# Patient Record
Sex: Female | Born: 1935 | Race: White | Hispanic: No | Marital: Married | Smoking: Never smoker
Health system: Southern US, Community
[De-identification: ages and names within clinical notes are randomized; demographics above are authoritative.]

## PROBLEM LIST (undated history)

## (undated) DIAGNOSIS — G039 Meningitis, unspecified: Secondary | ICD-10-CM

## (undated) DIAGNOSIS — I10 Essential (primary) hypertension: Secondary | ICD-10-CM

## (undated) DIAGNOSIS — R413 Other amnesia: Secondary | ICD-10-CM

## (undated) DIAGNOSIS — R001 Bradycardia, unspecified: Secondary | ICD-10-CM

## (undated) DIAGNOSIS — I493 Ventricular premature depolarization: Secondary | ICD-10-CM

## (undated) DIAGNOSIS — K219 Gastro-esophageal reflux disease without esophagitis: Secondary | ICD-10-CM

## (undated) DIAGNOSIS — E539 Vitamin B deficiency, unspecified: Secondary | ICD-10-CM

## (undated) DIAGNOSIS — D509 Iron deficiency anemia, unspecified: Secondary | ICD-10-CM

## (undated) DIAGNOSIS — R7303 Prediabetes: Secondary | ICD-10-CM

## (undated) DIAGNOSIS — E876 Hypokalemia: Secondary | ICD-10-CM

## (undated) DIAGNOSIS — Z789 Other specified health status: Secondary | ICD-10-CM

## (undated) DIAGNOSIS — M1990 Unspecified osteoarthritis, unspecified site: Secondary | ICD-10-CM

## (undated) HISTORY — DX: Gastro-esophageal reflux disease without esophagitis: K21.9

## (undated) HISTORY — PX: CHOLECYSTECTOMY: SHX55

## (undated) HISTORY — DX: Iron deficiency anemia, unspecified: D50.9

## (undated) HISTORY — DX: Ventricular premature depolarization: I49.3

## (undated) HISTORY — PX: ABDOMINAL HYSTERECTOMY: SHX81

## (undated) HISTORY — DX: Essential (primary) hypertension: I10

## (undated) HISTORY — DX: Prediabetes: R73.03

## (undated) HISTORY — DX: Hypokalemia: E87.6

## (undated) HISTORY — DX: Vitamin B deficiency, unspecified: E53.9

## (undated) HISTORY — PX: JOINT REPLACEMENT: SHX530

## (undated) HISTORY — PX: NEPHROSTOMY: SHX1014

## (undated) HISTORY — PX: TONSILLECTOMY: SUR1361

## (undated) HISTORY — DX: Unspecified osteoarthritis, unspecified site: M19.90

## (undated) HISTORY — PX: BREAST SURGERY: SHX581

## (undated) HISTORY — PX: TOTAL ABDOMINAL HYSTERECTOMY W/ BILATERAL SALPINGOOPHORECTOMY: SHX83

## (undated) HISTORY — DX: Bradycardia, unspecified: R00.1

---

## 1999-03-18 ENCOUNTER — Emergency Department (HOSPITAL_COMMUNITY): Admission: EM | Admit: 1999-03-18 | Discharge: 1999-03-18 | Payer: Self-pay | Admitting: Emergency Medicine

## 1999-03-18 ENCOUNTER — Encounter: Payer: Self-pay | Admitting: Emergency Medicine

## 2002-09-07 ENCOUNTER — Encounter: Payer: Self-pay | Admitting: Orthopaedic Surgery

## 2002-09-10 ENCOUNTER — Inpatient Hospital Stay (HOSPITAL_COMMUNITY): Admission: RE | Admit: 2002-09-10 | Discharge: 2002-09-14 | Payer: Self-pay | Admitting: Orthopaedic Surgery

## 2009-01-13 ENCOUNTER — Encounter (HOSPITAL_COMMUNITY): Admission: RE | Admit: 2009-01-13 | Discharge: 2009-04-11 | Payer: Self-pay | Admitting: Rheumatology

## 2009-04-12 ENCOUNTER — Encounter (HOSPITAL_COMMUNITY): Admission: RE | Admit: 2009-04-12 | Discharge: 2009-07-11 | Payer: Self-pay | Admitting: Rheumatology

## 2009-07-19 ENCOUNTER — Encounter (HOSPITAL_COMMUNITY): Admission: RE | Admit: 2009-07-19 | Discharge: 2009-10-05 | Payer: Self-pay | Admitting: Rheumatology

## 2009-10-03 ENCOUNTER — Encounter (HOSPITAL_COMMUNITY): Admission: RE | Admit: 2009-10-03 | Discharge: 2009-10-04 | Payer: Self-pay | Admitting: Rheumatology

## 2009-11-19 ENCOUNTER — Emergency Department (HOSPITAL_COMMUNITY): Admission: EM | Admit: 2009-11-19 | Discharge: 2009-11-19 | Payer: Self-pay | Admitting: Emergency Medicine

## 2009-11-22 ENCOUNTER — Emergency Department (HOSPITAL_BASED_OUTPATIENT_CLINIC_OR_DEPARTMENT_OTHER): Admission: EM | Admit: 2009-11-22 | Discharge: 2009-11-22 | Payer: Self-pay | Admitting: Emergency Medicine

## 2009-11-22 ENCOUNTER — Ambulatory Visit: Payer: Self-pay | Admitting: Diagnostic Radiology

## 2009-11-22 ENCOUNTER — Inpatient Hospital Stay (HOSPITAL_COMMUNITY): Admission: AD | Admit: 2009-11-22 | Discharge: 2009-11-24 | Payer: Self-pay

## 2009-11-28 ENCOUNTER — Inpatient Hospital Stay (HOSPITAL_COMMUNITY): Admission: EM | Admit: 2009-11-28 | Discharge: 2009-12-13 | Payer: Self-pay | Admitting: Emergency Medicine

## 2009-12-01 ENCOUNTER — Ambulatory Visit: Payer: Self-pay | Admitting: Vascular Surgery

## 2009-12-01 ENCOUNTER — Ambulatory Visit: Payer: Self-pay | Admitting: Infectious Disease

## 2009-12-01 ENCOUNTER — Encounter (INDEPENDENT_AMBULATORY_CARE_PROVIDER_SITE_OTHER): Payer: Self-pay | Admitting: Internal Medicine

## 2009-12-02 ENCOUNTER — Ambulatory Visit: Payer: Self-pay | Admitting: Physical Medicine & Rehabilitation

## 2009-12-02 ENCOUNTER — Encounter: Payer: Self-pay | Admitting: Infectious Disease

## 2009-12-04 ENCOUNTER — Encounter (INDEPENDENT_AMBULATORY_CARE_PROVIDER_SITE_OTHER): Payer: Self-pay | Admitting: Internal Medicine

## 2009-12-07 ENCOUNTER — Encounter: Payer: Self-pay | Admitting: Infectious Disease

## 2009-12-13 ENCOUNTER — Encounter: Payer: Self-pay | Admitting: Infectious Disease

## 2009-12-13 ENCOUNTER — Ambulatory Visit: Payer: Self-pay | Admitting: Physical Medicine & Rehabilitation

## 2009-12-13 ENCOUNTER — Inpatient Hospital Stay (HOSPITAL_COMMUNITY)
Admission: RE | Admit: 2009-12-13 | Discharge: 2009-12-30 | Payer: Self-pay | Admitting: Physical Medicine & Rehabilitation

## 2009-12-15 ENCOUNTER — Ambulatory Visit (HOSPITAL_COMMUNITY): Admission: RE | Admit: 2009-12-15 | Discharge: 2009-12-15 | Payer: Self-pay | Admitting: Infectious Disease

## 2009-12-24 ENCOUNTER — Encounter: Payer: Self-pay | Admitting: Physical Medicine & Rehabilitation

## 2009-12-26 ENCOUNTER — Ambulatory Visit: Payer: Self-pay | Admitting: Psychology

## 2009-12-30 ENCOUNTER — Ambulatory Visit: Payer: Self-pay | Admitting: Infectious Disease

## 2010-01-02 ENCOUNTER — Telehealth: Payer: Self-pay | Admitting: Infectious Disease

## 2010-01-02 DIAGNOSIS — G0490 Encephalitis and encephalomyelitis, unspecified: Secondary | ICD-10-CM | POA: Insufficient documentation

## 2010-01-02 DIAGNOSIS — G8190 Hemiplegia, unspecified affecting unspecified side: Secondary | ICD-10-CM | POA: Insufficient documentation

## 2010-01-02 DIAGNOSIS — G0491 Myelitis, unspecified: Secondary | ICD-10-CM

## 2010-01-02 DIAGNOSIS — G009 Bacterial meningitis, unspecified: Secondary | ICD-10-CM | POA: Insufficient documentation

## 2010-01-03 ENCOUNTER — Telehealth: Payer: Self-pay | Admitting: Infectious Disease

## 2010-01-04 ENCOUNTER — Inpatient Hospital Stay (HOSPITAL_COMMUNITY): Admission: AD | Admit: 2010-01-04 | Discharge: 2010-01-18 | Payer: Self-pay | Admitting: Internal Medicine

## 2010-01-04 ENCOUNTER — Ambulatory Visit: Payer: Self-pay | Admitting: Internal Medicine

## 2010-01-07 ENCOUNTER — Ambulatory Visit: Payer: Self-pay | Admitting: Infectious Diseases

## 2010-01-18 ENCOUNTER — Encounter: Payer: Self-pay | Admitting: Infectious Disease

## 2010-01-25 ENCOUNTER — Encounter (INDEPENDENT_AMBULATORY_CARE_PROVIDER_SITE_OTHER): Payer: Self-pay | Admitting: *Deleted

## 2010-01-25 DIAGNOSIS — K219 Gastro-esophageal reflux disease without esophagitis: Secondary | ICD-10-CM

## 2010-01-25 DIAGNOSIS — M1990 Unspecified osteoarthritis, unspecified site: Secondary | ICD-10-CM | POA: Insufficient documentation

## 2010-01-25 DIAGNOSIS — F329 Major depressive disorder, single episode, unspecified: Secondary | ICD-10-CM

## 2010-01-26 ENCOUNTER — Ambulatory Visit: Payer: Self-pay | Admitting: Infectious Disease

## 2010-01-26 DIAGNOSIS — F0150 Vascular dementia without behavioral disturbance: Secondary | ICD-10-CM

## 2010-03-20 ENCOUNTER — Ambulatory Visit: Payer: Self-pay | Admitting: Infectious Disease

## 2010-03-20 DIAGNOSIS — F068 Other specified mental disorders due to known physiological condition: Secondary | ICD-10-CM | POA: Insufficient documentation

## 2010-03-20 DIAGNOSIS — R609 Edema, unspecified: Secondary | ICD-10-CM | POA: Insufficient documentation

## 2010-05-30 ENCOUNTER — Encounter: Admission: RE | Admit: 2010-05-30 | Discharge: 2010-05-30 | Payer: Self-pay | Admitting: Internal Medicine

## 2010-06-12 ENCOUNTER — Encounter: Admission: RE | Admit: 2010-06-12 | Discharge: 2010-06-12 | Payer: Self-pay | Admitting: Internal Medicine

## 2010-06-12 ENCOUNTER — Ambulatory Visit: Payer: Self-pay | Admitting: Internal Medicine

## 2010-06-13 ENCOUNTER — Encounter: Admission: RE | Admit: 2010-06-13 | Discharge: 2010-06-13 | Payer: Self-pay | Admitting: Internal Medicine

## 2010-06-19 ENCOUNTER — Encounter: Admission: RE | Admit: 2010-06-19 | Discharge: 2010-06-19 | Payer: Self-pay | Admitting: Internal Medicine

## 2010-06-26 ENCOUNTER — Ambulatory Visit: Payer: Self-pay | Admitting: Internal Medicine

## 2010-07-03 ENCOUNTER — Encounter: Admission: RE | Admit: 2010-07-03 | Discharge: 2010-07-03 | Payer: Self-pay | Admitting: Internal Medicine

## 2010-08-08 ENCOUNTER — Ambulatory Visit: Payer: Self-pay | Admitting: Internal Medicine

## 2010-10-09 ENCOUNTER — Ambulatory Visit: Payer: Self-pay | Admitting: Internal Medicine

## 2010-12-17 ENCOUNTER — Encounter: Payer: Self-pay | Admitting: Infectious Disease

## 2010-12-26 NOTE — Letter (Signed)
Summary: DO Not Resuscitate Order  DO Not Resuscitate Order   Imported By: Florinda Marker 01/31/2010 14:53:52  _____________________________________________________________________  External Attachment:    Type:   Image     Comment:   External Document

## 2010-12-26 NOTE — Progress Notes (Signed)
Summary: Care Plan Oversight  Phone Note Outgoing Call   Call placed by: Acey Lav MD,  January 02, 2010 8:25 AM Details for Reason: Care Plan Oversight Summary of Call: [phone] (30 or more mins)  I have supervised home care and/or infusion therapy for this pt, including providing orders for care, review of labs and/or home health care plans, communicating with the home health care professionals and/or patient/caregivers to integrate current information into the medical treatment plan and/or adjust the medical therapy. This supervision has been provided for during the calendar month. Dates for this oversight _2/4/11 thru_ 01/29/10.   Initial call taken by: Acey Lav MD,  January 02, 2010 8:27 AM  New Problems: HEMIPARESIS, LEFT (ICD-342.90) ENCEPHALITIS (ICD-323.9) MENINGITIS DUE TO UNSPECIFIED BACTERIUM (ICD-320.9)   New Problems: HEMIPARESIS, LEFT (ICD-342.90) ENCEPHALITIS (ICD-323.9) MENINGITIS DUE TO UNSPECIFIED BACTERIUM (ICD-320.9)

## 2010-12-26 NOTE — Miscellaneous (Signed)
Summary: Appointment Canceled  Appointment status changed to canceled by [employer] on 02/23/2010 1:34 PM.  Cancellation Comments --------------------- 1 MONTH RECHECK/CH  Appointment Information ----------------------- Appt Type:  ID OFFICE VISIT      Date:  Thursday, March 02, 2010      Time:  10:30 AM for 15 min   Urgency:  Routine   Made By:  Pearson Grippe  To Visit:  [id number]    Reason:  1 MONTH RECHECK/CH  Appt Comments ------------- -- 02/23/10 13:34: ([facility]) CANCELED -- 1 MONTH RECHECK/CH -- 02/16/10 15:59: ([facility]) BOOKED -- Routine ID OFFICE VISIT at 03/02/2010 10:30 AM for 15 min 1 MONTH RECHECK/CH

## 2010-12-26 NOTE — Letter (Signed)
Summary: SPECIMEN INFORMATION/ [facility]  SPECIMEN INFORMATION/ [facility]   Imported By: Margie Billet 02/10/2010 15:14:50  _____________________________________________________________________  External Attachment:    Type:   Image     Comment:   External Document

## 2010-12-26 NOTE — Assessment & Plan Note (Signed)
Summary: 1 MONTH RECHECK/CH   Visit Type:  Follow-up  CC:  1 month f/u.  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 She was dc to home but then readmitted on Dec 28thwith with confusion and given decadron, vancomcyin and rocephin. Neurology were consulted and felt that she was not in need of therapy for bacterial meningitis given her CSF profile and she had antibiotics stopped. In ther interim she had improved and was dc to home. She was then readmitted on the 3rd of January with florrid encephalopathy and given acyclovir for possible HSV and low dose 1g rocephin to cover an enterobacter from her urine. She again improved and I was asked to see her on ID consults. I took her off of her rocephin and within the next 3 days she abruptly deteriorated becoming hemiplegic on the left side. I performed repeat LP which again showed a lymphocytic pleocytosis. I send CSF for culture, AFB, fungal, repeat HSV, VZV (were also tested on the 25th fluid), crypto ag, cocci ag, abs, histo ag, abs, blasto abs, TB PCR, enterovirus PCR, all of which were ultimately negative. I started her back on high dose rocephin 2g q12, vancomycin, ampicillin (for listeria) but she was still not improving at 24 hour mark and we had feedign tube placed and began her on 4 drugs for TB, decadron and high dose fluconazole. Within the next 24 hrs she regained strength in her flaccid left arm and began to improve dramatically and ultimately was dc to rehab floor whre she completed  planned 2 wks of vancomycin, and rocephin and planned 3 wks of ampicillin, along with continue rx for TB and possible fungal meningitis. In interim serial MRIs continued to show worsening leptomeningeal enhancement and I was discouraged and dissuaded from narrowing her  empiric therapy and instead prolonged her antibacterial therapy changint to merpenem and vancomycin with continued rx for tb, decadronawhile reinstution of her fluconazole. I consulted with Dr. Anne Hahn from Neurology and Dr. Jule Ser from Neurosurgeyr and we contemplated brain biopsy but ultimatedly decided against this. I readmitted the pt to the B service and followed her closely also with Dr. Sampson Goon once agin with Neurology and Neurosurgery. Her MRIs continued to worsen but she did not worsen Neurologically. Ultimately she was taken off of antibacterial and then anti TB therapy (with fluconazole). She continued to have short term memory problems and did suffer from sever depression. We again contemplated brain biopsy but given that she had not deteriorated Neurologically and when family wished not to proceed missed  we decided instead to observe her with tapering of her steroids--which she is completely off of now.. I saw her in March and am now seeing her in followup in April. Since I last saw her her strength hasa continued to improve and she is able to stand with assitance of railing and walk with walker. She continues to have troubles with short term memory but her mood has improved dramatically. She is being managed for LE edema with lasix. She has noticed a faint pink rash on her arms that is not pruritic in nature. She denies nausea, abdominal pain, vomiting, light headedness dizziness. She is at  [employer]   Current Allergies (reviewed today): ! PCN Past History:  Past Medical History: Meningoencephalitis of unknown  cause Depression Hypothyroidism Lower extremity edema  Past Surgical History: Reviewed history from 01/26/2010 and no changes required. none  Family History: Reviewed history from 01/26/2010 and no changes required. noncontributory  Social History: Reviewed history from 01/26/2010 and no changes required. lives in [place]. Married. Husband and daugher  are both very attentive and involved. Pt was prevously highly functioning and cared for her grandchildren actively. She is nonsmoker,nondrinker  Review of Systems       The patient complains of suspicious skin lesions.  The patient denies anorexia, fever, weight loss, weight gain, vision loss, decreased hearing, hoarseness, chest pain, syncope, dyspnea on exertion, peripheral edema, prolonged cough, headaches, hemoptysis, abdominal pain, melena, hematochezia, severe indigestion/heartburn, hematuria, incontinence, genital sores, muscle weakness, transient blindness, difficulty walking, depression, unusual weight change, abnormal bleeding, and enlarged lymph nodes.    Vital Signs:  Patient profile:   77 year old female Temp:     97.4 degrees F oral Pulse rate:   76 / minute BP sitting:   124 / 75  (right arm)  Vitals Entered By: Starleen Arms CMA (March 20, 2010 10:34 AM) CC: 1 month f/u Is Patient Diabetic? No Pain Assessment Patient in pain? no      Nutritional Status Detail nl  Does patient need assistance? Functional Status Cook/clean, Shopping, Social activities Ambulation Impaired:Risk for fall, Wheelchair   Physical Exam  General:  alert, well-nourished, and well-hydrated.   Head:  normocephalic, atraumatic, no abnormalities observed, and no abnormalities palpated.   Eyes:  vision grossly intact, pupils equal, pupils round, and pupils reactive to light.   Ears:  no external deformities and ear piercing(s) noted.   Nose:  no external deformity, and no external erythema.   Mouth:  pharynx pink and moist, no erythema, and no exudates.   Neck:  supple and full ROM.   Lungs:  normal respiratory effort, no intercostal retractions, no accessory muscle use, and normal breath sounds.   Heart:  normal rate, regular rhythm, no murmur, and no gallop.   Abdomen:  soft, non-tender, normal bowel sounds, no distention, and no masses.   Msk:  normal ROM and no joint deformities.     Extremities:  legs with tet hose Neurologic:  CN grossly intact as is cerebellar fxn. her motor strength continues to improve and is 5/5 bilateraly in upper extremities though weaker on the left, similarly with simply flexion, extension about knee. Gait not tested. MMSE not performed today. She scored 19/30 at last check Skin:  she has faint pink macules on her arms Psych:  oriented to person, place, in good spirits today   Impression & Recommendations:  Problem # 1:  MENINGITIS DUE TO UNSPECIFIED BACTERIUM (ICD-320.9)  Unclear what the cause of this was, but my suspicion is for a noninfectious autoimmune process perhaps in rxn to antigen from viral infection. Hopefully this is a monophoasic event, but she is still taking time to recover and may have permanent deficits. She needs fu with Neurology/ She received the most aggressive antibacterial coverage possible, along with antifungal and ant TB drugs and steroids.  Orders: Est. Patient Level IV (16109)  Problem # 2:  ENCEPHALITIS (ICD-323.9)  see above discussion  Orders: Est. Patient Level IV (60454)  Problem # 3:  DEMENTIA (ICD-294.8)  due to her CNS inflammation in December, continues to reside and rehabilitate in SNF  Orders: Est. Patient Level IV (09811)  Problem # 4:  DEPRESSION (ICD-311) much improved Her updated medication list for this problem includes:  Trazodone Hcl 50 Mg Tabs (Trazodone hcl) .Marland Kitchen... Take one-half to one tablet by mouth nightly as needed for insomnia  per hospital discharge info    Venlafaxine Hcl 37.5 Mg Tabs (Venlafaxine hcl) .Marland Kitchen... Take 1 capsule by mouth at bedtime  per hospital discharge info  Problem # 5:  HEMIPARESIS, LEFT (ICD-342.90)  due to numbers ! and 2, much improved but still needing walker  Orders: Est. Patient Level IV (16109)  Problem # 6:  EDEMA (ICD-782.3)  on lasix Her updated medication list for this problem includes:    Furosemide 40 Mg Tabs (Furosemide) .Marland Kitchen... 1 once  daily  Orders: Est. Patient Level IV (60454)  Medications Added to Medication List This Visit: 1)  Furosemide 40 Mg Tabs (Furosemide) .Marland Kitchen.. 1 once daily 2)  Prilosec 20 Mg Cpdr (Omeprazole) .Marland Kitchen.. 1 once daily 3)  Potassium Chloride 20 Meq Pack (Potassium chloride) .Marland Kitchen.. 1 tablet two times a day  Patient Instructions: 1)  rtc to see Dr. Daiva Eves in August

## 2010-12-26 NOTE — Progress Notes (Signed)
Summary: phone note-TY  Phone Note Call from Patient   Caller: Spouse Call For: Dr.  Summary of Call: Patient's husband is very concerned about the amount of abx and other meds that she has to take, and he is unable to get her to take some of the medicines. Mr. Soltero wants Dr.Van Dam to call him if possible. He has an appt on this Friday 2/11, but he wants to express his concerns before the appt.  Initial call taken by: Starleen Arms CMA,  January 03, 2010 4:20 PM  Follow-up for Phone Call        He paged me right now and we talked extensively. Soundsl like th ept may be dehydrated and the husband is overwhelmed with giving antibiotics 5 times a day by IV along with 4 tb drugs, decadron, fluconazole and her other meds. We will admit her to the teaching service, hydrate her check her labs, continue her antibiotics. I will let Dr. Sampson Goon know about her as well. We will also plan on placing pt in SNF until we can finish her IV antiobitoics. My plan was to NOT stop them until we had an MRI that showed improvment and my plan was to gt that MRI  4 wks from last one unless [facility] felt need for repeat imaging sooner.  Follow-up by: Acey Lav MD,  January 04, 2010 10:17 AM

## 2010-12-26 NOTE — Assessment & Plan Note (Signed)
Summary: hsfu need chart/kam meningoencephalitis   CC:  HFU   .  History of Present Illness: 77 year old lady with comlex story. 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 She was dc to home but then readmitted on Dec 28thwith with confusion and given decadron, vancomcyin and rocephin. Neurology were consulted and felt that she was not in need of therapy for bacterial meningitis given her CSF profile and she had antibiotics stopped. In ther interim she had improved and was dc to home. She was then readmitted on the 3rd of January with florrid encephalopathy and given acyclovir for possible HSV and low dose 1g rocephin to cover an enterobacter from her urine. She again improved and I was asked to see her on ID consults. I took her off of her rocephin and within the next 3 days she abruptly deteriorated becoming hemiplegic on the left side. I performed repeat LP which again showed a lymphocytic pleocytosis. I send CSF for culture, AFB, fungal, repeat HSV, VZV (were also tested on the 25th fluid), crypto ag, cocci ag, abs, histo ag, abs, blasto abs, TB PCR, enterovirus PCR, all of which were ultimately negative. I started her back on high dose rocephin 2g q12, vancomycin, ampicillin (for listeria) but she was still not improving at 24 hour mark and we had feedign tube placed and began her on 4 drugs for TB, decadron and high dose fluconazole. Within the next 24 hrs she regained strength in her flaccid left arm and began to improve dramatically and ultimately was dc to rehab floor whre she completed  planned 2 wks of vancomycin, and rocephin and planned 3 wks of ampicillin, along with continue rx for TB and possible fungal meningitis. In interim serial MRIs continued to show worsening leptomeningeal enhancement and I was discouraged and dissuaded from  narrowing her empiric therapy and instead prolonged her antibacterial therapy changint to merpenem and vancomycin with continued rx for tb, decadron and for awhile reinstution of her fluconazole. I consulted with Dr. Anne Hahn from Neurology and Dr. Jule Ser from Neurosurgeyr and we contemplated brain biopsy but ultimatedly decided against this. I readmitted the pt to the B service and followed her closely also with Dr. Sampson Goon once agin with Neurology and Neurosurgery. Her MRIs continued to worsen but she did not worsen Neurologically. Ultimately she was taken off of antibacterial and then anti TB therapy (with fluconazole). She continued to have short term memory problems and did suffer from sever depression. We again contemplated brain biopsy but given that she had not deteriorated Neurologically and when family wished not to proceed missed  we decided instead to observe her with tapering of her steroids. She was dc to SNF and returns for followup today. She is residing in the [facility] fax [phone]  Preventive Screening-Counseling & Management  Alcohol-Tobacco     Alcohol drinks/day: 0     Smoking Status: never  Caffeine-Diet-Exercise     Caffeine use/day: 2 sodas daily    Current Allergies (reviewed today): ! PCN Past History:  Past Medical History: Meningoencephalitis of unknown cause Depression Hypothyroidism  Past Surgical History: none  Family History: noncontributory  Social History: lives in [place]. Married. Husband and daugher are both very attentive and involved. Pt was prevously highly functioning and cared for her grandchildren actively. She is nonsmoker,nondrinker  Review of Systems  The patient complains of muscle weakness, difficulty walking, and depression.  The patient denies anorexia, fever, weight loss, weight gain, vision loss, decreased hearing, hoarseness, chest pain, syncope, dyspnea on exertion, peripheral edema, prolonged cough, headaches, hemoptysis,  abdominal pain, melena, hematochezia, severe indigestion/heartburn, hematuria, incontinence, genital sores, suspicious skin lesions, transient blindness, unusual weight change, abnormal bleeding, and enlarged lymph nodes.    Vital Signs:  Patient profile:   77 year old female Height:      56 inches Weight:      161 pounds BMI:     36.23 BSA:     1.62 Temp:     97.5 degrees F oral Pulse rate:   61 / minute BP sitting:   147 / 84  (right arm)  Vitals Entered By: Tomasita Morrow RN (January 26, 2010 2:12 PM) CC: HFU    Is Patient Diabetic? No Pain Assessment Patient in pain? no      Nutritional Status BMI of 25 - 29 = overweight Nutritional Status Detail normal  Have you ever been in a relationship where you felt threatened, hurt or afraid?No   Does patient need assistance? Functional Status Self care Ambulation Normal   Physical Exam  General:  alert, well-nourished, and well-hydrated.   Head:  normocephalic, atraumatic, no abnormalities observed, and no abnormalities palpated.   Eyes:  vision grossly intact, pupils equal, pupils round, and pupils reactive to light.   Ears:  no external deformities and ear piercing(s) noted.   Nose:  no external deformity, nose piercing noted, and no external erythema.   Mouth:  pharynx pink and moist, no erythema, and no exudates.   Neck:  supple and full ROM.   Lungs:  normal respiratory effort, no intercostal retractions, no accessory muscle use, and normal breath sounds.   Heart:  normal rate, regular rhythm, no murmur, and no gallop.   Abdomen:  soft, non-tender, normal bowel sounds, no distention, and no masses.   Msk:  normal ROM and no joint deformities.   Extremities:  trace left pedal edema and trace right pedal edema.   Neurologic:  CN grossly intact as is cerebellar fxn. her motor strength is reduced in the LUE, LLE 4/5 but much stronger than when I last examined her. ON MMSE  19/30  she knew state but not city, building, floor, or  room, she knew year and month but missed date, missed 3 objects on recall, could not spell world backwardss   Impression & Recommendations:  Problem # 1:  MENINGITIS DUE TO UNSPECIFIED BACTERIUM (ICD-320.9) It has never been clear what the cause of this was. My best guess is that it was largely an auto-immune reaction possibly triggered by a viral infection. Her initial cultures were sterile. 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 HEr HSV, VZV pcrs were negative too. Her RPR and HIV were also of note negative. Her fungal and AFB cultures TB PCR and fungal serologies were all negative. Her CSF prfile DID improve with steroids and antiboitcs with CSF WBC normalizing as did her protein level. Her CSF cytology was negative for malignancy. We will observe her with steroid taper keeping in mind chance for recurrence of ssx off of steroids. If this occurs will pursue brain biopsy. she needs fu appt with [facility] Her updated medication list for this problem includes:    Fluconazole 100 Mg Tabs (Fluconazole) .Marland Kitchen... Take 1 tablet by mouth once a day for 13  days  per hospital discharge info  Orders: Neurology Referral (Neuro) Est. Patient Level IV (40981)  Problem # 2:  ENCEPHALITIS (ICD-323.9) see above, again cause never ascertained, plan as above for #1 Orders: Neurology Referral (Neuro) Est. Patient Level IV (19147)  Problem # 3:  VASCULAR DEMENTIA (ICD-290.40)  Likely multifactorial but with the multiple infarcts that occurred with her meningoencephalitis this is also likely largely a result of her recent meningoencephalitis. Her main issues remain short term memory and depression. Her hemiplegia is improving. She is on antidepressant and in PT.   Orders: Est. Patient Level IV (82956)  Problem # 4:  HEMIPARESIS, LEFT (ICD-342.90) markedly improved but still with residual hemiparesis Orders: Neurology  Referral (Neuro) Est. Patient Level IV (21308)  Problem # 5:  DEPRESSION (ICD-311)  a predominant component of her current problems and complicated by her awarness of her cognitive deficits. Her updated medication list for this problem includes:    Trazodone Hcl 50 Mg Tabs (Trazodone hcl) .Marland Kitchen... Take one-half to one tablet by mouth nightly as needed for insomnia  per hospital discharge info    Venlafaxine Hcl 37.5 Mg Tabs (Venlafaxine hcl) .Marland Kitchen... Take 1 capsule by mouth at bedtime  per hospital discharge info  Orders: Est. Patient Level IV (65784)  Patient Instructions: 1)  rtc to see Dr. Daiva Eves  in early April

## 2010-12-26 NOTE — Miscellaneous (Signed)
Summary: Problems, Medications and Allergies  Clinical Lists Changes  Problems: Added new problem of DEPRESSION (ICD-311) Added new problem of GERD (ICD-530.81) Added new problem of DEGENERATIVE JOINT DISEASE (ICD-715.90) Medications: Added new medication of DEXAMETHASONE 2 MG TABS (DEXAMETHASONE) Take 2 tablets by mouth two times a day  per Hospital Discharge info.  Then, Take 1 tablet by mouth once a day from 02/02/2010 to 02/08/2010  per Hospital Discharge info. Added new medication of BENADRYL MAXIMUM STRENGTH 2 % CREA (DIPHENHYDRAMINE HCL) Apply to affected area 2 to 4 times a day as needed for rash and alternated with the application of Nystatin cream per Hospital Discharge info Added new medication of FLUCONAZOLE 100 MG TABS (FLUCONAZOLE) Take 1 tablet by mouth once a day for 13 days  per Hospital Discharge info Added new medication of NOVOLIN R 100 UNIT/ML SOLN (INSULIN REGULAR HUMAN) Sliding scale insulin per protocol  per Hospital Discharge info Added new medication of LIDODERM 5 % PTCH (LIDOCAINE) Apply one patch topically every 24 hours as needed  per Hospital Discharge info Added new medication of CARAFATE 1 GM TABS (SUCRALFATE) Take 1 tablet by mouth two times a day before meals.  Stop medication after completion of steriod taper  per Hospital Discharge info Added new medication of TRAZODONE HCL 50 MG TABS (TRAZODONE HCL) Take one-half to one tablet by mouth nightly as needed for insomnia  per Hospital Discharge info Added new medication of VENLAFAXINE HCL 37.5 MG TABS (VENLAFAXINE HCL) Take 1 capsule by mouth at bedtime  per Hospital Discharge info Added new medication of APAP 325 MG TABS (ACETAMINOPHEN) Take 1-2 tablets by mouth every 4 hours pain and fever per Hospital Discharge info Added new medication of MAALOX REGULAR STRENGTH 225-200-25 MG/5ML SUSP (ALUM & MAG HYDROXIDE-SIMETH) Drink 30 mL by mouth every 4 hours as needed Added new medication of NEXIUM 40 MG CPDR (ESOMEPRAZOLE  MAGNESIUM) Take 1 tablet by mouth once a day  per Hospital Discharge info Added new medication of CETIRIZINE HCL 10 MG TABS (CETIRIZINE HCL) Take 1 tablet by mouth once a day  per Hospital Discharge info Allergies: Added new allergy or adverse reaction of PCN Observations: Added new observation of NKA: F (01/25/2010 16:40)

## 2011-01-15 IMAGING — RF IR DG VERTEBROPLASTY FL
14 series · 14 of 14 positions shown · non-contrast
Comparison: none

CLINICAL DATA: Osteoporotic compression fracture at L4.
Persistent pain limiting the patients ability to perform activities
of daily living as detailed in the evaluation notes.

[Series 1: (hospital) · 1 of 1 slices shown (1 of 5)]
[im 1/1]
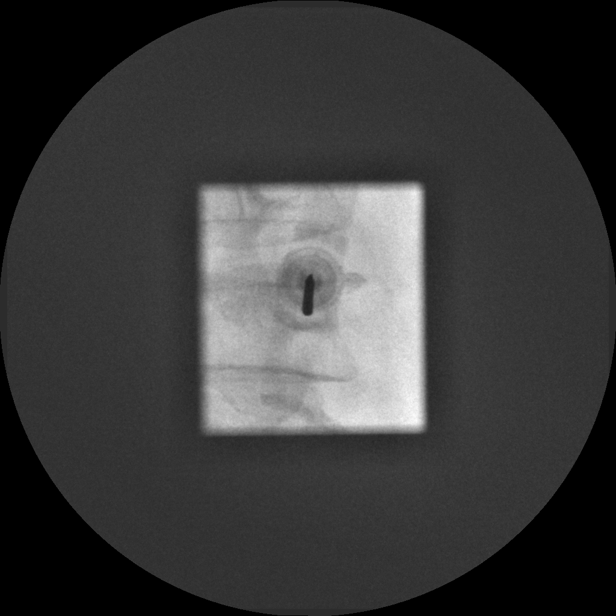

[Series 2: (hospital) · 1 of 1 slices shown (2 of 5)]
[im 1/1]
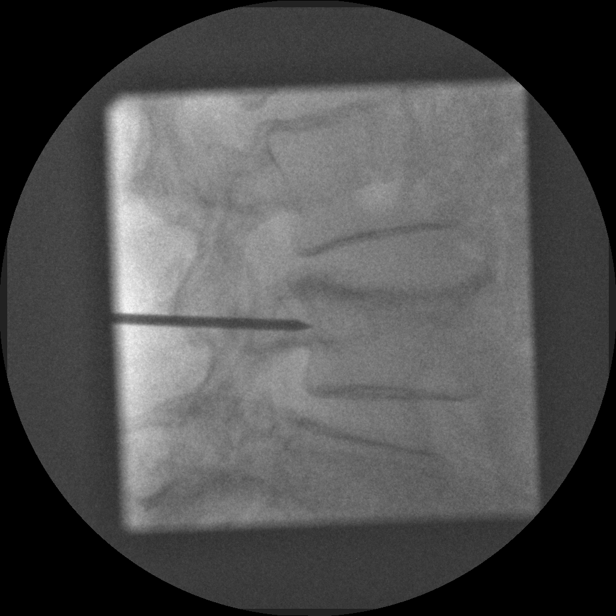

[Series 3: (hospital) · 1 of 1 slices shown (3 of 5)]
[im 1/1]
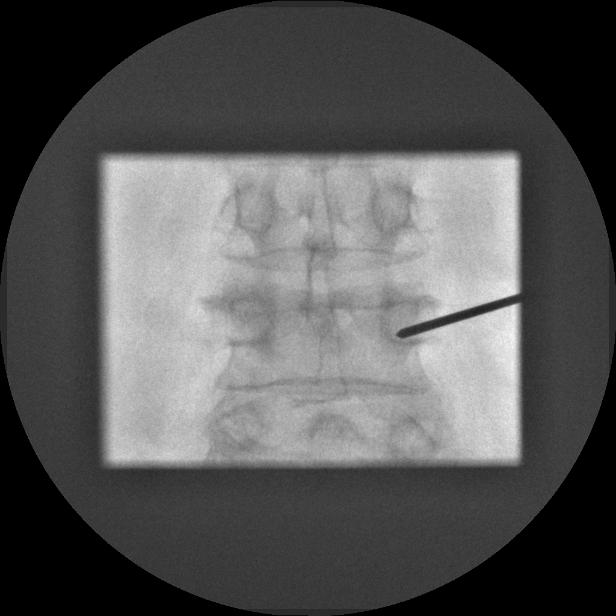

[Series 4: (hospital) · 1 of 1 slices shown (4 of 5)]
[im 1/1]
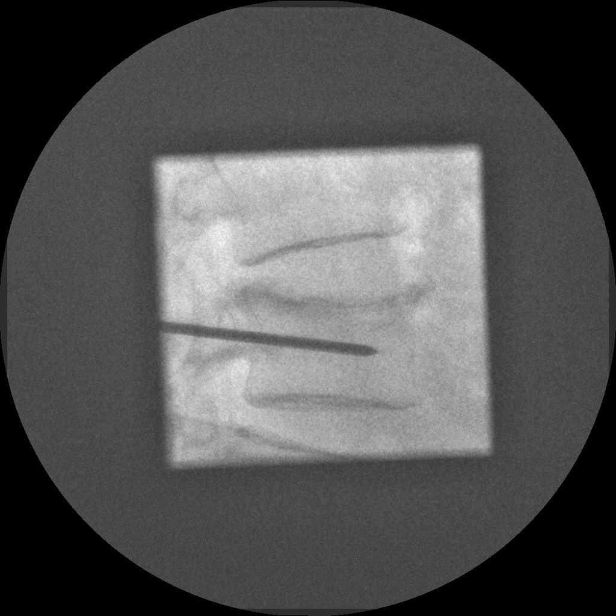

[Series 5: (hospital) · 1 of 1 slices shown (5 of 5)]
[im 1/1]
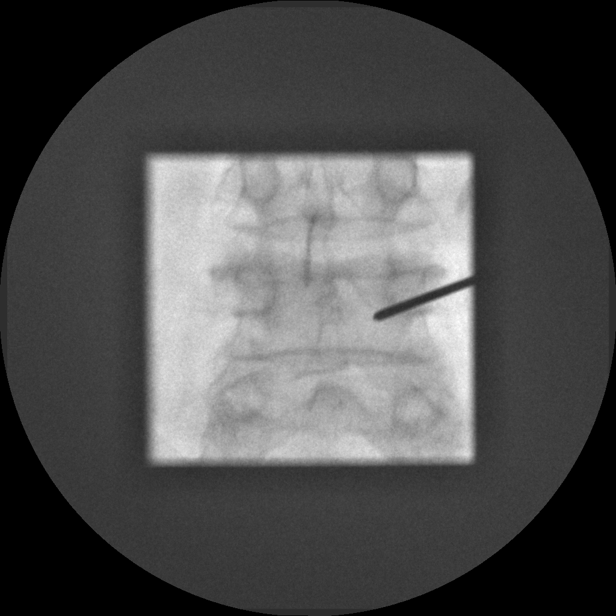

[Series 6: vertebro  plasty · 1 of 1 slices shown (1 of 9)]
[im 1/1]
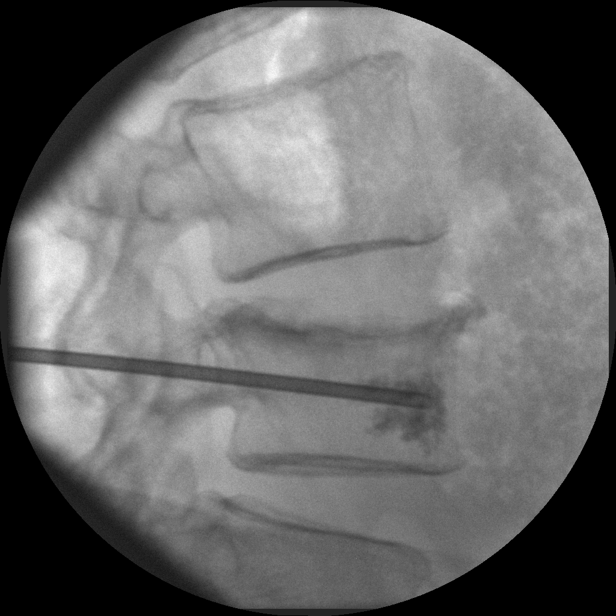

[Series 7: vertebro  plasty · 1 of 1 slices shown (2 of 9)]
[im 1/1]
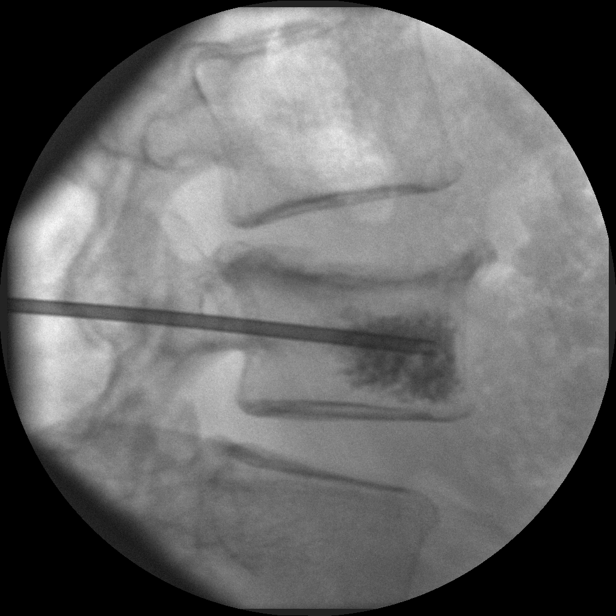

[Series 8: vertebro  plasty · 1 of 1 slices shown (3 of 9)]
[im 1/1]
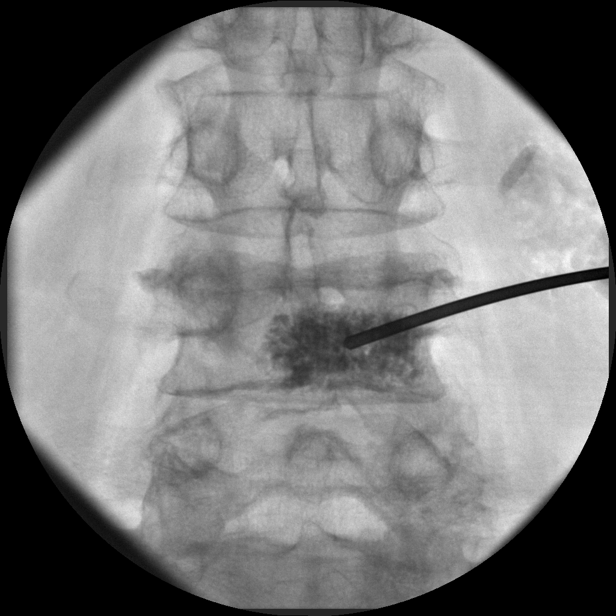

[Series 9: vertebro  plasty · 1 of 1 slices shown (4 of 9)]
[im 1/1]
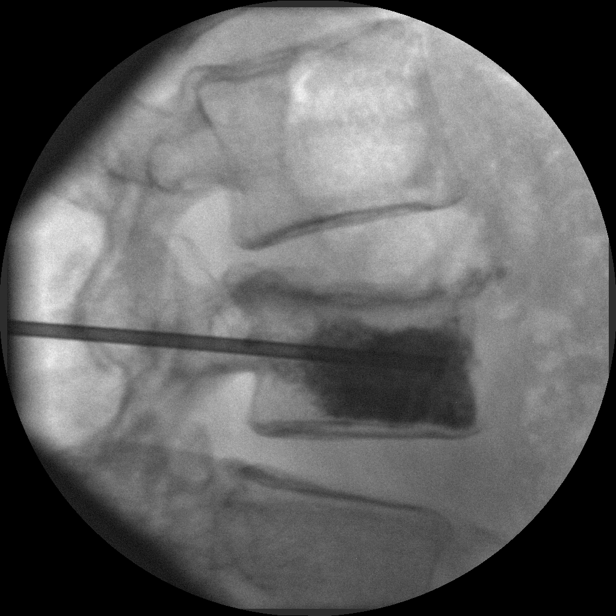

[Series 10: vertebro  plasty · 1 of 1 slices shown (5 of 9)]
[im 1/1]
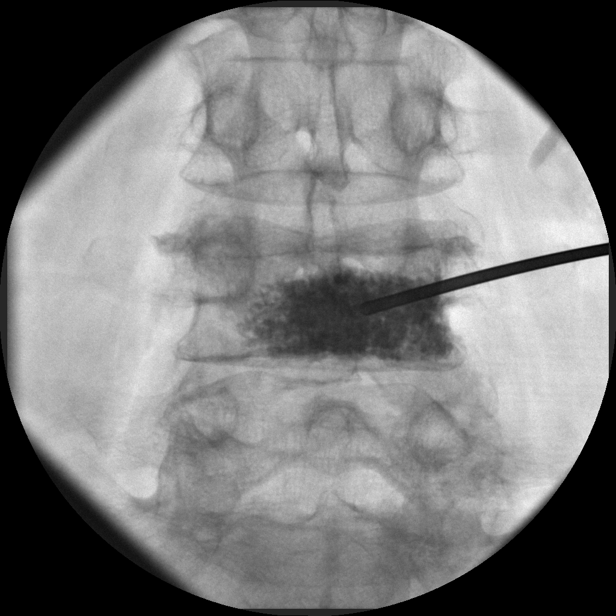

[Series 11: vertebro  plasty · 1 of 1 slices shown (6 of 9)]
[im 1/1]
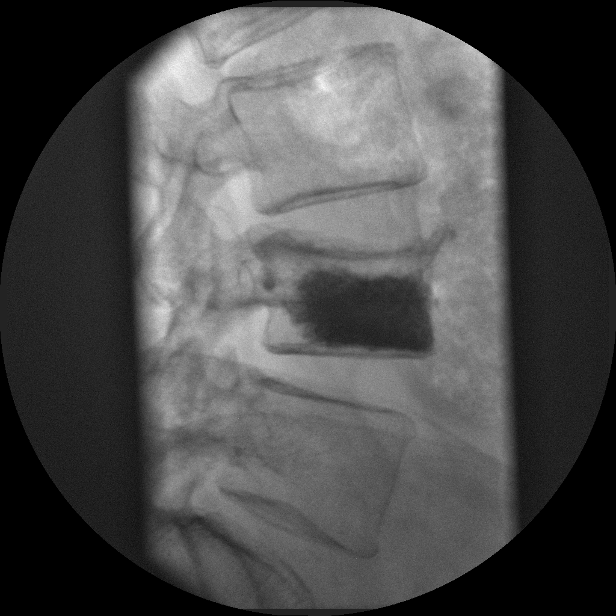

[Series 12: vertebro  plasty · 1 of 1 slices shown (7 of 9)]
[im 1/1]
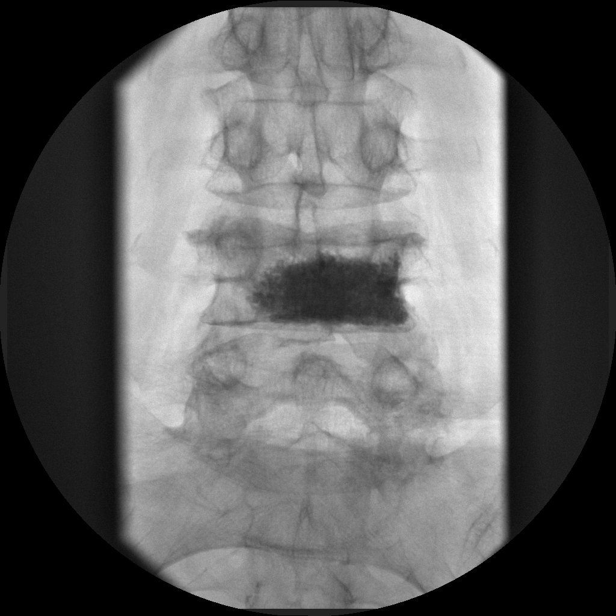

[Series 13: vertebro  plasty · 1 of 1 slices shown (8 of 9)]
[im 1/1]
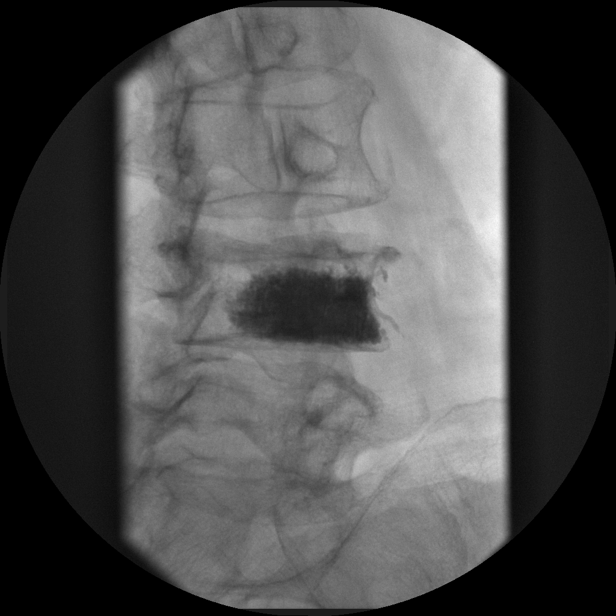

[Series 14: vertebro  plasty · 1 of 1 slices shown (9 of 9)]
[im 1/1]
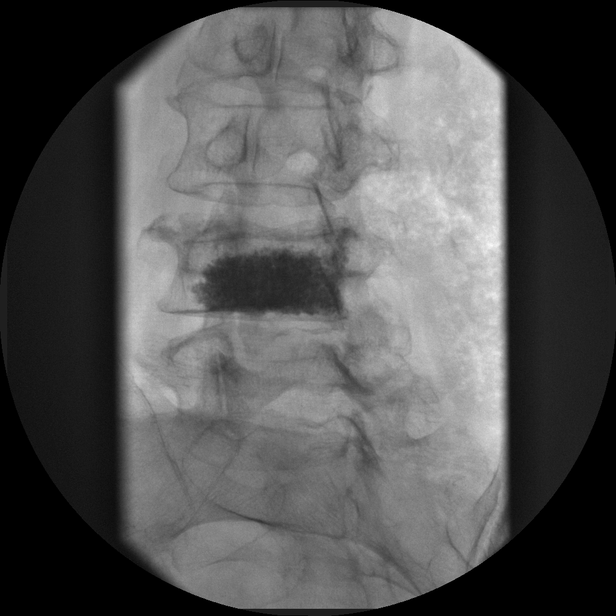

[14 of 14 positions shown; findings below may reference images not displayed]

L4 VERTEBROPLASTY:
Operator:  Dr. Vann
Medications utilized:  Versed 2.0 mg IV, Fentanyl 125 mcg IVToradol
30 mg gram IV.  Vancomycin and 1 gram IV was given prior to the
procedure for antibiotic prophylaxis.
Following a full explanation of the procedure along with the
potentially associated complications, an informed witnessed consent
was obtained.
The patient was positioned prone on the fluoroscopic table.   The
skin was prepped and draped in the usual sterile fashion.  The L4
vertebral body was identified and the right superficial soft
tissues were anesthetized with 1% lidocaine to the level of the
pedicle.  A small skin incision was made.    A 13 gauge bone needle
was then advanced through the pedicle into the anterior one-third
of the vertebral body.
At this time, methylmethacrylate mixture was reconstituted.  Using
intermittent fluoroscopy, the methylmethacrylate mixture was then
injected into the L4 vertebral body.
An excellent trabecular pattern was achieved extending from the
superior endplate compression fracture to the inferior endplate.
The methylmethacrylate crossed midline on the AP view.  There is
minimal extrusion into the right paravertebral vein.  No
significant anterior or posterior extrusion is present.  There is
no disc contamination.
The needle was then retrieved and removed.  Hemostasis was achieved
at the skin entry site.
There were no acute complications.  Patient tolerated the procedure
well.
IMPRESSION: Technically successful unipedicular L4 vertebroplasty.

## 2011-02-11 LAB — BASIC METABOLIC PANEL
BUN: 14 mg/dL (ref 6–23)
BUN: 15 mg/dL (ref 6–23)
BUN: 31 mg/dL — ABNORMAL HIGH (ref 6–23)
BUN: 7 mg/dL (ref 6–23)
BUN: 9 mg/dL (ref 6–23)
BUN: 17 mg/dL (ref 6–23)
BUN: 22 mg/dL (ref 6–23)
BUN: 23 mg/dL (ref 6–23)
BUN: 25 mg/dL — ABNORMAL HIGH (ref 6–23)
BUN: 27 mg/dL — ABNORMAL HIGH (ref 6–23)
CO2: 24 mEq/L (ref 19–32)
CO2: 24 mEq/L (ref 19–32)
CO2: 25 mEq/L (ref 19–32)
CO2: 27 mEq/L (ref 19–32)
CO2: 24 mEq/L (ref 19–32)
CO2: 25 mEq/L (ref 19–32)
CO2: 25 mEq/L (ref 19–32)
Calcium: 7.9 mg/dL — ABNORMAL LOW (ref 8.4–10.5)
Calcium: 8.3 mg/dL — ABNORMAL LOW (ref 8.4–10.5)
Chloride: 100 mEq/L (ref 96–112)
Chloride: 101 mEq/L (ref 96–112)
Chloride: 104 mEq/L (ref 96–112)
Chloride: 106 mEq/L (ref 96–112)
Chloride: 106 mEq/L (ref 96–112)
Chloride: 107 mEq/L (ref 96–112)
Chloride: 100 mEq/L (ref 96–112)
Chloride: 103 mEq/L (ref 96–112)
Chloride: 105 mEq/L (ref 96–112)
Chloride: 106 mEq/L (ref 96–112)
Chloride: 107 mEq/L (ref 96–112)
Creatinine, Ser: 0.81 mg/dL (ref 0.4–1.2)
Creatinine, Ser: 0.85 mg/dL (ref 0.4–1.2)
Creatinine, Ser: 0.85 mg/dL (ref 0.4–1.2)
Creatinine, Ser: 0.67 mg/dL (ref 0.4–1.2)
Creatinine, Ser: 0.7 mg/dL (ref 0.4–1.2)
Creatinine, Ser: 0.7 mg/dL (ref 0.4–1.2)
Creatinine, Ser: 0.76 mg/dL (ref 0.4–1.2)
Creatinine, Ser: 0.77 mg/dL (ref 0.4–1.2)
GFR calc Af Amer: >60 mL/min (ref >60)
GFR calc Af Amer: >60 mL/min (ref >60)
GFR calc Af Amer: >60 mL/min (ref >60)
GFR calc Af Amer: >60 mL/min (ref >60)
GFR calc non Af Amer: 58 mL/min — ABNORMAL LOW (ref >60)
GFR calc non Af Amer: >60 mL/min (ref >60)
GFR calc non Af Amer: >60 mL/min (ref >60)
GFR calc non Af Amer: 51 mL/min — ABNORMAL LOW (ref >60)
GFR calc non Af Amer: >60 mL/min (ref >60)
GFR calc non Af Amer: >60 mL/min (ref >60)
GFR calc non Af Amer: >60 mL/min (ref >60)
Glucose, Bld: 100 mg/dL — ABNORMAL HIGH (ref 70–99)
Glucose, Bld: 103 mg/dL — ABNORMAL HIGH (ref 70–99)
Glucose, Bld: 105 mg/dL — ABNORMAL HIGH (ref 70–99)
Glucose, Bld: 116 mg/dL — ABNORMAL HIGH (ref 70–99)
Glucose, Bld: 150 mg/dL — ABNORMAL HIGH (ref 70–99)
Glucose, Bld: 193 mg/dL — ABNORMAL HIGH (ref 70–99)
Glucose, Bld: 242 mg/dL — ABNORMAL HIGH (ref 70–99)
Potassium: 2.7 mEq/L — CL (ref 3.5–5.1)
Potassium: 3 mEq/L — ABNORMAL LOW (ref 3.5–5.1)
Potassium: 3.3 mEq/L — ABNORMAL LOW (ref 3.5–5.1)
Potassium: 3.7 mEq/L (ref 3.5–5.1)
Potassium: 3.9 mEq/L (ref 3.5–5.1)
Potassium: 3.8 mEq/L (ref 3.5–5.1)
Potassium: 4.2 mEq/L (ref 3.5–5.1)
Potassium: 5.1 mEq/L (ref 3.5–5.1)
Sodium: 139 mEq/L (ref 135–145)
Sodium: 139 mEq/L (ref 135–145)
Sodium: 142 mEq/L (ref 135–145)
Sodium: 132 mEq/L — ABNORMAL LOW (ref 135–145)
Sodium: 137 mEq/L (ref 135–145)
Sodium: 137 mEq/L (ref 135–145)

## 2011-02-11 LAB — CBC
HCT: 36.7 % (ref 36.0–46.0)
HCT: 36.9 % (ref 36.0–46.0)
HCT: 38 % (ref 36.0–46.0)
HCT: 44.1 % (ref 36.0–46.0)
HCT: 38.4 % (ref 36.0–46.0)
HCT: 39.6 % (ref 36.0–46.0)
Hemoglobin: 12.5 g/dL (ref 12.0–15.0)
Hemoglobin: 12.7 g/dL (ref 12.0–15.0)
Hemoglobin: 11.9 g/dL — ABNORMAL LOW (ref 12.0–15.0)
Hemoglobin: 12 g/dL (ref 12.0–15.0)
Hemoglobin: 13.1 g/dL (ref 12.0–15.0)
MCHC: 34.1 g/dL (ref 30.0–36.0)
MCHC: 34.4 g/dL (ref 30.0–36.0)
MCHC: 34.8 g/dL (ref 30.0–36.0)
MCHC: 35.3 g/dL (ref 30.0–36.0)
MCHC: 33.2 g/dL (ref 30.0–36.0)
MCHC: 33.8 g/dL (ref 30.0–36.0)
MCHC: 33.8 g/dL (ref 30.0–36.0)
MCV: 97.1 fL (ref 78.0–100.0)
MCV: 97.7 fL (ref 78.0–100.0)
MCV: 97.8 fL (ref 78.0–100.0)
MCV: 98.2 fL (ref 78.0–100.0)
MCV: 98.9 fL (ref 78.0–100.0)
MCV: 99.2 fL (ref 78.0–100.0)
MCV: 100.7 fL — ABNORMAL HIGH (ref 78.0–100.0)
MCV: 100.7 fL — ABNORMAL HIGH (ref 78.0–100.0)
MCV: 101.7 fL — ABNORMAL HIGH (ref 78.0–100.0)
MCV: 98.8 fL (ref 78.0–100.0)
MCV: 99 fL (ref 78.0–100.0)
MCV: 99.9 fL (ref 78.0–100.0)
Platelets: 135 10*3/uL — ABNORMAL LOW (ref 150–400)
Platelets: 135 10*3/uL — ABNORMAL LOW (ref 150–400)
Platelets: 169 10*3/uL (ref 150–400)
Platelets: 176 10*3/uL (ref 150–400)
Platelets: 196 10*3/uL (ref 150–400)
Platelets: 201 10*3/uL (ref 150–400)
Platelets: 204 10*3/uL (ref 150–400)
Platelets: 105 10*3/uL — ABNORMAL LOW (ref 150–400)
Platelets: 109 10*3/uL — ABNORMAL LOW (ref 150–400)
Platelets: 116 10*3/uL — ABNORMAL LOW (ref 150–400)
RBC: 3.38 MIL/uL — ABNORMAL LOW (ref 3.87–5.11)
RBC: 3.78 MIL/uL — ABNORMAL LOW (ref 3.87–5.11)
RBC: 3.85 MIL/uL — ABNORMAL LOW (ref 3.87–5.11)
RBC: 3.96 MIL/uL (ref 3.87–5.11)
RBC: 3.99 MIL/uL (ref 3.87–5.11)
RBC: 4.53 MIL/uL (ref 3.87–5.11)
RBC: 3.57 MIL/uL — ABNORMAL LOW (ref 3.87–5.11)
RBC: 3.6 MIL/uL — ABNORMAL LOW (ref 3.87–5.11)
RDW: 12.2 % (ref 11.5–15.5)
RDW: 12.3 % (ref 11.5–15.5)
RDW: 12.5 % (ref 11.5–15.5)
RDW: 12.5 % (ref 11.5–15.5)
RDW: 12.6 % (ref 11.5–15.5)
RDW: 13.3 % (ref 11.5–15.5)
RDW: 14.8 % (ref 11.5–15.5)
RDW: 15 % (ref 11.5–15.5)
RDW: 15.2 % (ref 11.5–15.5)
WBC: 10.9 10*3/uL — ABNORMAL HIGH (ref 4.0–10.5)
WBC: 11.2 10*3/uL — ABNORMAL HIGH (ref 4.0–10.5)
WBC: 12.4 10*3/uL — ABNORMAL HIGH (ref 4.0–10.5)
WBC: 17.2 10*3/uL — ABNORMAL HIGH (ref 4.0–10.5)
WBC: 10.5 10*3/uL (ref 4.0–10.5)
WBC: 11.1 10*3/uL — ABNORMAL HIGH (ref 4.0–10.5)
WBC: 12.1 10*3/uL — ABNORMAL HIGH (ref 4.0–10.5)
WBC: 14.1 10*3/uL — ABNORMAL HIGH (ref 4.0–10.5)

## 2011-02-11 LAB — GLUCOSE, CAPILLARY
Glucose-Capillary: 103 mg/dL — ABNORMAL HIGH (ref 70–99)
Glucose-Capillary: 105 mg/dL — ABNORMAL HIGH (ref 70–99)
Glucose-Capillary: 107 mg/dL — ABNORMAL HIGH (ref 70–99)
Glucose-Capillary: 108 mg/dL — ABNORMAL HIGH (ref 70–99)
Glucose-Capillary: 108 mg/dL — ABNORMAL HIGH (ref 70–99)
Glucose-Capillary: 116 mg/dL — ABNORMAL HIGH (ref 70–99)
Glucose-Capillary: 121 mg/dL — ABNORMAL HIGH (ref 70–99)
Glucose-Capillary: 122 mg/dL — ABNORMAL HIGH (ref 70–99)
Glucose-Capillary: 124 mg/dL — ABNORMAL HIGH (ref 70–99)
Glucose-Capillary: 142 mg/dL — ABNORMAL HIGH (ref 70–99)
Glucose-Capillary: 148 mg/dL — ABNORMAL HIGH (ref 70–99)
Glucose-Capillary: 152 mg/dL — ABNORMAL HIGH (ref 70–99)
Glucose-Capillary: 152 mg/dL — ABNORMAL HIGH (ref 70–99)
Glucose-Capillary: 154 mg/dL — ABNORMAL HIGH (ref 70–99)
Glucose-Capillary: 157 mg/dL — ABNORMAL HIGH (ref 70–99)
Glucose-Capillary: 163 mg/dL — ABNORMAL HIGH (ref 70–99)
Glucose-Capillary: 169 mg/dL — ABNORMAL HIGH (ref 70–99)
Glucose-Capillary: 169 mg/dL — ABNORMAL HIGH (ref 70–99)
Glucose-Capillary: 169 mg/dL — ABNORMAL HIGH (ref 70–99)
Glucose-Capillary: 183 mg/dL — ABNORMAL HIGH (ref 70–99)
Glucose-Capillary: 188 mg/dL — ABNORMAL HIGH (ref 70–99)
Glucose-Capillary: 193 mg/dL — ABNORMAL HIGH (ref 70–99)
Glucose-Capillary: 193 mg/dL — ABNORMAL HIGH (ref 70–99)
Glucose-Capillary: 202 mg/dL — ABNORMAL HIGH (ref 70–99)
Glucose-Capillary: 260 mg/dL — ABNORMAL HIGH (ref 70–99)
Glucose-Capillary: 80 mg/dL (ref 70–99)
Glucose-Capillary: 85 mg/dL (ref 70–99)
Glucose-Capillary: 85 mg/dL (ref 70–99)
Glucose-Capillary: 85 mg/dL (ref 70–99)
Glucose-Capillary: 91 mg/dL (ref 70–99)
Glucose-Capillary: 94 mg/dL (ref 70–99)
Glucose-Capillary: 99 mg/dL (ref 70–99)
Glucose-Capillary: 167 mg/dL — ABNORMAL HIGH (ref 70–99)
Glucose-Capillary: 175 mg/dL — ABNORMAL HIGH (ref 70–99)
Glucose-Capillary: 201 mg/dL — ABNORMAL HIGH (ref 70–99)
Glucose-Capillary: 204 mg/dL — ABNORMAL HIGH (ref 70–99)
Glucose-Capillary: 241 mg/dL — ABNORMAL HIGH (ref 70–99)
Glucose-Capillary: 324 mg/dL — ABNORMAL HIGH (ref 70–99)

## 2011-02-11 LAB — FUNGUS CULTURE W SMEAR: Fungal Smear: NONE SEEN

## 2011-02-11 LAB — CSF CELL COUNT WITH DIFFERENTIAL
Eosinophils, CSF: 3 % — ABNORMAL HIGH (ref 0–1)
Monocyte-Macrophage-Spinal Fluid: 13 % — ABNORMAL LOW (ref 15–45)
Segmented Neutrophils-CSF: 1 % (ref 0–6)
WBC, CSF: 99 /mm3 (ref 0–5)
WBC, CSF: 1 /mm3 (ref 0–5)

## 2011-02-11 LAB — MISCELLANEOUS TEST
Miscellaneous Test Results: <1:1 {titer}
Miscellaneous Test: 310
Miscellaneous Test: 314
Miscellaneous Test: 315

## 2011-02-11 LAB — VARICELLA-ZOSTER BY PCR: Varicella-Zoster, PCR: NOT DETECTED

## 2011-02-11 LAB — DIFFERENTIAL
Basophils Absolute: 0 10*3/uL (ref 0.0–0.1)
Basophils Absolute: 0 10*3/uL (ref 0.0–0.1)
Basophils Absolute: 0.1 10*3/uL (ref 0.0–0.1)
Basophils Relative: 0 % (ref 0–1)
Basophils Relative: 0 % (ref 0–1)
Basophils Relative: 0 % (ref 0–1)
Basophils Relative: 0 % (ref 0–1)
Eosinophils Absolute: 0.2 10*3/uL (ref 0.0–0.7)
Eosinophils Absolute: 0.2 10*3/uL (ref 0.0–0.7)
Eosinophils Absolute: 0 10*3/uL (ref 0.0–0.7)
Eosinophils Absolute: 0 10*3/uL (ref 0.0–0.7)
Eosinophils Absolute: 0 10*3/uL (ref 0.0–0.7)
Eosinophils Relative: 2 % (ref 0–5)
Eosinophils Relative: 0 % (ref 0–5)
Eosinophils Relative: 0 % (ref 0–5)
Lymphocytes Relative: 15 % (ref 12–46)
Lymphocytes Relative: 6 % — ABNORMAL LOW (ref 12–46)
Lymphocytes Relative: 6 % — ABNORMAL LOW (ref 12–46)
Lymphocytes Relative: 7 % — ABNORMAL LOW (ref 12–46)
Lymphocytes Relative: 5 % — ABNORMAL LOW (ref 12–46)
Lymphs Abs: 0.7 10*3/uL (ref 0.7–4.0)
Lymphs Abs: 0.9 10*3/uL (ref 0.7–4.0)
Lymphs Abs: 0.9 10*3/uL (ref 0.7–4.0)
Lymphs Abs: 1.9 10*3/uL (ref 0.7–4.0)
Lymphs Abs: 0.8 10*3/uL (ref 0.7–4.0)
Monocytes Relative: 10 % (ref 3–12)
Monocytes Relative: 6 % (ref 3–12)
Monocytes Relative: 7 % (ref 3–12)
Neutro Abs: 17.5 10*3/uL — ABNORMAL HIGH (ref 1.7–7.7)
Neutro Abs: 9.5 10*3/uL — ABNORMAL HIGH (ref 1.7–7.7)
Neutro Abs: 9.9 10*3/uL — ABNORMAL HIGH (ref 1.7–7.7)
Neutrophils Relative %: 80 % — ABNORMAL HIGH (ref 43–77)
Neutrophils Relative %: 87 % — ABNORMAL HIGH (ref 43–77)
Neutrophils Relative %: 88 % — ABNORMAL HIGH (ref 43–77)
Neutrophils Relative %: 92 % — ABNORMAL HIGH (ref 43–77)
Neutrophils Relative %: 88 % — ABNORMAL HIGH (ref 43–77)
Neutrophils Relative %: 89 % — ABNORMAL HIGH (ref 43–77)
Neutrophils Relative %: 91 % — ABNORMAL HIGH (ref 43–77)

## 2011-02-11 LAB — SJOGRENS SYNDROME-B EXTRACTABLE NUCLEAR ANTIBODY: SSB (La) (ENA) Antibody, IgG: <0.2 AI (ref <1.0)

## 2011-02-11 LAB — RHEUMATOID FACTOR: Rheumatoid fact SerPl-aCnc: <20 [IU]/mL (ref 0–20)

## 2011-02-11 LAB — BASIC METABOLIC PANEL WITH GFR
Calcium: 8.7 mg/dL (ref 8.4–10.5)
Chloride: 105 meq/L (ref 96–112)
Creatinine, Ser: 1.08 mg/dL (ref 0.4–1.2)
GFR calc Af Amer: >60 mL/min (ref >60)
GFR calc non Af Amer: 50 mL/min — ABNORMAL LOW (ref >60)

## 2011-02-11 LAB — AFB CULTURE WITH SMEAR (NOT AT ARMC): Acid Fast Smear: NONE SEEN

## 2011-02-11 LAB — VANCOMYCIN, TROUGH
Vancomycin Tr: 22.3 ug/mL — ABNORMAL HIGH (ref 10.0–20.0)
Vancomycin Tr: 14.4 ug/mL (ref 10.0–20.0)

## 2011-02-11 LAB — ANTI-DNA ANTIBODY, DOUBLE-STRANDED: ds DNA Ab: <1 IU/mL (ref <5)

## 2011-02-11 LAB — URINE CULTURE
Colony Count: NO GROWTH
Culture: NO GROWTH

## 2011-02-11 LAB — CULTURE, BLOOD (ROUTINE X 2)
Culture: NO GROWTH
Culture: NO GROWTH

## 2011-02-11 LAB — COMPREHENSIVE METABOLIC PANEL
ALT: 12 U/L (ref 0–35)
ALT: 16 U/L (ref 0–35)
ALT: 16 U/L (ref 0–35)
ALT: 17 U/L (ref 0–35)
ALT: 24 U/L (ref 0–35)
ALT: 52 U/L — ABNORMAL HIGH (ref 0–35)
AST: 12 U/L (ref 0–37)
AST: 9 U/L (ref 0–37)
AST: 11 U/L (ref 0–37)
AST: 27 U/L (ref 0–37)
Albumin: 3.1 g/dL — ABNORMAL LOW (ref 3.5–5.2)
Albumin: 3.3 g/dL — ABNORMAL LOW (ref 3.5–5.2)
Albumin: 3.9 g/dL (ref 3.5–5.2)
BUN: 20 mg/dL (ref 6–23)
BUN: 26 mg/dL — ABNORMAL HIGH (ref 6–23)
BUN: 7 mg/dL (ref 6–23)
CO2: 21 mEq/L (ref 19–32)
CO2: 23 mEq/L (ref 19–32)
CO2: 28 mEq/L (ref 19–32)
CO2: 26 mEq/L (ref 19–32)
CO2: 27 mEq/L (ref 19–32)
Calcium: 8.3 mg/dL — ABNORMAL LOW (ref 8.4–10.5)
Calcium: 8.5 mg/dL (ref 8.4–10.5)
Calcium: 9.1 mg/dL (ref 8.4–10.5)
Calcium: 7.6 mg/dL — ABNORMAL LOW (ref 8.4–10.5)
Chloride: 105 mEq/L (ref 96–112)
Chloride: 106 mEq/L (ref 96–112)
Chloride: 97 mEq/L (ref 96–112)
Chloride: 103 mEq/L (ref 96–112)
Chloride: 103 mEq/L (ref 96–112)
Creatinine, Ser: 0.9 mg/dL (ref 0.4–1.2)
Creatinine, Ser: 1.13 mg/dL (ref 0.4–1.2)
Creatinine, Ser: 1.19 mg/dL (ref 0.4–1.2)
Creatinine, Ser: 0.7 mg/dL (ref 0.4–1.2)
GFR calc Af Amer: 54 mL/min — ABNORMAL LOW (ref >60)
GFR calc Af Amer: >60 mL/min (ref >60)
GFR calc Af Amer: >60 mL/min (ref >60)
GFR calc Af Amer: >60 mL/min (ref >60)
GFR calc non Af Amer: 33 mL/min — ABNORMAL LOW (ref >60)
GFR calc non Af Amer: 47 mL/min — ABNORMAL LOW (ref >60)
GFR calc non Af Amer: >60 mL/min (ref >60)
GFR calc non Af Amer: >60 mL/min (ref >60)
GFR calc non Af Amer: >60 mL/min (ref >60)
Glucose, Bld: 125 mg/dL — ABNORMAL HIGH (ref 70–99)
Glucose, Bld: 177 mg/dL — ABNORMAL HIGH (ref 70–99)
Glucose, Bld: 181 mg/dL — ABNORMAL HIGH (ref 70–99)
Potassium: 2.7 mEq/L — CL (ref 3.5–5.1)
Sodium: 129 mEq/L — ABNORMAL LOW (ref 135–145)
Sodium: 137 mEq/L (ref 135–145)
Sodium: 139 mEq/L (ref 135–145)
Sodium: 139 mEq/L (ref 135–145)
Total Bilirubin: 0.5 mg/dL (ref 0.3–1.2)
Total Bilirubin: 0.7 mg/dL (ref 0.3–1.2)
Total Bilirubin: 0.4 mg/dL (ref 0.3–1.2)
Total Bilirubin: 0.4 mg/dL (ref 0.3–1.2)
Total Protein: 5.5 g/dL — ABNORMAL LOW (ref 6.0–8.3)
Total Protein: 6.2 g/dL (ref 6.0–8.3)
Total Protein: 6.7 g/dL (ref 6.0–8.3)

## 2011-02-11 LAB — PROTIME-INR: Prothrombin Time: 14.6 seconds (ref 11.6–15.2)

## 2011-02-11 LAB — MAGNESIUM
Magnesium: 1.4 mg/dL — ABNORMAL LOW (ref 1.5–2.5)
Magnesium: 1.8 mg/dL (ref 1.5–2.5)
Magnesium: 1.9 mg/dL (ref 1.5–2.5)
Magnesium: 2.1 mg/dL (ref 1.5–2.5)
Magnesium: 1.7 mg/dL (ref 1.5–2.5)

## 2011-02-11 LAB — PREALBUMIN: Prealbumin: 15.6 mg/dL — ABNORMAL LOW (ref 18.0–45.0)

## 2011-02-11 LAB — CSF CULTURE W GRAM STAIN

## 2011-02-11 LAB — URINALYSIS, ROUTINE W REFLEX MICROSCOPIC
Bilirubin Urine: NEGATIVE
Ketones, ur: 15 mg/dL — AB
Protein, ur: NEGATIVE mg/dL
Urobilinogen, UA: 1 mg/dL (ref 0.0–1.0)
pH: 6 (ref 5.0–8.0)

## 2011-02-11 LAB — CLOSTRIDIUM DIFFICILE EIA
C difficile Toxins A+B, EIA: NEGATIVE
C difficile Toxins A+B, EIA: NEGATIVE

## 2011-02-11 LAB — ANTI-NEUTROPHIL ANTIBODY

## 2011-02-11 LAB — M. TUBERCULOSIS COMPLEX BY PCR: M. tuberculosis, Direct: NOT DETECTED

## 2011-02-11 LAB — CYTOMEGALOVIRUS PCR, QUALITATIVE: Cytomegalovirus DNA: NOT DETECTED

## 2011-02-11 LAB — HEPARIN INDUCED THROMBOCYTOPENIA PNL: Heparin Induced Plt Ab: NEGATIVE

## 2011-02-11 LAB — PHOSPHORUS: Phosphorus: 2.1 mg/dL — ABNORMAL LOW (ref 2.3–4.6)

## 2011-02-11 LAB — URINALYSIS, MICROSCOPIC ONLY
Ketones, ur: NEGATIVE mg/dL
Leukocytes, UA: NEGATIVE
Nitrite: NEGATIVE
Protein, ur: NEGATIVE mg/dL
Urobilinogen, UA: 0.2 mg/dL (ref 0.0–1.0)

## 2011-02-11 LAB — PROTEIN AND GLUCOSE, CSF
Glucose, CSF: 50 mg/dL (ref 43–76)
Glucose, CSF: 106 mg/dL — ABNORMAL HIGH (ref 43–76)
Total  Protein, CSF: 440 mg/dL — ABNORMAL HIGH (ref 15–45)

## 2011-02-11 LAB — MPO/PR-3 (ANCA) ANTIBODIES

## 2011-02-11 LAB — CHOLESTEROL, TOTAL: Cholesterol: 127 mg/dL (ref 0–200)

## 2011-02-11 LAB — URIC ACID: Uric Acid, Serum: 3.3 mg/dL (ref 2.4–7.0)

## 2011-02-11 LAB — VALPROIC ACID LEVEL: Valproic Acid Lvl: 52.7 ug/mL (ref 50.0–100.0)

## 2011-02-11 LAB — CK: Total CK: 54 U/L (ref 7–177)

## 2011-02-11 LAB — ANTI-SMITH ANTIBODY: ENA SM Ab Ser-aCnc: <0.2 AI (ref <1.0)

## 2011-02-11 LAB — GRAM STAIN

## 2011-02-11 LAB — HIV ANTIBODY (ROUTINE TESTING W REFLEX): HIV: NONREACTIVE

## 2011-02-11 LAB — ANA
Anti Nuclear Antibody(ANA): NEGATIVE
Anti Nuclear Antibody(ANA): NEGATIVE

## 2011-02-11 LAB — ANGIOTENSIN CONVERTING ENZYME: Angiotensin-Converting Enzyme: 20 U/L (ref 9–67)

## 2011-02-11 LAB — HIV-1 RNA QUANT-NO REFLEX-BLD
HIV 1 RNA Quant: <48 copies/mL (ref <48)
HIV-1 RNA Quant, Log: <1.68 {Log} (ref <1.68)

## 2011-02-11 LAB — SEDIMENTATION RATE: Sed Rate: 6 mm/h (ref 0–22)

## 2011-02-11 LAB — ANTI-RIBONUCLEIC ACID ANTIBODY: Ribonucleic Protein(ENA) Antibody, IgG: 0.3 AI (ref <1.0)

## 2011-02-11 LAB — SJOGRENS SYNDROME-A EXTRACTABLE NUCLEAR ANTIBODY: SSA (Ro) (ENA) Antibody, IgG: <0.2 AI (ref <1.0)

## 2011-02-11 LAB — QUANTIFERON TB GOLD ASSAY (BLOOD): Interferon Gamma Release Assay: UNDETERMINED — AB

## 2011-02-11 LAB — TRIGLYCERIDES: Triglycerides: 83 mg/dL (ref <150)

## 2011-02-14 LAB — BASIC METABOLIC PANEL
BUN: 16 mg/dL (ref 6–23)
BUN: 17 mg/dL (ref 6–23)
BUN: 17 mg/dL (ref 6–23)
BUN: 19 mg/dL (ref 6–23)
BUN: 20 mg/dL (ref 6–23)
BUN: 20 mg/dL (ref 6–23)
BUN: 23 mg/dL (ref 6–23)
BUN: 27 mg/dL — ABNORMAL HIGH (ref 6–23)
CO2: 22 mEq/L (ref 19–32)
CO2: 22 mEq/L (ref 19–32)
CO2: 23 mEq/L (ref 19–32)
CO2: 23 mEq/L (ref 19–32)
CO2: 23 mEq/L (ref 19–32)
CO2: 23 mEq/L (ref 19–32)
Calcium: 7.1 mg/dL — ABNORMAL LOW (ref 8.4–10.5)
Calcium: 7.3 mg/dL — ABNORMAL LOW (ref 8.4–10.5)
Calcium: 7.4 mg/dL — ABNORMAL LOW (ref 8.4–10.5)
Calcium: 7.5 mg/dL — ABNORMAL LOW (ref 8.4–10.5)
Calcium: 7.7 mg/dL — ABNORMAL LOW (ref 8.4–10.5)
Calcium: 7.7 mg/dL — ABNORMAL LOW (ref 8.4–10.5)
Calcium: 8 mg/dL — ABNORMAL LOW (ref 8.4–10.5)
Chloride: 108 mEq/L (ref 96–112)
Chloride: 109 mEq/L (ref 96–112)
Chloride: 111 mEq/L (ref 96–112)
Chloride: 111 mEq/L (ref 96–112)
Chloride: 111 mEq/L (ref 96–112)
Chloride: 112 mEq/L (ref 96–112)
Chloride: 113 mEq/L — ABNORMAL HIGH (ref 96–112)
Chloride: 114 mEq/L — ABNORMAL HIGH (ref 96–112)
Chloride: 114 mEq/L — ABNORMAL HIGH (ref 96–112)
Chloride: 114 mEq/L — ABNORMAL HIGH (ref 96–112)
Creatinine, Ser: 0.61 mg/dL (ref 0.4–1.2)
Creatinine, Ser: 0.61 mg/dL (ref 0.4–1.2)
Creatinine, Ser: 0.65 mg/dL (ref 0.4–1.2)
Creatinine, Ser: 0.65 mg/dL (ref 0.4–1.2)
Creatinine, Ser: 0.66 mg/dL (ref 0.4–1.2)
Creatinine, Ser: 0.67 mg/dL (ref 0.4–1.2)
Creatinine, Ser: 0.67 mg/dL (ref 0.4–1.2)
Creatinine, Ser: 0.68 mg/dL (ref 0.4–1.2)
Creatinine, Ser: 0.68 mg/dL (ref 0.4–1.2)
Creatinine, Ser: 0.7 mg/dL (ref 0.4–1.2)
Creatinine, Ser: 0.7 mg/dL (ref 0.4–1.2)
Creatinine, Ser: 0.73 mg/dL (ref 0.4–1.2)
Creatinine, Ser: 0.74 mg/dL (ref 0.4–1.2)
Creatinine, Ser: 0.76 mg/dL (ref 0.4–1.2)
GFR calc Af Amer: >60 mL/min (ref >60)
GFR calc Af Amer: >60 mL/min (ref >60)
GFR calc Af Amer: >60 mL/min (ref >60)
GFR calc Af Amer: >60 mL/min (ref >60)
GFR calc Af Amer: >60 mL/min (ref >60)
GFR calc Af Amer: >60 mL/min (ref >60)
GFR calc Af Amer: >60 mL/min (ref >60)
GFR calc Af Amer: >60 mL/min (ref >60)
GFR calc non Af Amer: >60 mL/min (ref >60)
GFR calc non Af Amer: >60 mL/min (ref >60)
GFR calc non Af Amer: >60 mL/min (ref >60)
GFR calc non Af Amer: >60 mL/min (ref >60)
GFR calc non Af Amer: >60 mL/min (ref >60)
GFR calc non Af Amer: >60 mL/min (ref >60)
Glucose, Bld: 127 mg/dL — ABNORMAL HIGH (ref 70–99)
Glucose, Bld: 187 mg/dL — ABNORMAL HIGH (ref 70–99)
Glucose, Bld: 217 mg/dL — ABNORMAL HIGH (ref 70–99)
Glucose, Bld: 233 mg/dL — ABNORMAL HIGH (ref 70–99)
Glucose, Bld: 88 mg/dL (ref 70–99)
Potassium: 3.2 mEq/L — ABNORMAL LOW (ref 3.5–5.1)
Potassium: 3.3 mEq/L — ABNORMAL LOW (ref 3.5–5.1)
Potassium: 3.3 mEq/L — ABNORMAL LOW (ref 3.5–5.1)
Potassium: 3.4 mEq/L — ABNORMAL LOW (ref 3.5–5.1)
Potassium: 3.9 mEq/L (ref 3.5–5.1)
Potassium: 4.3 mEq/L (ref 3.5–5.1)
Sodium: 136 mEq/L (ref 135–145)
Sodium: 138 mEq/L (ref 135–145)
Sodium: 139 mEq/L (ref 135–145)
Sodium: 139 mEq/L (ref 135–145)
Sodium: 141 mEq/L (ref 135–145)
Sodium: 142 mEq/L (ref 135–145)

## 2011-02-14 LAB — CBC
HCT: 27.8 % — ABNORMAL LOW (ref 36.0–46.0)
HCT: 30.3 % — ABNORMAL LOW (ref 36.0–46.0)
HCT: 34.7 % — ABNORMAL LOW (ref 36.0–46.0)
HCT: 37.7 % (ref 36.0–46.0)
HCT: 39.8 % (ref 36.0–46.0)
Hemoglobin: 10.3 g/dL — ABNORMAL LOW (ref 12.0–15.0)
Hemoglobin: 12 g/dL (ref 12.0–15.0)
Hemoglobin: 12.2 g/dL (ref 12.0–15.0)
Hemoglobin: 9.5 g/dL — ABNORMAL LOW (ref 12.0–15.0)
MCHC: 34 g/dL (ref 30.0–36.0)
MCHC: 34.1 g/dL (ref 30.0–36.0)
MCHC: 34.1 g/dL (ref 30.0–36.0)
MCHC: 34.4 g/dL (ref 30.0–36.0)
MCHC: 34.7 g/dL (ref 30.0–36.0)
MCV: 100 fL (ref 78.0–100.0)
MCV: 100.2 fL — ABNORMAL HIGH (ref 78.0–100.0)
MCV: 100.2 fL — ABNORMAL HIGH (ref 78.0–100.0)
MCV: 100.2 fL — ABNORMAL HIGH (ref 78.0–100.0)
MCV: 100.5 fL — ABNORMAL HIGH (ref 78.0–100.0)
MCV: 100.7 fL — ABNORMAL HIGH (ref 78.0–100.0)
MCV: 101.4 fL — ABNORMAL HIGH (ref 78.0–100.0)
MCV: 99.3 fL (ref 78.0–100.0)
MCV: 99.4 fL (ref 78.0–100.0)
MCV: 99.7 fL (ref 78.0–100.0)
MCV: 99.8 fL (ref 78.0–100.0)
Platelets: 112 10*3/uL — ABNORMAL LOW (ref 150–400)
Platelets: 126 10*3/uL — ABNORMAL LOW (ref 150–400)
Platelets: 46 10*3/uL — ABNORMAL LOW (ref 150–400)
Platelets: 67 10*3/uL — ABNORMAL LOW (ref 150–400)
Platelets: 92 10*3/uL — ABNORMAL LOW (ref 150–400)
RBC: 2.77 MIL/uL — ABNORMAL LOW (ref 3.87–5.11)
RBC: 2.98 MIL/uL — ABNORMAL LOW (ref 3.87–5.11)
RBC: 2.99 MIL/uL — ABNORMAL LOW (ref 3.87–5.11)
RBC: 3 MIL/uL — ABNORMAL LOW (ref 3.87–5.11)
RBC: 3.51 MIL/uL — ABNORMAL LOW (ref 3.87–5.11)
RBC: 3.53 MIL/uL — ABNORMAL LOW (ref 3.87–5.11)
RBC: 3.61 MIL/uL — ABNORMAL LOW (ref 3.87–5.11)
RBC: 3.67 MIL/uL — ABNORMAL LOW (ref 3.87–5.11)
RBC: 3.74 MIL/uL — ABNORMAL LOW (ref 3.87–5.11)
RBC: 3.9 MIL/uL (ref 3.87–5.11)
RBC: 4 MIL/uL (ref 3.87–5.11)
RDW: 15.3 % (ref 11.5–15.5)
RDW: 15.6 % — ABNORMAL HIGH (ref 11.5–15.5)
RDW: 16.4 % — ABNORMAL HIGH (ref 11.5–15.5)
RDW: 16.9 % — ABNORMAL HIGH (ref 11.5–15.5)
RDW: 17.5 % — ABNORMAL HIGH (ref 11.5–15.5)
WBC: 4.5 10*3/uL (ref 4.0–10.5)
WBC: 5.1 10*3/uL (ref 4.0–10.5)
WBC: 5.2 10*3/uL (ref 4.0–10.5)
WBC: 5.5 10*3/uL (ref 4.0–10.5)
WBC: 6.2 10*3/uL (ref 4.0–10.5)
WBC: 6.7 10*3/uL (ref 4.0–10.5)
WBC: 6.7 10*3/uL (ref 4.0–10.5)
WBC: 6.8 10*3/uL (ref 4.0–10.5)
WBC: 8.1 10*3/uL (ref 4.0–10.5)
WBC: 9.5 10*3/uL (ref 4.0–10.5)
WBC: 9.7 10*3/uL (ref 4.0–10.5)

## 2011-02-14 LAB — QUANTIFERON TB GOLD ASSAY (BLOOD)
Mitogen Minus Nil Value: 0.22 IU/mL
Quantiferon Nil Value: 0.11 IU/mL

## 2011-02-14 LAB — GLUCOSE, CAPILLARY
Glucose-Capillary: 107 mg/dL — ABNORMAL HIGH (ref 70–99)
Glucose-Capillary: 110 mg/dL — ABNORMAL HIGH (ref 70–99)
Glucose-Capillary: 110 mg/dL — ABNORMAL HIGH (ref 70–99)
Glucose-Capillary: 112 mg/dL — ABNORMAL HIGH (ref 70–99)
Glucose-Capillary: 112 mg/dL — ABNORMAL HIGH (ref 70–99)
Glucose-Capillary: 113 mg/dL — ABNORMAL HIGH (ref 70–99)
Glucose-Capillary: 118 mg/dL — ABNORMAL HIGH (ref 70–99)
Glucose-Capillary: 120 mg/dL — ABNORMAL HIGH (ref 70–99)
Glucose-Capillary: 120 mg/dL — ABNORMAL HIGH (ref 70–99)
Glucose-Capillary: 122 mg/dL — ABNORMAL HIGH (ref 70–99)
Glucose-Capillary: 123 mg/dL — ABNORMAL HIGH (ref 70–99)
Glucose-Capillary: 140 mg/dL — ABNORMAL HIGH (ref 70–99)
Glucose-Capillary: 141 mg/dL — ABNORMAL HIGH (ref 70–99)
Glucose-Capillary: 143 mg/dL — ABNORMAL HIGH (ref 70–99)
Glucose-Capillary: 150 mg/dL — ABNORMAL HIGH (ref 70–99)
Glucose-Capillary: 151 mg/dL — ABNORMAL HIGH (ref 70–99)
Glucose-Capillary: 152 mg/dL — ABNORMAL HIGH (ref 70–99)
Glucose-Capillary: 162 mg/dL — ABNORMAL HIGH (ref 70–99)
Glucose-Capillary: 163 mg/dL — ABNORMAL HIGH (ref 70–99)
Glucose-Capillary: 177 mg/dL — ABNORMAL HIGH (ref 70–99)
Glucose-Capillary: 183 mg/dL — ABNORMAL HIGH (ref 70–99)
Glucose-Capillary: 197 mg/dL — ABNORMAL HIGH (ref 70–99)
Glucose-Capillary: 206 mg/dL — ABNORMAL HIGH (ref 70–99)
Glucose-Capillary: 207 mg/dL — ABNORMAL HIGH (ref 70–99)
Glucose-Capillary: 220 mg/dL — ABNORMAL HIGH (ref 70–99)
Glucose-Capillary: 227 mg/dL — ABNORMAL HIGH (ref 70–99)
Glucose-Capillary: 269 mg/dL — ABNORMAL HIGH (ref 70–99)
Glucose-Capillary: 275 mg/dL — ABNORMAL HIGH (ref 70–99)
Glucose-Capillary: 77 mg/dL (ref 70–99)
Glucose-Capillary: 79 mg/dL (ref 70–99)
Glucose-Capillary: 87 mg/dL (ref 70–99)
Glucose-Capillary: 92 mg/dL (ref 70–99)
Glucose-Capillary: 92 mg/dL (ref 70–99)
Glucose-Capillary: 93 mg/dL (ref 70–99)
Glucose-Capillary: 96 mg/dL (ref 70–99)
Glucose-Capillary: 98 mg/dL (ref 70–99)

## 2011-02-14 LAB — DIFFERENTIAL
Basophils Absolute: 0 10*3/uL (ref 0.0–0.1)
Basophils Relative: 0 % (ref 0–1)
Basophils Relative: 0 % (ref 0–1)
Eosinophils Absolute: 0.1 10*3/uL (ref 0.0–0.7)
Eosinophils Relative: 0 % (ref 0–5)
Eosinophils Relative: 1 % (ref 0–5)
Lymphocytes Relative: 7 % — ABNORMAL LOW (ref 12–46)
Lymphs Abs: 0.8 10*3/uL (ref 0.7–4.0)
Monocytes Relative: 4 % (ref 3–12)
Neutro Abs: 8.5 10*3/uL — ABNORMAL HIGH (ref 1.7–7.7)
Neutrophils Relative %: 85 % — ABNORMAL HIGH (ref 43–77)

## 2011-02-14 LAB — MAGNESIUM
Magnesium: 1.8 mg/dL (ref 1.5–2.5)
Magnesium: 2.1 mg/dL (ref 1.5–2.5)
Magnesium: 2.4 mg/dL (ref 1.5–2.5)

## 2011-02-14 LAB — COMPREHENSIVE METABOLIC PANEL
AST: 19 U/L (ref 0–37)
BUN: 29 mg/dL — ABNORMAL HIGH (ref 6–23)
CO2: 18 mEq/L — ABNORMAL LOW (ref 19–32)
Chloride: 107 mEq/L (ref 96–112)
Creatinine, Ser: 0.86 mg/dL (ref 0.4–1.2)
GFR calc non Af Amer: >60 mL/min (ref >60)
Total Bilirubin: 0.8 mg/dL (ref 0.3–1.2)

## 2011-02-14 LAB — TOXOPLASMA GONDII ANTIBODY, IGG: Toxoplasma IgG Ratio: <0.5 IU/mL

## 2011-02-14 LAB — URINALYSIS, ROUTINE W REFLEX MICROSCOPIC
Ketones, ur: NEGATIVE mg/dL
Leukocytes, UA: NEGATIVE
Nitrite: NEGATIVE
Protein, ur: 30 mg/dL — AB

## 2011-02-14 LAB — TSH: TSH: 3.084 u[IU]/mL (ref 0.350–4.500)

## 2011-02-14 LAB — HEPARIN INDUCED THROMBOCYTOPENIA PNL: Heparin Induced Plt Ab: NEGATIVE

## 2011-02-14 LAB — PHOSPHORUS: Phosphorus: 1.6 mg/dL — ABNORMAL LOW (ref 2.3–4.6)

## 2011-02-14 LAB — SEDIMENTATION RATE: Sed Rate: 2 mm/hr (ref 0–22)

## 2011-02-14 LAB — LACTATE DEHYDROGENASE: LDH: 639 U/L — ABNORMAL HIGH (ref 94–250)

## 2011-02-15 ENCOUNTER — Encounter: Payer: Self-pay | Admitting: Internal Medicine

## 2011-02-26 LAB — COMPREHENSIVE METABOLIC PANEL
AST: 8 U/L (ref 0–37)
Alkaline Phosphatase: 67 U/L (ref 39–117)
CO2: 23 mEq/L (ref 19–32)
Chloride: 106 mEq/L (ref 96–112)
Creatinine, Ser: 1 mg/dL (ref 0.4–1.2)
GFR calc Af Amer: >60 mL/min (ref >60)
GFR calc non Af Amer: 54 mL/min — ABNORMAL LOW (ref >60)
Potassium: 3.2 mEq/L — ABNORMAL LOW (ref 3.5–5.1)
Total Bilirubin: 0.9 mg/dL (ref 0.3–1.2)

## 2011-02-26 LAB — POCT CARDIAC MARKERS
CKMB, poc: <1 ng/mL — ABNORMAL LOW (ref 1.0–8.0)
Myoglobin, poc: 80.4 ng/mL (ref 12–200)
Troponin i, poc: <0.05 ng/mL (ref 0.00–0.09)

## 2011-02-26 LAB — CBC
HCT: 41.2 % (ref 36.0–46.0)
MCHC: 34.8 g/dL (ref 30.0–36.0)
MCV: 96.5 fL (ref 78.0–100.0)
RBC: 3.86 MIL/uL — ABNORMAL LOW (ref 3.87–5.11)
RBC: 4.26 MIL/uL (ref 3.87–5.11)
WBC: 11.7 10*3/uL — ABNORMAL HIGH (ref 4.0–10.5)
WBC: 13.5 10*3/uL — ABNORMAL HIGH (ref 4.0–10.5)

## 2011-02-26 LAB — CSF CELL COUNT WITH DIFFERENTIAL
Eosinophils, CSF: 5 % — ABNORMAL HIGH (ref 0–1)
Eosinophils, CSF: 7 % — ABNORMAL HIGH (ref 0–1)
Lymphs, CSF: 86 % — ABNORMAL HIGH (ref 40–80)
Monocyte-Macrophage-Spinal Fluid: 3 % — ABNORMAL LOW (ref 15–45)
Monocyte-Macrophage-Spinal Fluid: 7 % — ABNORMAL LOW (ref 15–45)
Tube #: 1
Tube #: 4

## 2011-02-26 LAB — URINALYSIS, ROUTINE W REFLEX MICROSCOPIC
Bilirubin Urine: NEGATIVE
Ketones, ur: 15 mg/dL — AB
Nitrite: POSITIVE — AB
Protein, ur: NEGATIVE mg/dL
Urobilinogen, UA: 1 mg/dL (ref 0.0–1.0)

## 2011-02-26 LAB — DIFFERENTIAL
Basophils Absolute: 0.1 10*3/uL (ref 0.0–0.1)
Basophils Relative: 1 % (ref 0–1)
Eosinophils Absolute: 0.4 10*3/uL (ref 0.0–0.7)
Eosinophils Relative: 3 % (ref 0–5)
Lymphocytes Relative: 11 % — ABNORMAL LOW (ref 12–46)

## 2011-02-26 LAB — BASIC METABOLIC PANEL
Calcium: 8.2 mg/dL — ABNORMAL LOW (ref 8.4–10.5)
Creatinine, Ser: 1.04 mg/dL (ref 0.4–1.2)
GFR calc Af Amer: >60 mL/min (ref >60)

## 2011-02-26 LAB — URINE CULTURE: Colony Count: >=100000

## 2011-02-26 LAB — ANA: Anti Nuclear Antibody(ANA): NEGATIVE

## 2011-02-26 LAB — CSF CULTURE W GRAM STAIN

## 2011-02-26 LAB — VARICELLA-ZOSTER BY PCR: Varicella-Zoster, PCR: NOT DETECTED

## 2011-02-26 LAB — HSV PCR
HSV 2 , PCR: NOT DETECTED
HSV, PCR: NOT DETECTED

## 2011-02-26 LAB — SEDIMENTATION RATE
Sed Rate: 16 mm/hr (ref 0–22)
Sed Rate: 8 mm/hr (ref 0–22)

## 2011-02-26 LAB — CULTURE, BLOOD (ROUTINE X 2): Culture: NO GROWTH

## 2011-02-26 LAB — GRAM STAIN

## 2011-02-26 LAB — GLUCOSE, CSF: Glucose, CSF: 66 mg/dL (ref 43–76)

## 2011-04-13 NOTE — Discharge Summary (Signed)
NAME:  Donna Woods, ETHERINGTON                     ACCOUNT NO.:  [id number]   MEDICAL RECORD NO.:  [id number]                   PATIENT TYPE:  INP   LOCATION:  5033                                 FACILITY:  [facility]   PHYSICIAN:  Claude Manges. Cleophas Dunker, M.D.            DATE OF BIRTH:  17-Feb-1934   DATE OF ADMISSION:  09/10/2002  DATE OF DISCHARGE:  09/14/2002                                 DISCHARGE SUMMARY   ADMISSION DIAGNOSES:  1. End-stage osteoarthritis, right knee.  2. Gastroesophageal reflux disease.  3. Hypertension.  4. Allergic rhinitis.  5. Fibromyalgia.  6. History of palpitations.  7. History of recent cold and sinus infection.   DISCHARGE DIAGNOSES:  1. End-stage osteoarthritis, right knee, status post right knee replacement.  2. Acute blood loss and anemia secondary to surgery.  3. Hypokalemia.  4. Gastroesophageal reflux disease.  5. Hypertension.  6. Allergic rhinitis.  7. Fibromyalgia.  8. History of palpitations.  9. History of recent cold and sinus infection.   SURGICAL PROCEDURE:  On 09/10/02, Ms. Donna Woods underwent a right total knee  arthroplasty by Dr. Claude Manges. Whitfield, assisted by Nathanial Rancher, P.A.-C. She  had a DePuy LCS knee inserted, standard 12.5-mm bearing, a cruciform rotated  cemented patella size standard, a femoral component that was size standard  right. She also had a tibial tray which was cemented in a size 3.   COMPLICATIONS:  None.   CONSULTANTS:  1. Pharmacy consult for Coumadin therapy 09/10/02.  2. Rehab medicine and physical therapy consult 09/11/02.   HISTORY OF PRESENT ILLNESS:  This 77 year old white female patient presents  to Dr. Cleophas Dunker with five to six year history of  gradual onset of  progressively worsening right knee pain. Pain is intermittent but a raw  feeling in the knee, located mostly over the medial aspect with radiation  into the posterior knee. The pain increases with standing for a prolonged  time or any  walking. It does decrease with rest. Knee pops, catches, grinds,  rocks, and swells. She has failed conservative treatment, and because of  this, she is presenting for right knee replacement.   HOSPITAL COURSE:  Donna Woods tolerated her surgical procedure well without  immediately postoperative complications, She was transferred to 5,000. She  was started on routine physical therapy and made good progress. Her  hemoglobin on postoperative day #1 was 8.8 with a hematocrit of 26.4. She  was subsequently transferred with autologous blood.   She continued to make good progress with therapy over the next several days.  Her potassium did drop to 3.1, and she required supplements, but that did  rebound to 3.4. She was switched to p.o. pain medications effectively, and  it was felt on 10/20 that she was ready for discharge home. She was  discharged home later that day.   DIET:  She can resume her regular prehospitalization diet.   MEDICATIONS:  She can resume all of her  prehospitalization medications with  the exception of her Vioxx. These included:  1. Fosamax 70 mg p.o. every Monday.  2. Prilosec 20 mg p.o. b.i.d.  3. Claritin 10 mg p.o. q.d.  4. Atenolol 12.5 mg p.o. b.i.d.  5. Triamterene/hydrochlorothiazide 37.5 mg p.o. q.d. p.r.n.  6. Multivitamin one tablet p.o. q.d.  7. Vitamin E one tablet p.o. q.d.  8. Glucosamine and chondroitin sulfate one tablet p.o. q.d.  9. Nasonex nasal spray one squirt each nares p.r.n. congestion.  10.      Albuterol inhaler two puffs inhaled q.4h. p.r.n.  11.      Ferrous sulfate 325 mg p.o. b.i.d.   Additional medications include:  1. Coumadin two tablets on the day of admission and then one and a half     tablets p.o. q.d. with the dose to be directed by [place] home health     pharmacy.  2. OxyContin 10 mg p.o. q.12h., 22, with no refill; do not crush or chew     tablets.  3. Percocet 5/325 mg one to two p.o. q.4h. p.r.n. for pain, 50, with no      refill.  4. Robaxin 500 mg one to two p.o. q.6h. p.r.n. for spasm, 30, with no     refill.   ACTIVITY:  She is to be out of bed, partial weight bearing-50% or less on  the right leg with use of a walker. She is arranged for home health physical  therapy and a R.N. per [place] home [facility] care.   WOUND CARE:  She is to keep her right knee incision clean and dry. May  shower after no drainage from the wound for two days. She is arranged for a  home CPM 0 to 90 degrees six to eight hours a day. She is to notify Dr.  Cleophas Dunker of temperature greater than or equal to 101.5 degrees Fahrenheit,  chills, pain unrelieved by pain medications, or foul-smelling drainage from  the wound.   FOLLOW UP:  She is to followup with Dr. Cleophas Dunker in our office in  approximately 10 days and is to call [phone] for that appointment.   LABORATORY DATA:  On 10/13, hemoglobin 11.8, hematocrit 35.3. On 10/17,  hemoglobin 8.8, hematocrit 26.4. On 10/18, hemoglobin 10.5, hematocrit 31.5,  platelets 147. On 10/19, white count 8.8, hemoglobin 10.4, hematocrit 31.3,  platelets 158.   On 10/13, PT 13.4, INR 1, PTT 24. On 10/24, PT was 17.4, INR 1.5.   On 10/13, glucose was 113. On 10/17, potassium was 3.4, glucose 118, calcium  7.9. On 10/18, sodium 140, potassium 3.1, glucose 135, calcium 7.7. On  10/20, sodium 138, potassium 3.4, and calcium 7.9. All other laboratory  studies were within normal limits.       Legrand Pitts Duffy, P.A.                      Claude Manges. Cleophas Dunker, M.D.    KED/MEDQ  D:  10/01/2002  T:  10/02/2002  Job:  [id number]   cc:   Karlene Einstein, M.D.  [place]

## 2011-04-13 NOTE — Op Note (Signed)
NAME:  Donna Woods, CLINKSCALE                     ACCOUNT NO.:  [id number]   MEDICAL RECORD NO.:  [id number]                   PATIENT TYPE:  INP   LOCATION:  2887                                 FACILITY:  [facility]   PHYSICIAN:  Claude Manges. Cleophas Dunker, M.D.            DATE OF BIRTH:  1933-12-09   DATE OF PROCEDURE:  09/10/2002  DATE OF DISCHARGE:                                 OPERATIVE REPORT   PREOPERATIVE DIAGNOSIS:  End-stage osteoarthritis, right knee.   POSTOPERATIVE DIAGNOSIS:  End-stage osteoarthritis, right knee.   PROCEDURE:  Right total knee replacement.   SURGEON:  Claude Manges. Cleophas Dunker, M.D.   ASSISTANT:  Legrand Pitts. Duffy, P.A.   ANESTHESIA:  General orotracheal.   COMPLICATIONS:  None.   COMPONENTS:  DePuy LCS Complete standard femoral component, a #3 rotating  tibial platform with a 12.5 mm polyethylene bridging bearing and a standard  metal-backed cruciate-designed rotating patella.  All were secured with  polymethyl methacrylate.   DESCRIPTION OF PROCEDURE:  With the patient comfortable on the operating  table and under general orotracheal anesthesia, the right lower extremity  was placed in a thigh tourniquet.  The leg was then prepped with Betadine  scrub and Duraprep from the tourniquet to the midfoot.  Sterile draping was  performed.  Prior to scrubbing, a Foley was inserted by the nursing staff.   The extremity was elevated and then Esmarch-exsanguinated with a proximal  tourniquet at 350 mmHg.   A midline longitudinal incision was made from the superior pouch to the  tibial tubercle, by sharp dissection carried down to subcutaneous tissue.  The first layer of capsule was incised in the midline.  A medial  parapatellar incision was then made with the Bovie.  There was minimal clear  yellow joint effusion.   The patella was everted 180 degrees and the knee flexed to 90 degrees.  There were moderate-sized osteophytes on the medial and lateral femoral  condyle.   There was considerable arthrosis of the lateral compartment, but  she did not have a fixed valgus position.  There was little if any articular  cartilage remaining on the lateral femoral condyle, and there were multiple  areas of exposed bone along the medial femoral condyle.   Preoperatively we had templated a standard femoral component.  This was  confirmed intraoperatively.  We also templated a #3 tibial tray, which was  also confirmed intraoperatively.   The appropriate femoral and tibial jigs were applied to obtain the  appropriate femoral and tibial cuts.  ACL and PCL were sacrificed.  MCL and  LCL remained intact.  A 7 degree posterior inclination angle was used on the  tibial cut, a 4 degree distal femoral valgus cut was utilized.  The 12.5  flexion and extension gaps were appropriately symmetrical.   Laminar spreaders were inserted to remove medial and lateral menisci as well  as remnants of the MCL as well as ACL and PCL.  There were some osteophytes  behind the femoral condyle medially, which were excised.   The final cut was made in the tibia with the keeled jig.  The trial  components were then inserted, the knee placed through a full range of  motion.  There was no opening to varus or valgus stress, and there was  slight hyperextension.  In flexion-extension, there was no malrotation of  the tibial component.   The patella was then prepared by removing approximately 10 mm of bone.  The  cruciate-design jig was applied and the cruciate cut made in the patella.  The trial patella was then applied and fit flush on the bony surface.   He was placed through a full range of motion without subluxation or  dislocation.   The trial components were removed.  The joint was copiously irrigated with  jet saline and antibiotic solution.  The final components were inserted with  polymethyl methacrylate.  Extraneous methacrylate was removed with Michaelle Copas,  and the methacrylate was  allowed to mature with the knee in extension with a  patella clamp on the patella.   The joint was then re-explored.  The hardened methacrylate was removed with  an osteotome.   The joint was again irrigated with the jet saline.   The tourniquet was deflated.  There was immediate capillary refill to the  operative site.  Gross bleeders were Bovie coagulated.  There was some minor  bleeding from the joint; accordingly, a Hemovac was inserted.  The deep  capsule was closed with interrupted #1 Tycron, the superficial capsule  closed with a running 0 Vicryl, subcu with 2-0 Vicryl, skin closed with skin  clips.  Sterile bulky dressing was applied, followed by an Ace bandage.   The patient tolerated the procedure without complications.  She was to  receive a femoral nerve block postoperatively for pain control.                                               Claude Manges. Cleophas Dunker, M.D.    PWW/MEDQ  D:  09/10/2002  T:  09/11/2002  Job:  [id number]

## 2011-04-13 NOTE — H&P (Signed)
NAME:  Donna Woods, Donna Woods                     ACCOUNT NO.:  [id number]   MEDICAL RECORD NO.:  [id number]                   PATIENT TYPE:  INP   LOCATION:  NA                                   FACILITY:  [facility]   PHYSICIAN:  Claude Manges. Cleophas Dunker, M.D.            DATE OF BIRTH:  1934/03/16   DATE OF ADMISSION:  09/10/2002  DATE OF DISCHARGE:                                HISTORY & PHYSICAL   CHIEF COMPLAINT:  Progressively worsening right knee pain for the last five  to six years.   HISTORY OF PRESENT ILLNESS:  This 77 year old white female patient presented  to  [facility]. [place], M.D., with a five to six-year history of gradual  onset of progressively worsening right knee pain.  She has had no prior  surgery or injury to the right knee.  At this point the pain is pretty much  intermittent, but she describes it as a raw feeling in the knee that seems  to be located mostly about the medial aspect of the knee with some radiation  into the posterior knee at times.  No other radiation of the pain.  The pain  does increase if she stands in one position for a long time or does any  prolonged walking.  The pain does decrease with rest.  The knee does pop,  catch, grind, lock, and swell at times.  She says that when she stands up  for a long period of time, it feels like the knee locks in one place and she  has to move it around to kind of get it going again.  The pain does  occasionally keep her up at night.  She is currently taking Vioxx 25 mg a  day and that provides a moderate relief of her pain.  She has tried  cortisone and Hyalgan in the past and that also provided moderate relief,  but that has been several years ago.  She is not ambulating with any  assistive devices.   ALLERGIES:  PENICILLIN causes a rash.   CURRENT MEDICATIONS:  1. Fosamax 70 mg one tablet p.o. every Monday.  2. Prilosec 20 mg one tablet p.o. b.i.d.  3. Vioxx 25 mg one tablet p.o. q.d.  4. Claritin D 10 mg  one tablet p.o. q.d.  5. Atenolol 12.5 mg one tablet p.o. b.i.d.  6. Triamterene/hydrochlorothiazide 37.5 mg one tablet p.o. q.d. p.r.n.     swelling.  7. Multivitamins one tablet p.o. q.d.  8. Vitamin E one tablet p.o. q.d.  9. Glucosamine and chondroitin sulfate one tablet p.o. q.d.  10.      Doxycycline 100 mg p.o. b.i.d. through September 03, 2002.  11.      Nasonex nasal spray one squirt to each nares p.r.n. congestion.  12.      Albuterol inhaler two puffs inhaled q.4h. p.r.n. shortness of     breath or wheezing.  The last time she used it  was last week during a     cold she had.  13.      Ferrous sulfate 325 mg one tablet p.o. b.i.d.   PAST MEDICAL HISTORY:  She does have hypertension.  She has a history of  palpitations and irregular heartbeat for the last six years which has been  followed by Madaline Savage, M.D.  She does have gastrointestinal reflux  disease and some problems with allergic rhinitis.  She is just getting over  treatment of a sinus infection and cold with antibiotics.  She denies any  history of diabetes mellitus, thyroid disease, hiatal hernia, peptic ulcer  disease, asthma, or any other chronic medical condition other than noted  previously.   PAST SURGICAL HISTORY:  1. Tonsillectomy in the 1940s.  2. Removal of right ovarian cyst in 1970 by Dr. Janyth Pupa in [place],     [place].  3. Open cholecystectomy in 1971 by Dr. Clovis Pu in [place], [place].  4. Hysterectomy in 1972 by Dr. Clovis Pu in [place], [place].  5. Excision of nasal polyps in 1983 in [place], [place].  6. Excision of a mass on the right eye in 1976 in [place], [place].   SOCIAL HISTORY:  She denies any history of cigarette smoking, alcohol use,  or drug use.  She is married and lives with her husband in a one-story house  with five steps into the main entrance.  She is a retired Runner, broadcasting/film/video.  She and  her husband have two daughters.  The patient's medical doctor is  Dr. Karlene Einstein  in [place], [place], and his phone number is [phone].   FAMILY HISTORY:  Her mother died at the age of 61 with Charcot-Marie-Tooth  disease, a history of a stroke, and pneumonia.  Her father died at the age  of 17 with BOOP, pneumonia, and glaucoma.  She has two brothers, one age 34  and one age 19, who are alive.  The 75 year old does have Charcot-Marie-  Tooth disease and prostate cancer.  A sister is alive at age 41 and his  healthy.  She has two daughters, ages 56 and 33, and they are both alive and  well.   REVIEW OF SYMPTOMS:  She does have occasional allergic rhinitis, sinus  congestion, and recently is getting over a cold over the last week for which  her medical doctor put her on doxycycline.  That will be finished on September 03, 2002.  She does have occasional tinnitus in the left ear.  She has a  history of palpitations which is followed by Madaline Savage, M.D.  She  does complain of some stress urinary incontinence when she coughs.  She has  fibromyalgia and complains of occasional knee and elbow pain.  She does wear  glasses.  She has a living will and her power-of-attorney is Alan Mulder.  All other systems are negative and noncontributory.   PHYSICAL EXAMINATION:  GENERAL APPEARANCE:  A well-developed, well-  nourished, mildly overweight, white female in no acute distress.  She walks  without a noticeable limp.  Mood and affect are appropriate.  She talks  easily with the examiner.  HEIGHT:  5 feet 6 inches.  WEIGHT:  187 pounds.  BODY MASS INDEX:  29.  VITAL SIGNS:  Temperature 97.6 degrees Fahrenheit, pulse 60, respirations  12, BP 184/80.  HEENT:  Normocephalic and atraumatic without frontal or maxillary sinus  tenderness to palpation.  Normal  female distribution of graying hair.  Conjunctivae pink.  Sclerae anicteric.  PERRLA.  EOMs intact.  No visible external ear deformities.  Hearing grossly intact.  Tympanic membranes  pearly  gray bilaterally with good light reflex.  Nose and nasal septum  midline.  The nasal mucosa is pink and moist without exudates or polyps  noted.  Buccal mucosa pink and moist.  Good dentition.  Pharynx without  erythema or exudates.  The tongue and uvula are midline.  Tongue without  vesicular.  The uvula rises equally with phonation.  NECK:  No visible masses or lesions noted.  Trachea midline.  No palpable  lymphadenopathy nor thyromegaly.  Carotids +2 bilaterally without bruits.  Full range of motion.  Nontender to palpation along the cervical spine.  CARDIOVASCULAR:  Heart rate and rhythm regular.  S1 and S2 present without  rubs, clicks, or murmurs noted.  RESPIRATORY:  Respirations even and unlabored.  Breath sounds clear to  auscultation bilaterally without rales or wheezes noted.  ABDOMEN:  Well-healed abdominal incision line over the right upper quadrant.  Bowel sounds are present x 4 quadrants.  Soft and nontender without  hepatosplenomegaly nor CVA tenderness.  Femoral pulses +2 bilaterally.  Nontender to palpation along the entire length of the vertebral column.  BREASTS:  Exam deferred at this time.  GENITOURINARY:  Exam deferred at this time.  PELVIC:  Exam deferred at this time.  RECTAL:  Exam deferred at this time.  MUSCULOSKELETAL:  No obvious deformities of bilateral upper extremities with  full range of motion of these extremities without pain.  Radial pulses are  +2 bilaterally.  She has full range of motion of her hips, ankles, and toes  bilaterally.  DP and PT pulses are +2.  No lower extremity pedal edema.  She  has no obvious deformity of her left knee.  She has full extension and  flexion to 135 degrees.  She does have some pain with palpation over the  lateral joint line, but no effusion.  The knee is stable to varus and valgus  stress.  Negative Homan's on the left.  The right knee is more comfortable  when it rests in a slightly flexed position.  It does appear  to have about a  15-degree valgus deformity.  No other deformity noted.  She does have a +1  effusion in the knee.  She can straighten out to full extension, but flexes  only to about 120 degrees and there is a moderate amount of patellofemoral  crepitus with range of motion of the knee.  She is acutely tender to  palpation along the lateral and medial joint line, but the lateral more than  the medial joint line.  Negative Homan's sign on the right.  Able to varus  and valgus stress.  NEUROLOGIC:  Alert and oriented x 3.  Cranial nerves II-XII are grossly  intact.  Strength 5/5 in bilateral upper and lower extremities.  Rapid  alternating movements intact.  Deep tendon reflexes 2+ in bilateral upper  and lower extremities.  Sensation intact to light touch.   RADIOLOGIC FINDINGS:  X-rays taken of the right knee in January of 2002  showed about 12 degrees of valgus position in the knee with decrease in the lateral joint space and some osteophytes.   IMPRESSION:  1. End-stage osteoarthritis, right knee.  2. Gastrointestinal reflux disease.  3. Hypertension.  4. History of palpitations.  5. Allergic rhinitis.  6. Fibromyalgia.  7. History of recent  cold/sinus infection.   PLAN:  The patient will be admitted to [facility] on  September 10, 2002, where she will undergo a right total knee arthroplasty by  Claude Manges. Cleophas Dunker, M.D.  She will undergo all of the routine preoperatively  laboratory tests and studies prior to this procedure.  She has donated three  units of autologous blood for surgery.  If we have any problems while the  patient is hospitalized, we will consult one of the hospitalists or Madaline Savage, M.D.     Legrand Pitts Duffy, P.A.                      Claude Manges. Cleophas Dunker, M.D.    KED/MEDQ  D:  09/03/2002  T:  09/06/2002  Job:  [id number]

## 2011-06-10 ENCOUNTER — Other Ambulatory Visit: Payer: Self-pay | Admitting: Internal Medicine

## 2011-06-20 ENCOUNTER — Other Ambulatory Visit: Payer: Self-pay | Admitting: Internal Medicine

## 2011-06-21 ENCOUNTER — Other Ambulatory Visit: Payer: Self-pay

## 2011-06-21 MED ORDER — OMEPRAZOLE 20 MG PO CPDR: 20.0000 mg | DELAYED_RELEASE_CAPSULE | Freq: Every day | ORAL | Status: DC

## 2011-06-21 MED ORDER — TRIAMTERENE-HCTZ 37.5-25 MG PO CAPS: 1.0000 | ORAL_CAPSULE | ORAL | Status: DC

## 2011-07-05 ENCOUNTER — Telehealth: Payer: Self-pay | Admitting: *Deleted

## 2011-07-05 MED ORDER — ALBUTEROL SULFATE HFA 108 (90 BASE) MCG/ACT IN AERS: 2.0000 | INHALATION_SPRAY | Freq: Four times a day (QID) | RESPIRATORY_TRACT | Status: DC | PRN

## 2011-07-05 NOTE — Telephone Encounter (Signed)
Pt wants refill on Proair INH.  We have not filled this for her in the past, but she states she uses it PRN during allergy season.

## 2011-07-05 NOTE — Telephone Encounter (Signed)
Refill for one year 

## 2011-08-07 ENCOUNTER — Other Ambulatory Visit: Payer: Self-pay | Admitting: Internal Medicine

## 2011-08-09 ENCOUNTER — Other Ambulatory Visit: Payer: Self-pay | Admitting: Internal Medicine

## 2011-08-09 ENCOUNTER — Ambulatory Visit (INDEPENDENT_AMBULATORY_CARE_PROVIDER_SITE_OTHER): Payer: Medicare Other | Admitting: Internal Medicine

## 2011-08-09 ENCOUNTER — Ambulatory Visit
Admission: RE | Admit: 2011-08-09 | Discharge: 2011-08-09 | Disposition: A | Discharge status: Home / Self Care | Payer: Medicare Other | Source: Ambulatory Visit | Attending: Internal Medicine | Admitting: Internal Medicine

## 2011-08-09 ENCOUNTER — Encounter: Payer: Self-pay | Admitting: Internal Medicine

## 2011-08-09 VITALS — BP 129/70 | HR 60 | Temp 98.1°F | Ht 64.5 in | Wt 152.0 lb

## 2011-08-09 DIAGNOSIS — R413 Other amnesia: Secondary | ICD-10-CM

## 2011-08-09 DIAGNOSIS — K219 Gastro-esophageal reflux disease without esophagitis: Secondary | ICD-10-CM

## 2011-08-09 DIAGNOSIS — N390 Urinary tract infection, site not specified: Secondary | ICD-10-CM

## 2011-08-09 DIAGNOSIS — R7303 Prediabetes: Secondary | ICD-10-CM

## 2011-08-09 DIAGNOSIS — E049 Nontoxic goiter, unspecified: Secondary | ICD-10-CM

## 2011-08-09 DIAGNOSIS — E611 Iron deficiency: Secondary | ICD-10-CM

## 2011-08-09 DIAGNOSIS — E876 Hypokalemia: Secondary | ICD-10-CM

## 2011-08-09 DIAGNOSIS — J309 Allergic rhinitis, unspecified: Secondary | ICD-10-CM

## 2011-08-09 DIAGNOSIS — M810 Age-related osteoporosis without current pathological fracture: Secondary | ICD-10-CM

## 2011-08-09 DIAGNOSIS — Z23 Encounter for immunization: Secondary | ICD-10-CM

## 2011-08-09 DIAGNOSIS — E559 Vitamin D deficiency, unspecified: Secondary | ICD-10-CM

## 2011-08-09 DIAGNOSIS — IMO0001 Reserved for inherently not codable concepts without codable children: Secondary | ICD-10-CM

## 2011-08-09 DIAGNOSIS — Z79899 Other long term (current) drug therapy: Secondary | ICD-10-CM

## 2011-08-09 DIAGNOSIS — Z0000 Encounter for general adult medical examination without abnormal findings: Secondary | ICD-10-CM

## 2011-08-09 DIAGNOSIS — R7309 Other abnormal glucose: Secondary | ICD-10-CM

## 2011-08-09 DIAGNOSIS — I10 Essential (primary) hypertension: Secondary | ICD-10-CM

## 2011-08-09 DIAGNOSIS — M797 Fibromyalgia: Secondary | ICD-10-CM

## 2011-08-09 DIAGNOSIS — E119 Type 2 diabetes mellitus without complications: Secondary | ICD-10-CM

## 2011-08-09 LAB — CBC WITH DIFFERENTIAL/PLATELET
Basophils Absolute: 0.1 10*3/uL (ref 0.0–0.1)
Basophils Relative: 1 % (ref 0–1)
Eosinophils Absolute: 0.2 10*3/uL (ref 0.0–0.7)
Hemoglobin: 14 g/dL (ref 12.0–15.0)
MCH: 32.2 pg (ref 26.0–34.0)
MCHC: 32.6 g/dL (ref 30.0–36.0)
Monocytes Absolute: 0.6 10*3/uL (ref 0.1–1.0)
Monocytes Relative: 8 % (ref 3–12)
Neutro Abs: 4.6 10*3/uL (ref 1.7–7.7)
Neutrophils Relative %: 62 % (ref 43–77)
RDW: 13.4 % (ref 11.5–15.5)

## 2011-08-09 LAB — LIPID PANEL
HDL: 59 mg/dL (ref >39)
LDL Cholesterol: 114 mg/dL — ABNORMAL HIGH (ref 0–99)
Total CHOL/HDL Ratio: 3.2 Ratio
Triglycerides: 90 mg/dL (ref <150)
VLDL: 18 mg/dL (ref 0–40)

## 2011-08-09 LAB — COMPREHENSIVE METABOLIC PANEL
AST: 7 U/L (ref 0–37)
Albumin: 4.5 g/dL (ref 3.5–5.2)
Alkaline Phosphatase: 63 U/L (ref 39–117)
BUN: 27 mg/dL — ABNORMAL HIGH (ref 6–23)
Glucose, Bld: 94 mg/dL (ref 70–99)
Potassium: 4.3 mEq/L (ref 3.5–5.3)
Total Bilirubin: 0.8 mg/dL (ref 0.3–1.2)

## 2011-08-09 LAB — TSH: TSH: 2.223 u[IU]/mL (ref 0.350–4.500)

## 2011-08-10 LAB — HEMOGLOBIN A1C
Hgb A1c MFr Bld: 5.6 % (ref <5.7)
Mean Plasma Glucose: 114 mg/dL (ref <117)

## 2011-08-10 LAB — THYROID ANTIBODIES: Thyroperoxidase Ab SerPl-aCnc: <10 IU/mL (ref <35.0)

## 2011-08-26 ENCOUNTER — Encounter: Payer: Self-pay | Admitting: Internal Medicine

## 2011-08-26 DIAGNOSIS — E611 Iron deficiency: Secondary | ICD-10-CM | POA: Insufficient documentation

## 2011-08-26 DIAGNOSIS — E876 Hypokalemia: Secondary | ICD-10-CM | POA: Insufficient documentation

## 2011-08-26 DIAGNOSIS — J309 Allergic rhinitis, unspecified: Secondary | ICD-10-CM | POA: Insufficient documentation

## 2011-08-26 DIAGNOSIS — R413 Other amnesia: Secondary | ICD-10-CM | POA: Insufficient documentation

## 2011-08-26 DIAGNOSIS — M810 Age-related osteoporosis without current pathological fracture: Secondary | ICD-10-CM | POA: Insufficient documentation

## 2011-08-26 DIAGNOSIS — M797 Fibromyalgia: Secondary | ICD-10-CM | POA: Insufficient documentation

## 2011-08-26 DIAGNOSIS — I10 Essential (primary) hypertension: Secondary | ICD-10-CM | POA: Insufficient documentation

## 2011-08-26 DIAGNOSIS — K219 Gastro-esophageal reflux disease without esophagitis: Secondary | ICD-10-CM | POA: Insufficient documentation

## 2011-08-26 NOTE — Progress Notes (Signed)
Subjective:    Patient ID: Donna Woods, female    DOB: February 23, 1934, 77 y.o.   MRN: [id number]  HPI 77 year old white female mother of [place] in today for annual physical exam. Patient was hospitalized December 2010 with a meningitis-type process it started with a headache. 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 It is felt that she had a viral syndrome and was discharged from the hospital but became worse and returned with increasing confusion, headache, and meningismus. She was placed on antibiotics, improved, was taken off antibiotics and worsened within 48 hours. She developed a left hemiparesthesias and confusion. MRI showed a proteinaceous exudate on the surface of the brain, right side greater than left. She had infectious disease consults and antibiotic coverage continue. She improved and subsequently went to rehabilitation. She has significant memory impairment and almost appears to have some early dementia. Short term memory remains a problem. She has a gait disorder. She requires some assistance at times. Tends to fall backward easily.  Past history includes knee replacement 2005, total hysterectomy approximately 1968, cholecystectomy approximately 1969. History of seasonal allergies, arthritis, palpitations. She has never smoked and does not drink alcohol. She is retired Chartered loss adjuster. History of GE reflux, osteoporosis and fibromyalgia. History of vitamin D deficiency. History of chronic venous stasis and stasis dermatitis. History of hypertension. History of left inguinal hernia  Family history: Father died at age 70 of respiratory failure, mother died at age 40 of neurological disease. One brother age 51 with a neurological disorder (CMT disease ) another brother in good health and a sister in good health. 2 adult daughters in good health.    Review of Systems  Constitutional: Negative.   HENT: Negative.   Eyes: Negative.   Respiratory: Negative.     Cardiovascular: Negative.   Gastrointestinal: Negative.   Genitourinary: Negative.   Musculoskeletal: Positive for gait problem.  Neurological: Negative for weakness.       Some ataxia and gait disturbance. Short term memory issues.  Psychiatric/Behavioral: Negative for decreased concentration.       Objective:   Physical Exam  Vitals reviewed. Constitutional: She is oriented to person, place, and time. She appears well-nourished.  HENT:  Head: Normocephalic and atraumatic.  Right Ear: External ear normal.  Left Ear: External ear normal.  Mouth/Throat: Oropharynx is clear and moist.  Eyes: Pupils are equal, round, and reactive to light.  Neck: Neck supple. No JVD present. No thyromegaly present.  Cardiovascular: Regular rhythm and normal heart sounds.   No murmur heard. Pulmonary/Chest: Effort normal and breath sounds normal. She has no wheezes.       Breasts normal female  Abdominal: Soft. Bowel sounds are normal. She exhibits no mass. There is no tenderness.  Musculoskeletal: She exhibits no edema.  Neurological: She is alert and oriented to person, place, and time. She has normal reflexes. She displays normal reflexes. No cranial nerve deficit.  Skin:       Senile purpura  Psychiatric:       Short-term memory deficits          Assessment & Plan:  History of meningitis-type syndrome 2010 with resultant short-term memory deficits and gait disorder. tends to lose balance easily  Hypokalemia secondary to diuretic therapy  History of iron deficiency diagnosed July 2011 and treat successfully with iron supplementation  Prediabetes  Hypertension  GE reflux  History of fibromyalgia syndrome  Allergic rhinitis  Osteoporosis-intolerant to bisphosphonate therapy  Patient doing about as well as to be expected  given severity of illness in 2010. History turn in 6 months for office visit blood pressure check and hemoglobin A1c.

## 2011-09-04 ENCOUNTER — Other Ambulatory Visit: Payer: Self-pay | Admitting: Internal Medicine

## 2011-09-06 ENCOUNTER — Telehealth: Payer: Self-pay | Admitting: Internal Medicine

## 2011-09-06 NOTE — Telephone Encounter (Signed)
Pt advised that her thyroid scan showed mild inflammation and that all of her laboratory studies were normal.  Advised patient we were scheduling her an appt with an endocrinologist and would advise her of the date and time once the appt had been obtained.  Pt verbalized understanding.

## 2011-11-19 ENCOUNTER — Encounter: Payer: Self-pay | Admitting: Internal Medicine

## 2011-12-03 DIAGNOSIS — M1710 Unilateral primary osteoarthritis, unspecified knee: Secondary | ICD-10-CM | POA: Diagnosis not present

## 2011-12-03 DIAGNOSIS — IMO0001 Reserved for inherently not codable concepts without codable children: Secondary | ICD-10-CM | POA: Diagnosis not present

## 2011-12-03 DIAGNOSIS — M76899 Other specified enthesopathies of unspecified lower limb, excluding foot: Secondary | ICD-10-CM | POA: Diagnosis not present

## 2011-12-03 DIAGNOSIS — M19049 Primary osteoarthritis, unspecified hand: Secondary | ICD-10-CM | POA: Diagnosis not present

## 2011-12-06 ENCOUNTER — Other Ambulatory Visit: Payer: Self-pay | Admitting: Internal Medicine

## 2011-12-07 ENCOUNTER — Telehealth: Payer: Self-pay

## 2011-12-07 MED ORDER — AZITHROMYCIN 250 MG PO TABS: ORAL_TABLET | ORAL | Status: AC

## 2011-12-07 NOTE — Telephone Encounter (Signed)
Call in Zithromax Z pak 2 tabs po day 1 then 1 tab po days 2-5. See next week if no better. MJB

## 2011-12-07 NOTE — Telephone Encounter (Signed)
Has uri with post nasal drip, mild cough, lot of mucus in back of throat- no fever. Doesn't really want to come in.; lives in [place], using robitussin and/or delsym.

## 2011-12-07 NOTE — Telephone Encounter (Signed)
Rx called to [facility] in [place].

## 2011-12-09 DIAGNOSIS — J00 Acute nasopharyngitis [common cold]: Secondary | ICD-10-CM

## 2011-12-21 ENCOUNTER — Other Ambulatory Visit: Payer: Self-pay | Admitting: Internal Medicine

## 2012-01-23 ENCOUNTER — Other Ambulatory Visit: Payer: Self-pay | Admitting: Internal Medicine

## 2012-01-29 ENCOUNTER — Ambulatory Visit (INDEPENDENT_AMBULATORY_CARE_PROVIDER_SITE_OTHER): Payer: Medicare Other | Admitting: Internal Medicine

## 2012-01-29 ENCOUNTER — Encounter: Payer: Self-pay | Admitting: Internal Medicine

## 2012-01-29 VITALS — BP 140/74 | HR 52 | Temp 97.2°F | Wt 150.0 lb

## 2012-01-29 DIAGNOSIS — K4090 Unilateral inguinal hernia, without obstruction or gangrene, not specified as recurrent: Secondary | ICD-10-CM | POA: Diagnosis not present

## 2012-01-29 NOTE — Progress Notes (Signed)
  Subjective:    Patient ID: Donna Woods, female    DOB: October 14, 1934, 78 y.o.   MRN: [id number]  HPI Patient in today saying that hernia left groin is getting bigger. This has been present before her hospitalization in 2010 for a meningitis-type process. In fact she was considering having it repaired just before she became ill in 2010. Feels a bit of a stinging sensation in left groin. No frank pain. Is also concerned this could be a hip process.    Review of Systems     Objective:   Physical Exam patient has bulge in left groin consistent with hernia. It is reducible. Internal and external rotation of left hip reveals no acute pain.        Assessment & Plan:  Left inguinal hernia  Plan: Patient accompanied by her husband today. I spoke with both of them at the same time. In 2010, they had considered having surgeon in [place]  repair the hernia. They may want to go see him and get an opinion as to whether it really not repair is really necessary.

## 2012-01-29 NOTE — Patient Instructions (Signed)
We have copied your medical records for you to take to your surgeon in [place]. This may be of help in evaluating your left inguinal hernia.

## 2012-02-04 ENCOUNTER — Other Ambulatory Visit: Payer: Self-pay | Admitting: Internal Medicine

## 2012-02-04 DIAGNOSIS — E042 Nontoxic multinodular goiter: Secondary | ICD-10-CM

## 2012-02-05 ENCOUNTER — Telehealth: Payer: Self-pay | Admitting: Internal Medicine

## 2012-02-06 NOTE — Telephone Encounter (Signed)
Can wait until Sept just have patient remind [place].

## 2012-02-07 NOTE — Telephone Encounter (Signed)
Advised patient she could reschedule [place] for Sept. 2013.  She has current appt and was going to try to reschedule it for Sept and if not she will call us to reschedule.

## 2012-02-13 ENCOUNTER — Other Ambulatory Visit: Payer: Medicare Other

## 2012-02-15 ENCOUNTER — Ambulatory Visit
Admission: RE | Admit: 2012-02-15 | Discharge: 2012-02-15 | Disposition: A | Discharge status: Home / Self Care | Payer: Medicare Other | Source: Ambulatory Visit | Attending: Internal Medicine | Admitting: Internal Medicine

## 2012-02-15 DIAGNOSIS — E079 Disorder of thyroid, unspecified: Secondary | ICD-10-CM | POA: Diagnosis not present

## 2012-02-15 DIAGNOSIS — E042 Nontoxic multinodular goiter: Secondary | ICD-10-CM

## 2012-03-03 ENCOUNTER — Other Ambulatory Visit: Payer: Self-pay | Admitting: Internal Medicine

## 2012-04-15 ENCOUNTER — Other Ambulatory Visit: Payer: Self-pay | Admitting: Internal Medicine

## 2012-04-15 ENCOUNTER — Other Ambulatory Visit: Payer: Self-pay

## 2012-04-15 MED ORDER — OMEPRAZOLE 20 MG PO CPDR: 20.0000 mg | DELAYED_RELEASE_CAPSULE | Freq: Every day | ORAL | Status: DC

## 2012-05-08 DIAGNOSIS — R002 Palpitations: Secondary | ICD-10-CM | POA: Diagnosis not present

## 2012-05-14 DIAGNOSIS — M19049 Primary osteoarthritis, unspecified hand: Secondary | ICD-10-CM | POA: Diagnosis not present

## 2012-05-14 DIAGNOSIS — M76899 Other specified enthesopathies of unspecified lower limb, excluding foot: Secondary | ICD-10-CM | POA: Diagnosis not present

## 2012-05-14 DIAGNOSIS — M1710 Unilateral primary osteoarthritis, unspecified knee: Secondary | ICD-10-CM | POA: Diagnosis not present

## 2012-05-31 ENCOUNTER — Other Ambulatory Visit: Payer: Self-pay | Admitting: Internal Medicine

## 2012-06-11 ENCOUNTER — Other Ambulatory Visit: Payer: Self-pay | Admitting: Internal Medicine

## 2012-06-16 DIAGNOSIS — Z79899 Other long term (current) drug therapy: Secondary | ICD-10-CM | POA: Diagnosis not present

## 2012-07-07 DIAGNOSIS — L821 Other seborrheic keratosis: Secondary | ICD-10-CM | POA: Diagnosis not present

## 2012-07-29 DIAGNOSIS — H11059 Peripheral pterygium, progressive, unspecified eye: Secondary | ICD-10-CM | POA: Diagnosis not present

## 2012-07-29 DIAGNOSIS — H43399 Other vitreous opacities, unspecified eye: Secondary | ICD-10-CM | POA: Diagnosis not present

## 2012-07-29 DIAGNOSIS — H2510 Age-related nuclear cataract, unspecified eye: Secondary | ICD-10-CM | POA: Diagnosis not present

## 2012-07-29 DIAGNOSIS — H02839 Dermatochalasis of unspecified eye, unspecified eyelid: Secondary | ICD-10-CM | POA: Diagnosis not present

## 2012-08-29 ENCOUNTER — Ambulatory Visit (INDEPENDENT_AMBULATORY_CARE_PROVIDER_SITE_OTHER): Payer: Medicare Other | Admitting: Internal Medicine

## 2012-08-29 ENCOUNTER — Encounter: Payer: Self-pay | Admitting: Internal Medicine

## 2012-08-29 VITALS — BP 138/66 | HR 42 | Temp 97.7°F | Wt 150.0 lb

## 2012-08-29 DIAGNOSIS — J069 Acute upper respiratory infection, unspecified: Secondary | ICD-10-CM | POA: Diagnosis not present

## 2012-09-01 ENCOUNTER — Ambulatory Visit: Payer: Medicare Other | Admitting: Internal Medicine

## 2012-09-09 DIAGNOSIS — Z23 Encounter for immunization: Secondary | ICD-10-CM | POA: Diagnosis not present

## 2012-09-16 ENCOUNTER — Encounter: Payer: Self-pay | Admitting: Internal Medicine

## 2012-09-16 NOTE — Patient Instructions (Addendum)
Take Zithromax Z pak as prescribed. Cal if not better in 10 days

## 2012-09-16 NOTE — Progress Notes (Signed)
  Subjective:    Patient ID: Donna Woods, female    DOB: Jan 04, 1934, 78 y.o.   MRN: [id number]  HPI 78 year old white female with history of prolonged hospitalization for meningitis-type process that resulted in short-term memory loss which has persisted. Would appear to have some early dementia. It was felt she possibly could of had posterior meningitis. She has a gait disorder.  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   Recent URI type symptoms with sinus congestion and stuffy nose.    Review of Systems     Objective:   Physical Exam skin is warm and dry; chest clear to auscultation; cardiac exam regular rate and rhythm; extremities without edema. Slightly boggy nasal mucosa. Pharynx slightly injected. TMs are clear. Neck is supple without significant adenopathy.        Assessment & Plan:  URI  Plan: Zithromax Z-Pak take 2 tablets day one followed by 1 tablet days 2 through 5. Call if not better in 7-10 days or sooner if worse.

## 2012-09-18 ENCOUNTER — Other Ambulatory Visit: Payer: Self-pay | Admitting: Internal Medicine

## 2012-09-24 ENCOUNTER — Other Ambulatory Visit: Payer: Self-pay | Admitting: Internal Medicine

## 2012-09-30 DIAGNOSIS — K4090 Unilateral inguinal hernia, without obstruction or gangrene, not specified as recurrent: Secondary | ICD-10-CM | POA: Insufficient documentation

## 2012-10-13 DIAGNOSIS — Z791 Long term (current) use of non-steroidal anti-inflammatories (NSAID): Secondary | ICD-10-CM | POA: Diagnosis not present

## 2012-10-13 DIAGNOSIS — Z880 Allergy status to penicillin: Secondary | ICD-10-CM | POA: Diagnosis not present

## 2012-10-13 DIAGNOSIS — Z9109 Other allergy status, other than to drugs and biological substances: Secondary | ICD-10-CM | POA: Diagnosis not present

## 2012-10-13 DIAGNOSIS — Z79899 Other long term (current) drug therapy: Secondary | ICD-10-CM | POA: Diagnosis not present

## 2012-10-13 DIAGNOSIS — K4030 Unilateral inguinal hernia, with obstruction, without gangrene, not specified as recurrent: Secondary | ICD-10-CM | POA: Diagnosis not present

## 2012-10-13 DIAGNOSIS — T7840XA Allergy, unspecified, initial encounter: Secondary | ICD-10-CM | POA: Diagnosis not present

## 2012-10-13 DIAGNOSIS — M810 Age-related osteoporosis without current pathological fracture: Secondary | ICD-10-CM | POA: Diagnosis not present

## 2012-10-13 DIAGNOSIS — I498 Other specified cardiac arrhythmias: Secondary | ICD-10-CM | POA: Diagnosis not present

## 2012-10-13 DIAGNOSIS — K4090 Unilateral inguinal hernia, without obstruction or gangrene, not specified as recurrent: Secondary | ICD-10-CM | POA: Diagnosis not present

## 2012-11-24 DIAGNOSIS — G4700 Insomnia, unspecified: Secondary | ICD-10-CM | POA: Diagnosis not present

## 2012-11-24 DIAGNOSIS — R5381 Other malaise: Secondary | ICD-10-CM | POA: Diagnosis not present

## 2012-11-24 DIAGNOSIS — IMO0001 Reserved for inherently not codable concepts without codable children: Secondary | ICD-10-CM | POA: Diagnosis not present

## 2012-11-24 DIAGNOSIS — M810 Age-related osteoporosis without current pathological fracture: Secondary | ICD-10-CM | POA: Diagnosis not present

## 2012-11-24 DIAGNOSIS — R5383 Other fatigue: Secondary | ICD-10-CM | POA: Diagnosis not present

## 2012-11-27 DIAGNOSIS — Z1231 Encounter for screening mammogram for malignant neoplasm of breast: Secondary | ICD-10-CM | POA: Diagnosis not present

## 2012-11-28 DIAGNOSIS — Z1231 Encounter for screening mammogram for malignant neoplasm of breast: Secondary | ICD-10-CM | POA: Diagnosis not present

## 2012-12-09 DIAGNOSIS — M899 Disorder of bone, unspecified: Secondary | ICD-10-CM | POA: Diagnosis not present

## 2012-12-09 DIAGNOSIS — Z780 Asymptomatic menopausal state: Secondary | ICD-10-CM | POA: Diagnosis not present

## 2012-12-09 DIAGNOSIS — Z13820 Encounter for screening for osteoporosis: Secondary | ICD-10-CM | POA: Diagnosis not present

## 2012-12-09 DIAGNOSIS — N959 Unspecified menopausal and perimenopausal disorder: Secondary | ICD-10-CM | POA: Diagnosis not present

## 2013-01-19 ENCOUNTER — Other Ambulatory Visit: Payer: Self-pay | Admitting: Internal Medicine

## 2013-01-19 ENCOUNTER — Ambulatory Visit (INDEPENDENT_AMBULATORY_CARE_PROVIDER_SITE_OTHER): Payer: Medicare Other | Admitting: Internal Medicine

## 2013-01-19 ENCOUNTER — Encounter: Payer: Self-pay | Admitting: Internal Medicine

## 2013-01-19 VITALS — BP 136/72 | HR 68 | Temp 98.1°F | Ht 64.5 in | Wt 152.0 lb

## 2013-01-19 DIAGNOSIS — I10 Essential (primary) hypertension: Secondary | ICD-10-CM

## 2013-01-19 DIAGNOSIS — R7309 Other abnormal glucose: Secondary | ICD-10-CM

## 2013-01-19 DIAGNOSIS — M810 Age-related osteoporosis without current pathological fracture: Secondary | ICD-10-CM | POA: Diagnosis not present

## 2013-01-19 DIAGNOSIS — R413 Other amnesia: Secondary | ICD-10-CM | POA: Diagnosis not present

## 2013-01-19 DIAGNOSIS — Z862 Personal history of diseases of the blood and blood-forming organs and certain disorders involving the immune mechanism: Secondary | ICD-10-CM

## 2013-01-19 DIAGNOSIS — Z23 Encounter for immunization: Secondary | ICD-10-CM

## 2013-01-19 DIAGNOSIS — M25559 Pain in unspecified hip: Secondary | ICD-10-CM

## 2013-01-19 DIAGNOSIS — Z8639 Personal history of other endocrine, nutritional and metabolic disease: Secondary | ICD-10-CM

## 2013-01-19 DIAGNOSIS — R7303 Prediabetes: Secondary | ICD-10-CM

## 2013-01-19 DIAGNOSIS — M25569 Pain in unspecified knee: Secondary | ICD-10-CM

## 2013-01-19 DIAGNOSIS — K219 Gastro-esophageal reflux disease without esophagitis: Secondary | ICD-10-CM

## 2013-01-19 LAB — COMPREHENSIVE METABOLIC PANEL
Albumin: 4.5 g/dL (ref 3.5–5.2)
BUN: 29 mg/dL — ABNORMAL HIGH (ref 6–23)
CO2: 26 mEq/L (ref 19–32)
Calcium: 9.6 mg/dL (ref 8.4–10.5)
Glucose, Bld: 102 mg/dL — ABNORMAL HIGH (ref 70–99)
Potassium: 3.8 mEq/L (ref 3.5–5.3)
Sodium: 143 mEq/L (ref 135–145)
Total Protein: 6.4 g/dL (ref 6.0–8.3)

## 2013-01-19 LAB — HEMOGLOBIN A1C
Hgb A1c MFr Bld: 5.7 % — ABNORMAL HIGH (ref <5.7)
Mean Plasma Glucose: 117 mg/dL — ABNORMAL HIGH (ref <117)

## 2013-01-19 LAB — POCT URINALYSIS DIPSTICK
Bilirubin, UA: NEGATIVE
Glucose, UA: NEGATIVE
Ketones, UA: NEGATIVE
Spec Grav, UA: 1.015
Urobilinogen, UA: NEGATIVE

## 2013-01-19 LAB — LIPID PANEL
Cholesterol: 179 mg/dL (ref 0–200)
Triglycerides: 104 mg/dL (ref <150)

## 2013-01-19 LAB — CBC WITH DIFFERENTIAL/PLATELET
Basophils Relative: 1 % (ref 0–1)
Hemoglobin: 13.6 g/dL (ref 12.0–15.0)
Lymphs Abs: 1.7 10*3/uL (ref 0.7–4.0)
MCHC: 34.6 g/dL (ref 30.0–36.0)
Monocytes Relative: 9 % (ref 3–12)
Neutro Abs: 4.9 10*3/uL (ref 1.7–7.7)
Neutrophils Relative %: 65 % (ref 43–77)
RBC: 4.22 MIL/uL (ref 3.87–5.11)

## 2013-01-19 MED ORDER — PNEUMOCOCCAL VAC POLYVALENT 25 MCG/0.5ML IJ INJ: 0.5000 mL | INJECTION | Freq: Once | INTRAMUSCULAR | Status: DC

## 2013-01-19 NOTE — Progress Notes (Signed)
Subjective:    Patient ID: Donna Woods, female    DOB: [birth year], 79 y.o.   MRN: [id number]  HPI 79 year old White female for health maintenance and evaluation of medical problems. Patient was hospitalized in December 2010 with a meningitis-type process. He started with a headache. 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 It was thought that she had a viral syndrome. She was discharged from the hospital but became worse. She returned to the hospital with increasing confusion, headache, and meningismus. She was placed on antibiotics after which she improved. She was taken off antibiotics and worsened within 48 hours. She developed a left hemiparesthesias and confusion. MRI showed a proteinaceous exudate on the surface of the brain right side greater than the left. She had infectious disease consultation and antibiotic coverage continued. She slowly improved. Subsequently went to rehabilitation. Since then, she has had significant memory impairment and almost appears to have some early dementia. Short term memory remains a problem. She has a gait disorder. She requires some assistance at times tabulate. Tends to fall backward easily.  Past medical history includes knee replacement in 2005, total hysterectomy approximately 1968, cholecystectomy approximately 1969. History of seasonal allergies, arthritis, palpitations.  Nonsmoker. No alcohol consumption.  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 2 adult daughters. Husband is very supportive.  History of GE reflux, osteoporosis, fibromyalgia. History of vitamin D deficiency. History of chronic venous stasis and stasis dermatitis. History of hypertension and history of left inguinal hernia.  Family history: Father died at age 68 of respiratory failure, mother died at 74 of neurological disease. One brother age 87 with a neurological disorder (CMT disease) another brother in good health and a sister in good  health. One adult daughter with hypertension.      Review of Systems  Constitutional: Negative.   HENT: Negative.        Tinnitus- not too bothersome . Hx allergic rhinitis takes antihistamine  Eyes: Negative.        See eye doctor in [place].  Respiratory: Negative.   Cardiovascular: Negative.   Gastrointestinal: Negative.   Endocrine: Negative.   Genitourinary: Negative.   Allergic/Immunologic:       Dust mites ragweed and dust- allergy tested years ago  Neurological:       Memory issues since hospitalization 2010  Hematological: Negative.   Psychiatric/Behavioral: Negative.        Objective:   Physical Exam  Vitals reviewed. Constitutional: She is oriented to person, place, and time. She appears well-developed and well-nourished. No distress.  HENT:  Head: Normocephalic and atraumatic.  Right Ear: External ear normal.  Left Ear: External ear normal.  Mouth/Throat: Oropharynx is clear and moist. No oropharyngeal exudate.  Eyes: Conjunctivae are normal. Pupils are equal, round, and reactive to light. Right eye exhibits no discharge. Left eye exhibits no discharge.  Neck: Neck supple. No JVD present. No thyromegaly present.  Cardiovascular: Normal rate, regular rhythm, normal heart sounds and intact distal pulses.   No murmur heard. Pulmonary/Chest: Effort normal and breath sounds normal. No respiratory distress. She has no wheezes. She has no rales. She exhibits no tenderness.  Abdominal: Soft. Bowel sounds are normal. She exhibits no mass. There is no tenderness. There is no rebound and no guarding.  Genitourinary:  Deferred  Musculoskeletal: Normal range of motion. She exhibits no edema.  Lymphadenopathy:    She has no cervical adenopathy.  Neurological: She is alert and oriented to person, place, and time. She  has normal reflexes. No cranial nerve deficit. Coordination normal.  Skin: Skin is warm and dry. She is not diaphoretic.  Psychiatric: She has a normal  mood and affect. Her behavior is normal. Thought content normal.          Assessment & Plan:  Hx of encephalitis type illness 2010 with resultant prolonged rehab and some memory issues. Pt has improved and is stable but still has some mild memory issues. Do not feel that this is Altzheimers type of dementia.  Hypertension-well controlled  Osteoporosis  GE reflux  Osteoarthritis  Impaired glucose tolerance  Gait instability  Short-term memory loss  History of iron deficiency  History of vitamin D deficiency  Plan: Continue same medications and return in 6 months. Continue Tenormin. Continue Mobic for osteoarthritis. Continue Maxzide. Continue iron supplement once daily. Watch carbohydrates with impaired glucose tolerance.       Subjective:   Patient presents for Medicare Annual/Subsequent preventive examination.   Review Past Medical/Family/Social: see EPIC   Risk Factors  Current exercise habits: sedentary but walk dog some. Some light exercise at home with sit ups and leg lifts Dietary issues discussed: low fat low carb  Cardiac risk factors: prediabetes  Depression Screen  (Note: if answer to either of the following is "Yes", a more complete depression screening is indicated)   Over the past two weeks, have you felt down, depressed or hopeless? No  Over the past two weeks, have you felt little interest or pleasure in doing things? No Have you lost interest or pleasure in daily life? No Do you often feel hopeless? No Do you cry easily over simple problems? No   Activities of Daily Living  In your present state of health, do you have any difficulty performing the following activities?:   Driving? No  Managing money? No  Feeding yourself? No  Getting from bed to chair? No  Climbing a flight of stairs? No  Preparing food and eating?: No  Bathing or showering? No  Getting dressed: No  Getting to the toilet? No  Using the toilet:No  Moving around from  place to place: No  In the past year have you fallen or had a near fall?:No  Are you sexually active? No  Do you have more than one partner? No   Hearing Difficulties: No  Do you often ask people to speak up or repeat themselves? No  Do you experience ringing or noises in your ears? yes Do you have difficulty understanding soft or whispered voices? No  Do you feel that you have a problem with memory? yes Do you often misplace items? No    Home Safety:  Do you have a smoke alarm at your residence? Yes Do you have grab bars in the bathroom? yes Do you have throw rugs in your house? yes   Cognitive Testing  Alert? Yes Normal Appearance?Yes  Oriented to person? Yes Place? Yes  Time? Yes  Recall of three objects? Yes  Can perform simple calculations? Yes  Displays appropriate judgment?Yes  Can read the correct time from a watch face?Yes   List the Names of Other Physician/Practitioners you currently use:  See referral list for the physicians patient is currently seeing. Opthalmology    Review of Systems:   Objective:     General appearance: Appears stated age and mildly obese  Head: Normocephalic, without obvious abnormality, atraumatic  Eyes: conj clear, EOMi PEERLA  Ears: normal TM's and external ear canals both ears  Nose: Nares  normal. Septum midline. Mucosa normal. No drainage or sinus tenderness.  Throat: lips, mucosa, and tongue normal; teeth and gums normal  Neck: no adenopathy, no carotid bruit, no JVD, supple, symmetrical, trachea midline and thyroid not enlarged, symmetric, no tenderness/mass/nodules  No CVA tenderness.  Lungs: clear to auscultation bilaterally  Breasts: normal appearance, no masses or tenderness. Heart: regular rate and rhythm, S1, S2 normal, no murmur, click, rub or gallop  Abdomen: soft, non-tender; bowel sounds normal; no masses, no organomegaly  Musculoskeletal: ROM normal in all joints, no crepitus, no deformity, Normal muscle  strengthen. Back  is symmetric, no curvature. Skin: Skin color, texture, turgor normal. No rashes or lesions  Lymph nodes: Cervical, supraclavicular, and axillary nodes normal.  Neurologic: CN 2 -12 Normal, Normal symmetric reflexes. Normal coordination and gait  Psych: Alert & Oriented x 3, Mood appear stable.    Assessment:    Annual wellness medicare exam   Plan:    During the course of the visit the patient was educated and counseled about appropriate screening and preventive services including:   Zostavax and pneumovax. Mammogram     Patient Instructions (the written plan) was given to the patient.  Medicare Attestation  I have personally reviewed:  The patient's medical and social history  Their use of alcohol, tobacco or illicit drugs  Their current medications and supplements  The patient's functional ability including ADLs,fall risks, home safety risks, cognitive, and hearing and visual impairment  Diet and physical activities  Evidence for depression or mood disorders  The patient's weight, height, BMI, and visual acuity have been recorded in the chart. I have made referrals, counseling, and provided education to the patient based on review of the above and I have provided the patient with a written personalized care plan for preventive services.

## 2013-01-20 LAB — VITAMIN B12: Vitamin B-12: 758 pg/mL (ref 211–911)

## 2013-01-20 LAB — VITAMIN D 25 HYDROXY (VIT D DEFICIENCY, FRACTURES): Vit D, 25-Hydroxy: 29 ng/mL — ABNORMAL LOW (ref 30–89)

## 2013-02-08 ENCOUNTER — Encounter: Payer: Self-pay | Admitting: Internal Medicine

## 2013-02-09 NOTE — Patient Instructions (Addendum)
Continue same medications and return in 6 months. Watch diet with history of impaired glucose tolerance.

## 2013-02-12 DIAGNOSIS — M948X9 Other specified disorders of cartilage, unspecified sites: Secondary | ICD-10-CM | POA: Diagnosis not present

## 2013-04-22 DIAGNOSIS — E559 Vitamin D deficiency, unspecified: Secondary | ICD-10-CM | POA: Diagnosis not present

## 2013-04-22 DIAGNOSIS — Z79899 Other long term (current) drug therapy: Secondary | ICD-10-CM | POA: Diagnosis not present

## 2013-04-22 DIAGNOSIS — M1710 Unilateral primary osteoarthritis, unspecified knee: Secondary | ICD-10-CM | POA: Diagnosis not present

## 2013-04-22 DIAGNOSIS — M19049 Primary osteoarthritis, unspecified hand: Secondary | ICD-10-CM | POA: Diagnosis not present

## 2013-04-22 DIAGNOSIS — IMO0001 Reserved for inherently not codable concepts without codable children: Secondary | ICD-10-CM | POA: Diagnosis not present

## 2013-04-22 DIAGNOSIS — M949 Disorder of cartilage, unspecified: Secondary | ICD-10-CM | POA: Diagnosis not present

## 2013-05-11 ENCOUNTER — Other Ambulatory Visit: Payer: Self-pay | Admitting: Internal Medicine

## 2013-05-22 ENCOUNTER — Other Ambulatory Visit: Payer: Self-pay | Admitting: Cardiovascular Disease

## 2013-06-15 DIAGNOSIS — D235 Other benign neoplasm of skin of trunk: Secondary | ICD-10-CM | POA: Diagnosis not present

## 2013-06-15 DIAGNOSIS — L821 Other seborrheic keratosis: Secondary | ICD-10-CM | POA: Diagnosis not present

## 2013-06-25 ENCOUNTER — Encounter: Payer: Self-pay | Admitting: Cardiovascular Disease

## 2013-06-25 ENCOUNTER — Ambulatory Visit (INDEPENDENT_AMBULATORY_CARE_PROVIDER_SITE_OTHER): Payer: Medicare Other | Admitting: Cardiovascular Disease

## 2013-06-25 VITALS — BP 120/62 | HR 44 | Ht 66.0 in | Wt 152.0 lb

## 2013-06-25 DIAGNOSIS — R220 Localized swelling, mass and lump, head: Secondary | ICD-10-CM | POA: Diagnosis not present

## 2013-06-25 DIAGNOSIS — R221 Localized swelling, mass and lump, neck: Secondary | ICD-10-CM

## 2013-06-25 DIAGNOSIS — I493 Ventricular premature depolarization: Secondary | ICD-10-CM | POA: Insufficient documentation

## 2013-06-25 DIAGNOSIS — I4949 Other premature depolarization: Secondary | ICD-10-CM | POA: Diagnosis not present

## 2013-06-25 NOTE — Progress Notes (Signed)
06/25/2013 Donna Woods   10-26-1934  [id number]  Primary Physician Margaree Mackintosh, MD Primary Cardiologist: Runell Gess MD Donna Woods   HPI:  The patient is delightful, 79 year old, thin appearing, married, Caucasian female mother of 2, grandmother to 6 grandchildren who was formerly a patient of Dr. Lavonne Chick remotely. I saw her back 1-1/2 years ago. She has a history of palpitations related to asymptomatic PVCs, hypertension. She denies chest pain or shortness of breath. Her last stress test performed 5 years ago was nonischemic. She did have a poorly defined neurologic illness which affected her memory and spanned many months requiring prolonged hospitalization and antibiotic therapy which she has for the most part recovered from.saw her a year ago she is asymptomatic. She denies chest pain shortness of breath dizziness or presyncope.      Current Outpatient Prescriptions  Medication Sig Dispense Refill  . albuterol (PROVENTIL HFA;VENTOLIN HFA) 108 (90 BASE) MCG/ACT inhaler Inhale 2 puffs into the lungs every 6 (six) hours as needed.      Marland Kitchen atenolol (TENORMIN) 25 MG tablet TAKE 1/2 TABLET BY MOUTH EVERY DAY  45 tablet  0  . Calcium-Magnesium-Vitamin D (CALCIUM MAGNESIUM PO) Take by mouth daily.      . Cetirizine HCl 10 MG CAPS Take 1 capsule by mouth daily.        Marland Kitchen CITRUS BIOFLAVONOIDS PO Take by mouth daily.      . Coenzyme Q10 (CO Q 10 PO) Take by mouth daily.      Marland Kitchen lidocaine (LIDODERM) 5 % Place 1 patch onto the skin daily. Remove & Discard patch within 12 hours or as directed by MD       . meloxicam (MOBIC) 7.5 MG tablet TAKE 1 TABLET BY MOUTH EVERY DAY WITH FOOD  90 tablet  0  . Multiple Vitamin (MULTIVITAMIN) capsule Take 1 capsule by mouth daily.      . mupirocin ointment (BACTROBAN) 2 % Apply topically as needed.      . NON FORMULARY ALJ (OTC for seasonal allergies)      . omeprazole (PRILOSEC) 20 MG capsule TAKE ONE CAPSULE BY MOUTH DAILY  90  capsule  PRN  . potassium chloride (MICRO-K) 10 MEQ CR capsule Take 10 mEq by mouth 3 (three) times daily.        Marland Kitchen triamterene-hydrochlorothiazide (DYAZIDE) 37.5-25 MG per capsule Take 1 capsule by mouth every morning.       No current facility-administered medications for this visit.    Allergies  Allergen Reactions  . Adhesive (Tape)   . Penicillins     REACTION: rash    History   Social History  . Marital Status: Married    Spouse Name: N/A    Number of Children: N/A  . Years of Education: N/A   Occupational History  . Not on file.   Social History Main Topics  . Smoking status: Never Smoker   . Smokeless tobacco: Not on file  . Alcohol Use: Not on file  . Drug Use: Not on file  . Sexually Active: Not on file   Other Topics Concern  . Not on file   Social History Narrative  . No narrative on file     Review of Systems: General: negative for chills, fever, night sweats or weight changes.  Cardiovascular: negative for chest pain, dyspnea on exertion, edema, orthopnea, palpitations, paroxysmal nocturnal dyspnea or shortness of breath Dermatological: negative for rash Respiratory: negative for cough or wheezing Urologic: negative for  hematuria Abdominal: negative for nausea, vomiting, diarrhea, bright red blood per rectum, melena, or hematemesis Neurologic: negative for visual changes, syncope, or dizziness All other systems reviewed and are otherwise negative except as noted above.    Blood pressure 120/62, pulse 44, height 5\' 6"  (1.676 m), weight 152 lb (68.947 kg).  General appearance: alert and no distress Neck: no adenopathy, no carotid bruit, no JVD, supple, symmetrical, trachea midline, thyroid not enlarged, symmetric, no tenderness/mass/nodules and there is a pulsatile mass in her right neck  associated with her right carotid artery without bruit Lungs: clear to auscultation bilaterally Heart: regular rate and rhythm, S1, S2 normal, no murmur, click, rub  or gallop Extremities: extremities normal, atraumatic, no cyanosis or edema  EKG sinus bradycardia at 44 without ST or T wave changes  ASSESSMENT AND PLAN:   PVC's (premature ventricular contractions) On low-dose beta blocker beta blocker which suppresses this. She is bradycardic but asymptomatic from this      Runell Gess MD [facility], [facility] 06/25/2013 12:26 PM

## 2013-06-25 NOTE — Patient Instructions (Addendum)
  We will see you back in follow up in 12 months  Dr Allyson Sabal has ordered a carotid doppler

## 2013-06-25 NOTE — Assessment & Plan Note (Signed)
On low-dose beta blocker beta blocker which suppresses this. She is bradycardic but asymptomatic from this

## 2013-06-26 ENCOUNTER — Telehealth: Payer: Self-pay | Admitting: Cardiovascular Disease

## 2013-06-26 MED ORDER — ATENOLOL 25 MG PO TABS: 12.5000 mg | ORAL_TABLET | Freq: Every day | ORAL | Status: DC

## 2013-06-26 NOTE — Telephone Encounter (Signed)
Patient was here yesterday and saw Dr. Allyson Sabal.  Forgot to get a refill for her Atenolol.  Patient threw the bottle away so she does not know the strength.  Please call [facility] in [place].

## 2013-06-26 NOTE — Telephone Encounter (Signed)
Returned call and pt informed refill sent.  Pt verbalized understanding and agreed w/ plan.  Also wished pt a Happy Birthday.

## 2013-07-07 ENCOUNTER — Ambulatory Visit (HOSPITAL_COMMUNITY)
Admission: RE | Admit: 2013-07-07 | Discharge: 2013-07-07 | Disposition: A | Discharge status: Home / Self Care | Payer: Medicare Other | Source: Ambulatory Visit | Attending: Cardiovascular Disease | Admitting: Cardiovascular Disease

## 2013-07-07 DIAGNOSIS — R221 Localized swelling, mass and lump, neck: Secondary | ICD-10-CM

## 2013-07-07 DIAGNOSIS — R220 Localized swelling, mass and lump, head: Secondary | ICD-10-CM | POA: Insufficient documentation

## 2013-07-07 NOTE — Progress Notes (Signed)
Carotid Artery Duplex Completed. °Donna Woods ° °

## 2013-07-15 ENCOUNTER — Encounter: Payer: Self-pay | Admitting: *Deleted

## 2013-07-23 ENCOUNTER — Ambulatory Visit (INDEPENDENT_AMBULATORY_CARE_PROVIDER_SITE_OTHER): Payer: Medicare Other | Admitting: Internal Medicine

## 2013-07-23 ENCOUNTER — Encounter: Payer: Self-pay | Admitting: Internal Medicine

## 2013-07-23 VITALS — BP 144/70 | HR 52 | Temp 97.9°F | Wt 151.0 lb

## 2013-07-23 DIAGNOSIS — Z23 Encounter for immunization: Secondary | ICD-10-CM | POA: Diagnosis not present

## 2013-07-23 DIAGNOSIS — Z8659 Personal history of other mental and behavioral disorders: Secondary | ICD-10-CM

## 2013-07-23 DIAGNOSIS — R7309 Other abnormal glucose: Secondary | ICD-10-CM

## 2013-07-23 DIAGNOSIS — I10 Essential (primary) hypertension: Secondary | ICD-10-CM

## 2013-07-23 DIAGNOSIS — R7303 Prediabetes: Secondary | ICD-10-CM

## 2013-07-23 NOTE — Patient Instructions (Addendum)
Continue same medications and return in 6 months for physical examination. 

## 2013-07-23 NOTE — Progress Notes (Signed)
  Subjective:    Patient ID: Donna Woods, female    DOB: 08/12/1934, 79 y.o.   MRN: [id number]  HPI 79 year old White female in today for six-month recheck. She recently saw Dr. Allyson Sabal regarding pulsatile mass right neck which is apparently benign. She had a carotid Doppler done in mid August which essentially was normal. She is on atenolol, HCTZ, Plendil for hypertension. She has some issues with memory loss status post prolonged hospitalization December 2010 for meningitis-type process. She was in rehabilitation for some time. Seems to be doing well in the past couple of years.    Review of Systems     Objective:   Physical Exam Pulsating vessel right neck. This appears to be benign. No JVD. No thyromegaly. Chest clear to auscultation. Cardiac exam regular rate and rhythm normal S1 and S2. Extremities without edema. She is alert and pleasant. Knows Fay Records is  the Economist and knows day of week. Skin warm and dry. Recent carotid Doppler study results reviewed.        Assessment & Plan:  History of dementia status post meningitis-type process 2010  Hypertension-stable  Pulsatile mass right neck which seems to be a superficial vessel. Recent carotid Doppler study was essentially normal  Plan: Influenza immunization given today. Return in 6 months for physical examination. Continue same medications. No labs drawn today.  Time spent with patient 25 minutes.

## 2013-07-30 ENCOUNTER — Other Ambulatory Visit: Payer: Self-pay | Admitting: Internal Medicine

## 2013-09-11 ENCOUNTER — Other Ambulatory Visit: Payer: Self-pay | Admitting: Internal Medicine

## 2013-09-24 DIAGNOSIS — E559 Vitamin D deficiency, unspecified: Secondary | ICD-10-CM | POA: Diagnosis not present

## 2013-09-24 DIAGNOSIS — M19049 Primary osteoarthritis, unspecified hand: Secondary | ICD-10-CM | POA: Diagnosis not present

## 2013-09-24 DIAGNOSIS — M19079 Primary osteoarthritis, unspecified ankle and foot: Secondary | ICD-10-CM | POA: Diagnosis not present

## 2013-09-24 DIAGNOSIS — Z79899 Other long term (current) drug therapy: Secondary | ICD-10-CM | POA: Diagnosis not present

## 2013-09-24 DIAGNOSIS — M1710 Unilateral primary osteoarthritis, unspecified knee: Secondary | ICD-10-CM | POA: Diagnosis not present

## 2013-09-24 DIAGNOSIS — IMO0001 Reserved for inherently not codable concepts without codable children: Secondary | ICD-10-CM | POA: Diagnosis not present

## 2013-09-24 DIAGNOSIS — M5137 Other intervertebral disc degeneration, lumbosacral region: Secondary | ICD-10-CM | POA: Diagnosis not present

## 2013-10-26 DIAGNOSIS — H11059 Peripheral pterygium, progressive, unspecified eye: Secondary | ICD-10-CM | POA: Diagnosis not present

## 2013-10-26 DIAGNOSIS — H2510 Age-related nuclear cataract, unspecified eye: Secondary | ICD-10-CM | POA: Diagnosis not present

## 2013-10-26 DIAGNOSIS — H02839 Dermatochalasis of unspecified eye, unspecified eyelid: Secondary | ICD-10-CM | POA: Diagnosis not present

## 2013-10-26 DIAGNOSIS — H04129 Dry eye syndrome of unspecified lacrimal gland: Secondary | ICD-10-CM | POA: Diagnosis not present

## 2013-11-30 DIAGNOSIS — L8991 Pressure ulcer of unspecified site, stage 1: Secondary | ICD-10-CM | POA: Diagnosis not present

## 2013-11-30 DIAGNOSIS — L89899 Pressure ulcer of other site, unspecified stage: Secondary | ICD-10-CM | POA: Diagnosis not present

## 2014-01-21 ENCOUNTER — Other Ambulatory Visit: Payer: Medicare Other | Admitting: Internal Medicine

## 2014-01-21 ENCOUNTER — Encounter: Payer: Medicare Other | Admitting: Internal Medicine

## 2014-02-23 DIAGNOSIS — Z1231 Encounter for screening mammogram for malignant neoplasm of breast: Secondary | ICD-10-CM | POA: Diagnosis not present

## 2014-02-23 DIAGNOSIS — R922 Inconclusive mammogram: Secondary | ICD-10-CM | POA: Diagnosis not present

## 2014-03-23 DIAGNOSIS — M1710 Unilateral primary osteoarthritis, unspecified knee: Secondary | ICD-10-CM | POA: Diagnosis not present

## 2014-03-23 DIAGNOSIS — M19049 Primary osteoarthritis, unspecified hand: Secondary | ICD-10-CM | POA: Diagnosis not present

## 2014-03-23 DIAGNOSIS — M25549 Pain in joints of unspecified hand: Secondary | ICD-10-CM | POA: Diagnosis not present

## 2014-04-12 DIAGNOSIS — M76899 Other specified enthesopathies of unspecified lower limb, excluding foot: Secondary | ICD-10-CM | POA: Diagnosis not present

## 2014-04-12 DIAGNOSIS — S7000XA Contusion of unspecified hip, initial encounter: Secondary | ICD-10-CM | POA: Diagnosis not present

## 2014-04-12 DIAGNOSIS — M1710 Unilateral primary osteoarthritis, unspecified knee: Secondary | ICD-10-CM | POA: Diagnosis not present

## 2014-04-12 DIAGNOSIS — M25519 Pain in unspecified shoulder: Secondary | ICD-10-CM | POA: Diagnosis not present

## 2014-04-28 ENCOUNTER — Telehealth: Payer: Self-pay | Admitting: Cardiovascular Disease

## 2014-04-28 MED ORDER — ATENOLOL 25 MG PO TABS: 12.5000 mg | ORAL_TABLET | Freq: Every day | ORAL | Status: DC

## 2014-04-28 NOTE — Telephone Encounter (Signed)
Pt need her medicine changed to another pharmacy. Please call her Atenolol to [phone].

## 2014-05-10 ENCOUNTER — Telehealth: Payer: Self-pay | Admitting: Cardiovascular Disease

## 2014-05-11 NOTE — Telephone Encounter (Signed)
Closed encounter °

## 2014-06-18 ENCOUNTER — Other Ambulatory Visit: Payer: Self-pay

## 2014-06-18 ENCOUNTER — Telehealth: Payer: Self-pay | Admitting: Internal Medicine

## 2014-06-18 MED ORDER — OMEPRAZOLE 20 MG PO CPDR: 20.0000 mg | DELAYED_RELEASE_CAPSULE | Freq: Every day | ORAL | Status: DC

## 2014-06-18 NOTE — Telephone Encounter (Signed)
Rx sent as requested.

## 2014-06-18 NOTE — Telephone Encounter (Signed)
Please change Rx to new pharmacy with prn one year refills.

## 2014-06-24 DIAGNOSIS — Z79899 Other long term (current) drug therapy: Secondary | ICD-10-CM | POA: Diagnosis not present

## 2014-06-29 ENCOUNTER — Encounter: Payer: Self-pay | Admitting: Cardiovascular Disease

## 2014-06-29 ENCOUNTER — Ambulatory Visit (INDEPENDENT_AMBULATORY_CARE_PROVIDER_SITE_OTHER): Payer: Medicare Other | Admitting: Cardiovascular Disease

## 2014-06-29 VITALS — BP 126/60 | HR 49 | Ht 67.0 in | Wt 151.0 lb

## 2014-06-29 DIAGNOSIS — I10 Essential (primary) hypertension: Secondary | ICD-10-CM

## 2014-06-29 DIAGNOSIS — I4949 Other premature depolarization: Secondary | ICD-10-CM

## 2014-06-29 DIAGNOSIS — E782 Mixed hyperlipidemia: Secondary | ICD-10-CM

## 2014-06-29 DIAGNOSIS — Z79899 Other long term (current) drug therapy: Secondary | ICD-10-CM

## 2014-06-29 DIAGNOSIS — I493 Ventricular premature depolarization: Secondary | ICD-10-CM

## 2014-06-29 MED ORDER — ATENOLOL 25 MG PO TABS
12.5000 mg | ORAL_TABLET | Freq: Every day | ORAL | Status: DC
Start: 2014-06-29 — End: 2015-04-28

## 2014-06-29 NOTE — Assessment & Plan Note (Signed)
Under good control and her medications 

## 2014-06-29 NOTE — Assessment & Plan Note (Signed)
No longer symptomatic on beta blocker

## 2014-06-29 NOTE — Patient Instructions (Signed)
  We will see you back in follow up in 1 year with Dr Berry.   Dr Berry has ordered: Your physician recommends that you return for a FASTING lipid profile    

## 2014-06-29 NOTE — Progress Notes (Signed)
06/29/2014 Donna Woods   Jul 31, 1934  [id number]  Primary Physician Elby Showers, MD Primary Cardiologist: Lorretta Harp MD [name]   HPI:  The patient is delightful, 80 year old, thin appearing, married, Caucasian female mother of 2, grandmother to 6 grandchildren who was formerly a patient of Dr. Janene Woods remotely. I saw her one year ago. She has a history of palpitations related to asymptomatic PVCs, hypertension. She denies chest pain or shortness of breath. Her last stress test performed 5 years ago was nonischemic. She did have a poorly defined neurologic illness which affected her memory and spanned many months requiring prolonged hospitalization and antibiotic therapy which she has for the most part recovered from.saw her a year ago she is asymptomatic. She denies chest pain shortness of breath dizziness or presyncope.     Current Outpatient Prescriptions  Medication Sig Dispense Refill  . albuterol (PROVENTIL HFA;VENTOLIN HFA) 108 (90 BASE) MCG/ACT inhaler Inhale 2 puffs into the lungs every 6 (six) hours as needed.      Marland Kitchen atenolol (TENORMIN) 25 MG tablet Take 0.5 tablets (12.5 mg total) by mouth daily.  45 tablet  3  . Calcium-Magnesium-Vitamin D (CALCIUM MAGNESIUM PO) Take by mouth daily.      . Cetirizine HCl 10 MG CAPS Take 1 capsule by mouth daily.        . Coenzyme Q10 (CO Q 10 PO) Take by mouth daily.      Marland Kitchen lidocaine (LIDODERM) 5 % Place 1 patch onto the skin daily. Remove & Discard patch within 12 hours or as directed by MD       . meloxicam (MOBIC) 7.5 MG tablet TAKE 1 TABLET BY MOUTH EVERY DAY WITH FOOD  90 tablet  0  . Multiple Vitamin (MULTIVITAMIN) capsule Take 1 capsule by mouth daily.      . mupirocin ointment (BACTROBAN) 2 % Apply topically as needed.      . NON FORMULARY ALJ (OTC for seasonal allergies)      . omeprazole (PRILOSEC) 20 MG capsule Take 1 capsule (20 mg total) by mouth daily.  90 capsule  PRN  . potassium chloride  (K-DUR,KLOR-CON) 10 MEQ tablet TAKE 1 TABLET BY MOUTH THREE TIMES DAILY  90 tablet  5  . potassium chloride (MICRO-K) 10 MEQ CR capsule Take 10 mEq by mouth 3 (three) times daily.        Marland Kitchen triamterene-hydrochlorothiazide (DYAZIDE) 37.5-25 MG per capsule Take 1 capsule by mouth every morning.      . triamterene-hydrochlorothiazide (MAXZIDE-25) 37.5-25 MG per tablet TAKE 1/2 TO 1 TABLET BY MOUTH EVERY DAY  90 tablet  3   No current facility-administered medications for this visit.    Allergies  Allergen Reactions  . Adhesive [Tape]   . Penicillins     REACTION: rash    History   Social History  . Marital Status: Married    Spouse Name: N/A    Number of Children: N/A  . Years of Education: N/A   Occupational History  . Not on file.   Social History Main Topics  . Smoking status: Never Smoker   . Smokeless tobacco: Not on file  . Alcohol Use: Not on file  . Drug Use: Not on file  . Sexual Activity: Not on file   Other Topics Concern  . Not on file   Social History Narrative  . No narrative on file     Review of Systems: General: negative for chills, fever, night sweats  or weight changes.  Cardiovascular: negative for chest pain, dyspnea on exertion, edema, orthopnea, palpitations, paroxysmal nocturnal dyspnea or shortness of breath Dermatological: negative for rash Respiratory: negative for cough or wheezing Urologic: negative for hematuria Abdominal: negative for nausea, vomiting, diarrhea, bright red blood per rectum, melena, or hematemesis Neurologic: negative for visual changes, syncope, or dizziness All other systems reviewed and are otherwise negative except as noted above.    Blood pressure 126/60, pulse 49, height 5\' 7"  (1.702 m), weight 151 lb (68.493 kg).  General appearance: alert and no distress Neck: no adenopathy, no carotid bruit, no JVD, supple, symmetrical, trachea midline and thyroid not enlarged, symmetric, no tenderness/mass/nodules Lungs: clear  to auscultation bilaterally Heart: regular rate and rhythm, S1, S2 normal, no murmur, click, rub or gallop Extremities: extremities normal, atraumatic, no cyanosis or edema  EKG sinus bradycardia at 49 without ST or T wave changes  ASSESSMENT AND PLAN:   Hypertension Under good control and her medications  PVC's (premature ventricular contractions) No longer symptomatic on beta blocker      Lorretta Harp MD [facility], [facility] 06/29/2014 3:57 PM

## 2014-08-19 ENCOUNTER — Other Ambulatory Visit: Payer: Self-pay

## 2014-08-19 MED ORDER — TRIAMTERENE-HCTZ 37.5-25 MG PO TABS: ORAL_TABLET | ORAL | Status: DC

## 2014-08-23 DIAGNOSIS — Z23 Encounter for immunization: Secondary | ICD-10-CM | POA: Diagnosis not present

## 2014-09-27 DIAGNOSIS — M179 Osteoarthritis of knee, unspecified: Secondary | ICD-10-CM | POA: Diagnosis not present

## 2014-09-27 DIAGNOSIS — M19041 Primary osteoarthritis, right hand: Secondary | ICD-10-CM | POA: Diagnosis not present

## 2014-09-27 DIAGNOSIS — M797 Fibromyalgia: Secondary | ICD-10-CM | POA: Diagnosis not present

## 2014-09-27 DIAGNOSIS — Z87898 Personal history of other specified conditions: Secondary | ICD-10-CM | POA: Diagnosis not present

## 2014-10-04 ENCOUNTER — Encounter: Payer: Self-pay | Admitting: Internal Medicine

## 2014-10-04 ENCOUNTER — Ambulatory Visit (INDEPENDENT_AMBULATORY_CARE_PROVIDER_SITE_OTHER): Payer: Medicare Other | Admitting: Internal Medicine

## 2014-10-04 VITALS — BP 132/72 | HR 52 | Temp 97.7°F | Ht 66.0 in | Wt 150.0 lb

## 2014-10-04 DIAGNOSIS — E119 Type 2 diabetes mellitus without complications: Secondary | ICD-10-CM

## 2014-10-04 DIAGNOSIS — I10 Essential (primary) hypertension: Secondary | ICD-10-CM

## 2014-10-04 DIAGNOSIS — E611 Iron deficiency: Secondary | ICD-10-CM | POA: Diagnosis not present

## 2014-10-04 DIAGNOSIS — Z0000 Encounter for general adult medical examination without abnormal findings: Secondary | ICD-10-CM

## 2014-10-04 DIAGNOSIS — E782 Mixed hyperlipidemia: Secondary | ICD-10-CM | POA: Diagnosis not present

## 2014-10-04 DIAGNOSIS — D509 Iron deficiency anemia, unspecified: Secondary | ICD-10-CM | POA: Diagnosis not present

## 2014-10-04 DIAGNOSIS — R7309 Other abnormal glucose: Secondary | ICD-10-CM | POA: Diagnosis not present

## 2014-10-04 DIAGNOSIS — R413 Other amnesia: Secondary | ICD-10-CM | POA: Diagnosis not present

## 2014-10-04 DIAGNOSIS — Z23 Encounter for immunization: Secondary | ICD-10-CM | POA: Diagnosis not present

## 2014-10-04 LAB — POCT URINALYSIS DIPSTICK
Bilirubin, UA: NEGATIVE
GLUCOSE UA: NEGATIVE
KETONES UA: NEGATIVE
Leukocytes, UA: NEGATIVE
Nitrite, UA: NEGATIVE
Protein, UA: NEGATIVE
Spec Grav, UA: 1.01
Urobilinogen, UA: NEGATIVE
pH, UA: 6

## 2014-10-04 MED ORDER — PNEUMOCOCCAL 13-VAL CONJ VACC IM SUSP
0.5000 mL | Freq: Once | INTRAMUSCULAR | Status: AC
  Administered 2014-10-04: 0.5 mL via INTRAMUSCULAR

## 2014-10-04 MED ORDER — PNEUMOCOCCAL 13-VAL CONJ VACC IM SUSP: 0.5000 mL | INTRAMUSCULAR | Status: DC

## 2014-10-04 NOTE — Progress Notes (Signed)
Subjective:    Patient ID: Donna Woods, female    DOB: July 17, 1934, 80 y.o.   MRN: [id number]  HPI 80 year old white female in today for health maintenance exam and evaluation of medical problems. She has a history of sinus bradycardia, essential hypertension, PVCs index to hyperlipidemia. Followed by Dr. Gwenlyn Found, cardiologist. Denies chest pain or shortness of breath. Had stress test 2010 which was nonischemic. Last saw Dr. Gwenlyn Found August 2015. PVCs controlled on beta blocker. Repeat appointment with Dr. Gwenlyn Found scheduled in one year.  Patient was hospitalized in December 2010 with a meningitis type process. He presented with a headache. 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 It was thought she had a viral syndrome. She was discharged from the hospital but condition worsened. She return to the hospital with increasing confusion, headache and meningismus. She was placed on antibiotics after which she improved. She was taken off antibiotics and worsened within 48 hours. She developed a left hemiparesis and confusion. MRI showed a proteinaceous exudate on the surface of the brain right side greater than left. She had an infectious disease consultation. Antibiotic coverage was continued. She slowly improved. Subsequently went to rehabilitation facility. Since then, she has had significant memory impairment and almost appears to have some early dementia. Short-term memory remains a problem. She has a gait disorder. She recurs assistance at times to ambulate. She tends to fall backward easily.  Past medical history: Knee replacement 2005, total hysterectomy approximate 1968, cholecystectomy [place] 1969. History of seasonal allergies, arthritis, palpitations.  Social history: Nonsmoker. No alcohol consumption. She is married. She is retired Radio producer. 2 adult daughters. Husband is very supportive.  Family history: Father died at age 53 of respiratory failure. Mother died at age 15  of neurological disease. One brother age 67 with a neurological disorder (CMT disease). Another brother in good health. A sister in good health. One daughter with hypertension and impaired glucose tolerance.  Patient also has GE reflux, osteoporosis, fibromyalgia type pain. History of vitamin D deficiency. History of chronic venous stasis and stasis dermatitis. History of hypertension and left inguinal hernia.      Review of Systems  Constitutional: Positive for fatigue.  Respiratory: Negative.   Cardiovascular:       See history of present illness  Gastrointestinal:       History of GE reflux  Neurological:       See history of present illness  Hematological: Negative.   Psychiatric/Behavioral: Negative for behavioral problems.       Objective:   Physical Exam  Constitutional: She is oriented to person, place, and time. She appears well-developed and well-nourished. No distress.  HENT:  Head: Normocephalic and atraumatic.  Left Ear: External ear normal.  Mouth/Throat: Oropharynx is clear and moist. No oropharyngeal exudate.  Eyes: Conjunctivae and EOM are normal. Pupils are equal, round, and reactive to light. No scleral icterus.  Neck: Neck supple. No JVD present. No thyromegaly present.  Cardiovascular: Normal rate, regular rhythm, normal heart sounds and intact distal pulses.   No murmur heard. Pulmonary/Chest: Effort normal and breath sounds normal.  Breasts normal female  Abdominal: Soft. Bowel sounds are normal. She exhibits no distension and no mass. There is no rebound and no guarding.  Genitourinary:  Deferred status post hysterectomy  Musculoskeletal: Normal range of motion. She exhibits no edema.  Lymphadenopathy:    She has no cervical adenopathy.  Neurological: She is alert and oriented to person, place, and time. She has normal reflexes. She displays normal  reflexes. No cranial nerve deficit. Coordination normal.  Skin: Skin is warm and dry. She is not  diaphoretic.  Psychiatric: She has a normal mood and affect. Her behavior is normal. Judgment and thought content normal.  Vitals reviewed.         Assessment & Plan:  History of meningitis type process 2010 with resultant short-term memory loss. She is alert and oriented 3 today and does very well including remembering 3 items after 5 minutes. Continue to monitor  GE reflux  Osteoporosis  History of vitamin D deficiency-these take vitamin D supplementation  Hypertension-stable and followed by cardiologist  History of PVCs-stable with beta blocker and followed by cardiologist   history of chronic venous stasis and stasis dermatitis  History of allergic rhinitis  Controlled type 2 diabetes mellitus. Hemoglobin A1c is increased from 5.7% 6.1%. Recommend diet and exercise.  Hyperlipidemia-mixed  Plan: Continue current medications return in one year or as needed. Recommend annual mammogram. Annual flu vaccine.      Subjective:   Patient presents for Medicare Annual/Subsequent preventive examination.  Review Past Medical/Family/Social: See above  Risk Factors  Current exercise habits: Sedentary Dietary issues discussed: Low fat-low carb. Diabetic diet  Cardiac risk factors: Hyperlipidemia  Depression Screen  (Note: if answer to either of the following is "Yes", a more complete depression screening is indicated)   Over the past two weeks, have you felt down, depressed or hopeless? No  Over the past two weeks, have you felt little interest or pleasure in doing things? No Have you lost interest or pleasure in daily life? No Do you often feel hopeless? No Do you cry easily over simple problems? No   Activities of Daily Living  In your present state of health, do you have any difficulty performing the following activities?:   Driving? No  Managing money? No  Feeding yourself? No  Getting from bed to chair? No  Climbing a flight of stairs? No  Preparing food and  eating?: No  Bathing or showering? No  Getting dressed: No  Getting to the toilet? No  Using the toilet:No  Moving around from place to place: No  In the past year have you fallen or had a near fall?:No  Are you sexually active? No  Do you have more than one partner? No   Hearing Difficulties: No  Do you often ask people to speak up or repeat themselves? No  Do you experience ringing or noises in your ears? Yes-history of tinnitus Do you have difficulty understanding soft or whispered voices? No  Do you feel that you have a problem with memory? yes Do you often misplace items? sometimes   Home Safety:  Do you have a smoke alarm at your residence? Yes Do you have grab bars in the bathroom? yes Do you have throw rugs in your house? yes   Cognitive Testing  Alert? Yes Normal Appearance?Yes  Oriented to person? Yes Place? Yes  Time? Yes  Recall of three objects? no Can perform simple calculations? Yes  Displays appropriate judgment?Yes  Can read the correct time from a watch face?Yes   List the Names of Other Physician/Practitioners you currently use:  See referral list for the physicians patient is currently seeing.   Cardiologist  Ophthalmologist  Review of Systems: See above   Objective:     General appearance: Appears stated age  Head: Normocephalic, without obvious abnormality, atraumatic  Eyes: conj clear, EOMi PEERLA  Ears: normal TM's and external ear canals both  ears  Nose: Nares normal. Septum midline. Mucosa normal. No drainage or sinus tenderness.  Throat: lips, mucosa, and tongue normal; teeth and gums normal  Neck: no adenopathy, no carotid bruit, no JVD, supple, symmetrical, trachea midline and thyroid not enlarged, symmetric, no tenderness/mass/nodules  No CVA tenderness.  Lungs: clear to auscultation bilaterally  Breasts: normal appearance, no masses or tenderness Heart: regular rate and rhythm, S1, S2 normal, no murmur, click, rub or gallop    Abdomen: soft, non-tender; bowel sounds normal; no masses, no organomegaly  Musculoskeletal: ROM normal in all joints, no crepitus, no deformity, Normal muscle strengthen. Back  is symmetric, no curvature. Skin: Skin color, texture, turgor normal. No rashes or lesions  Lymph nodes: Cervical, supraclavicular, and axillary nodes normal.  Neurologic: CN 2 -12 Normal, Normal symmetric reflexes. Normal coordination and gait  Psych: Alert & Oriented x 3, Mood appear stable.    Assessment:    Annual wellness medicare exam   Plan:    During the course of the visit the patient was educated and counseled about appropriate screening and preventive services including:   Mammogram  Annual flu vaccine   Patient Instructions (the written plan) was given to the patient.  Medicare Attestation  I have personally reviewed:  The patient's medical and social history  Their use of alcohol, tobacco or illicit drugs  Their current medications and supplements  The patient's functional ability including ADLs,fall risks, home safety risks, cognitive, and hearing and visual impairment  Diet and physical activities  Evidence for depression or mood disorders  The patient's weight, height, BMI, and visual acuity have been recorded in the chart. I have made referrals, counseling, and provided education to the patient based on review of the above and I have provided the patient with a written personalized care plan for preventive services.

## 2014-10-04 NOTE — Patient Instructions (Addendum)
Return in one year or as needed. Have mammogram.

## 2014-10-05 ENCOUNTER — Other Ambulatory Visit: Payer: Self-pay | Admitting: Internal Medicine

## 2014-10-05 DIAGNOSIS — Z1231 Encounter for screening mammogram for malignant neoplasm of breast: Secondary | ICD-10-CM

## 2014-10-05 LAB — COMPREHENSIVE METABOLIC PANEL
ALT: 9 U/L (ref 0–35)
AST: 8 U/L (ref 0–37)
Albumin: 4.6 g/dL (ref 3.5–5.2)
Alkaline Phosphatase: 51 U/L (ref 39–117)
BUN: 28 mg/dL — AB (ref 6–23)
CALCIUM: 10.1 mg/dL (ref 8.4–10.5)
CO2: 24 meq/L (ref 19–32)
CREATININE: 0.98 mg/dL (ref 0.50–1.10)
Chloride: 103 mEq/L (ref 96–112)
GLUCOSE: 90 mg/dL (ref 70–99)
Potassium: 4.1 mEq/L (ref 3.5–5.3)
Sodium: 140 mEq/L (ref 135–145)
Total Bilirubin: 0.6 mg/dL (ref 0.2–1.2)
Total Protein: 6.4 g/dL (ref 6.0–8.3)

## 2014-10-05 LAB — CBC WITH DIFFERENTIAL/PLATELET
Basophils Absolute: 0.1 10*3/uL (ref 0.0–0.1)
Basophils Relative: 1 % (ref 0–1)
EOS PCT: 1 % (ref 0–5)
Eosinophils Absolute: 0.1 10*3/uL (ref 0.0–0.7)
HEMATOCRIT: 40.4 % (ref 36.0–46.0)
Hemoglobin: 13.6 g/dL (ref 12.0–15.0)
LYMPHS ABS: 1.7 10*3/uL (ref 0.7–4.0)
Lymphocytes Relative: 21 % (ref 12–46)
MCH: 31.8 pg (ref 26.0–34.0)
MCHC: 33.7 g/dL (ref 30.0–36.0)
MCV: 94.4 fL (ref 78.0–100.0)
MONO ABS: 0.7 10*3/uL (ref 0.1–1.0)
Monocytes Relative: 8 % (ref 3–12)
Neutro Abs: 5.7 10*3/uL (ref 1.7–7.7)
Neutrophils Relative %: 69 % (ref 43–77)
Platelets: 200 10*3/uL (ref 150–400)
RBC: 4.28 MIL/uL (ref 3.87–5.11)
RDW: 13.6 % (ref 11.5–15.5)
WBC: 8.3 10*3/uL (ref 4.0–10.5)

## 2014-10-05 LAB — LIPID PANEL
CHOL/HDL RATIO: 2.9 ratio
CHOLESTEROL: 170 mg/dL (ref 0–200)
HDL: 58 mg/dL (ref >39)
LDL Cholesterol: 90 mg/dL (ref 0–99)
Triglycerides: 111 mg/dL (ref <150)
VLDL: 22 mg/dL (ref 0–40)

## 2014-10-05 LAB — TSH: TSH: 2.762 u[IU]/mL (ref 0.350–4.500)

## 2014-10-05 LAB — HEMOGLOBIN A1C
HEMOGLOBIN A1C: 6.1 % — AB (ref <5.7)
Mean Plasma Glucose: 128 mg/dL — ABNORMAL HIGH (ref <117)

## 2014-12-01 DIAGNOSIS — M1712 Unilateral primary osteoarthritis, left knee: Secondary | ICD-10-CM | POA: Diagnosis not present

## 2014-12-08 ENCOUNTER — Ambulatory Visit
Admission: RE | Admit: 2014-12-08 | Discharge: 2014-12-08 | Disposition: A | Discharge status: Home / Self Care | Payer: Medicare Other | Source: Ambulatory Visit | Attending: Internal Medicine | Admitting: Internal Medicine

## 2014-12-08 DIAGNOSIS — Z1231 Encounter for screening mammogram for malignant neoplasm of breast: Secondary | ICD-10-CM

## 2014-12-08 DIAGNOSIS — M1712 Unilateral primary osteoarthritis, left knee: Secondary | ICD-10-CM | POA: Diagnosis not present

## 2014-12-15 DIAGNOSIS — M1712 Unilateral primary osteoarthritis, left knee: Secondary | ICD-10-CM | POA: Diagnosis not present

## 2014-12-22 DIAGNOSIS — M1712 Unilateral primary osteoarthritis, left knee: Secondary | ICD-10-CM | POA: Diagnosis not present

## 2014-12-29 DIAGNOSIS — M170 Bilateral primary osteoarthritis of knee: Secondary | ICD-10-CM | POA: Diagnosis not present

## 2015-01-16 DIAGNOSIS — R413 Other amnesia: Secondary | ICD-10-CM | POA: Insufficient documentation

## 2015-01-16 DIAGNOSIS — E119 Type 2 diabetes mellitus without complications: Secondary | ICD-10-CM | POA: Insufficient documentation

## 2015-02-22 DIAGNOSIS — H2510 Age-related nuclear cataract, unspecified eye: Secondary | ICD-10-CM | POA: Diagnosis not present

## 2015-02-22 DIAGNOSIS — H25013 Cortical age-related cataract, bilateral: Secondary | ICD-10-CM | POA: Diagnosis not present

## 2015-02-22 DIAGNOSIS — H52221 Regular astigmatism, right eye: Secondary | ICD-10-CM | POA: Diagnosis not present

## 2015-02-22 DIAGNOSIS — H25043 Posterior subcapsular polar age-related cataract, bilateral: Secondary | ICD-10-CM | POA: Diagnosis not present

## 2015-02-22 DIAGNOSIS — H11051 Peripheral pterygium, progressive, right eye: Secondary | ICD-10-CM | POA: Diagnosis not present

## 2015-03-02 DIAGNOSIS — Z79899 Other long term (current) drug therapy: Secondary | ICD-10-CM | POA: Diagnosis not present

## 2015-03-28 DIAGNOSIS — M170 Bilateral primary osteoarthritis of knee: Secondary | ICD-10-CM | POA: Diagnosis not present

## 2015-03-28 DIAGNOSIS — M8580 Other specified disorders of bone density and structure, unspecified site: Secondary | ICD-10-CM | POA: Diagnosis not present

## 2015-03-28 DIAGNOSIS — M5137 Other intervertebral disc degeneration, lumbosacral region: Secondary | ICD-10-CM | POA: Diagnosis not present

## 2015-03-28 DIAGNOSIS — M19041 Primary osteoarthritis, right hand: Secondary | ICD-10-CM | POA: Diagnosis not present

## 2015-04-28 ENCOUNTER — Telehealth: Payer: Self-pay | Admitting: Cardiovascular Disease

## 2015-04-28 MED ORDER — ATENOLOL 25 MG PO TABS: 12.5000 mg | ORAL_TABLET | Freq: Every day | ORAL | Status: DC

## 2015-04-28 NOTE — Telephone Encounter (Signed)
Refill submitted to patient's preferred pharmacy. Informed patient. Pt voiced understanding, no other stated concerns at this time.  

## 2015-04-28 NOTE — Telephone Encounter (Signed)
°  1. Which medications need to be refilled? Atnenolol  2. Which pharmacy is medication to be sent to?[facility] in [place]   3. Do they need a 30 day or 90 day supply? 30   4. Would they like a call back once the medication has been sent to the pharmacy? yes

## 2015-04-29 DIAGNOSIS — Z780 Asymptomatic menopausal state: Secondary | ICD-10-CM | POA: Diagnosis not present

## 2015-04-29 DIAGNOSIS — M810 Age-related osteoporosis without current pathological fracture: Secondary | ICD-10-CM | POA: Diagnosis not present

## 2015-04-29 DIAGNOSIS — Z1231 Encounter for screening mammogram for malignant neoplasm of breast: Secondary | ICD-10-CM | POA: Diagnosis not present

## 2015-05-17 ENCOUNTER — Encounter: Payer: Self-pay | Admitting: Internal Medicine

## 2015-05-23 DIAGNOSIS — M2041 Other hammer toe(s) (acquired), right foot: Secondary | ICD-10-CM | POA: Diagnosis not present

## 2015-05-23 DIAGNOSIS — M2042 Other hammer toe(s) (acquired), left foot: Secondary | ICD-10-CM | POA: Diagnosis not present

## 2015-05-23 DIAGNOSIS — B351 Tinea unguium: Secondary | ICD-10-CM | POA: Diagnosis not present

## 2015-07-01 ENCOUNTER — Telehealth: Payer: Self-pay | Admitting: Cardiovascular Disease

## 2015-07-01 ENCOUNTER — Encounter: Payer: Self-pay | Admitting: Cardiovascular Disease

## 2015-07-01 ENCOUNTER — Ambulatory Visit (INDEPENDENT_AMBULATORY_CARE_PROVIDER_SITE_OTHER): Payer: Medicare Other | Admitting: Cardiovascular Disease

## 2015-07-01 VITALS — BP 130/66 | HR 43 | Ht 67.0 in | Wt 149.7 lb

## 2015-07-01 DIAGNOSIS — I10 Essential (primary) hypertension: Secondary | ICD-10-CM

## 2015-07-01 DIAGNOSIS — I493 Ventricular premature depolarization: Secondary | ICD-10-CM | POA: Diagnosis not present

## 2015-07-01 MED ORDER — ATENOLOL 25 MG PO TABS: 12.5000 mg | ORAL_TABLET | Freq: Every day | ORAL | Status: DC

## 2015-07-01 NOTE — Telephone Encounter (Signed)
Rx(s) sent to pharmacy electronically.  

## 2015-07-01 NOTE — Progress Notes (Signed)
07/01/2015 Donna Woods   December 05, 1933  [id number]  Primary Physician Elby Showers, MD Primary Cardiologist: Lorretta Harp MD [name]   HPI: : The patient is delightful, 81 year old, thin appearing, married, Caucasian female mother of 2, grandmother to 6 grandchildren who was formerly a patient of Dr. Janene Madeira remotely. I saw her one year ago. She has a history of palpitations related to asymptomatic PVCs, hypertension. She denies chest pain or shortness of breath. Her last stress test performed 5 years ago was nonischemic. She did have a poorly defined neurologic illness which affected her memory and spanned many months requiring prolonged hospitalization and antibiotic therapy which she has for the most part recovered from.saw her a year ago she is asymptomatic. She denies chest pain shortness of breath dizziness or presyncope.   Current Outpatient Prescriptions  Medication Sig Dispense Refill  . albuterol (PROVENTIL HFA;VENTOLIN HFA) 108 (90 BASE) MCG/ACT inhaler Inhale 2 puffs into the lungs every 6 (six) hours as needed.    Marland Kitchen atenolol (TENORMIN) 25 MG tablet Take 0.5 tablets (12.5 mg total) by mouth daily. 15 tablet 2  . Calcium-Magnesium-Vitamin D (CALCIUM MAGNESIUM PO) Take by mouth daily.    . Cetirizine HCl 10 MG CAPS Take 1 capsule by mouth daily.      . Coenzyme Q10 (CO Q 10 PO) Take by mouth daily.    Marland Kitchen FLUVIRIN SUSP   0  . lidocaine (LIDODERM) 5 % Place 1 patch onto the skin daily. Remove & Discard patch within 12 hours or as directed by MD     . Multiple Vitamin (MULTIVITAMIN) capsule Take 1 capsule by mouth daily.    . mupirocin ointment (BACTROBAN) 2 % Apply topically as needed.    . NON FORMULARY ALJ (OTC for seasonal allergies)    . omeprazole (PRILOSEC) 20 MG capsule Take 1 capsule (20 mg total) by mouth daily. 90 capsule PRN  . potassium chloride (MICRO-K) 10 MEQ CR capsule Take 10 mEq by mouth 2 (two) times daily.     Marland Kitchen  triamterene-hydrochlorothiazide (MAXZIDE-25) 37.5-25 MG per tablet TAKE 1/2 TO 1 TABLET BY MOUTH EVERY DAY 90 tablet 3   No current facility-administered medications for this visit.    Allergies  Allergen Reactions  . Adhesive [Tape]   . Benicar [Olmesartan]   . Penicillins     REACTION: rash    History   Social History  . Marital Status: Married    Spouse Name: N/A  . Number of Children: N/A  . Years of Education: N/A   Occupational History  . Not on file.   Social History Main Topics  . Smoking status: Never Smoker   . Smokeless tobacco: Not on file  . Alcohol Use: No  . Drug Use: No  . Sexual Activity: No   Other Topics Concern  . Not on file   Social History Narrative     Review of Systems: General: negative for chills, fever, night sweats or weight changes.  Cardiovascular: negative for chest pain, dyspnea on exertion, edema, orthopnea, palpitations, paroxysmal nocturnal dyspnea or shortness of breath Dermatological: negative for rash Respiratory: negative for cough or wheezing Urologic: negative for hematuria Abdominal: negative for nausea, vomiting, diarrhea, bright red blood per rectum, melena, or hematemesis Neurologic: negative for visual changes, syncope, or dizziness All other systems reviewed and are otherwise negative except as noted above.    Blood pressure 130/66, pulse 43, height 5\' 7"  (1.702 m), weight 149 lb 11.2 oz (67.903  kg).  General appearance: alert and no distress Neck: no adenopathy, no carotid bruit, no JVD, supple, symmetrical, trachea midline and thyroid not enlarged, symmetric, no tenderness/mass/nodules Lungs: clear to auscultation bilaterally Heart: regular rate and rhythm, S1, S2 normal, no murmur, click, rub or gallop Extremities: extremities normal, atraumatic, no cyanosis or edema  EKG sinus bradycardia at 43 with a ST or T-wave changes. I personally reviewed this EKG  ASSESSMENT AND PLAN:   PVC's (premature ventricular  contractions) History of chronic PVCs  Hypertension History of hypertension blood pressure measured at 130/66. She is on Maxzide and atenolol. Continue current meds at current dosing      Lorretta Harp MD [facility], [facility] 07/01/2015 2:20 PM

## 2015-07-01 NOTE — Assessment & Plan Note (Signed)
History of chronic PVCs

## 2015-07-01 NOTE — Patient Instructions (Signed)
Your physician wants you to follow-up in: 1 year with Dr Berry. You will receive a reminder letter in the mail two months in advance. If you don't receive a letter, please call our office to schedule the follow-up appointment.  

## 2015-07-01 NOTE — Telephone Encounter (Signed)
°  1. Which medications need to be refilled?Atenolol 25mg   2. Which pharmacy is medication to be sent to?[facility] in [place]   3. Do they need a 30 day or 90 day supply? 90  4. Would they like a call back once the medication has been sent to the pharmacy? no

## 2015-07-01 NOTE — Assessment & Plan Note (Signed)
History of hypertension blood pressure measured at 130/66. She is on Maxzide and atenolol. Continue current meds at current dosing

## 2015-07-09 ENCOUNTER — Other Ambulatory Visit: Payer: Self-pay | Admitting: Internal Medicine

## 2015-08-11 DIAGNOSIS — Z23 Encounter for immunization: Secondary | ICD-10-CM | POA: Diagnosis not present

## 2015-09-13 DIAGNOSIS — M1712 Unilateral primary osteoarthritis, left knee: Secondary | ICD-10-CM | POA: Diagnosis not present

## 2015-10-03 DIAGNOSIS — M7671 Peroneal tendinitis, right leg: Secondary | ICD-10-CM | POA: Diagnosis not present

## 2015-10-11 ENCOUNTER — Ambulatory Visit (INDEPENDENT_AMBULATORY_CARE_PROVIDER_SITE_OTHER): Payer: Medicare Other | Admitting: Internal Medicine

## 2015-10-11 ENCOUNTER — Telehealth: Payer: Self-pay | Admitting: Internal Medicine

## 2015-10-11 ENCOUNTER — Encounter: Payer: Self-pay | Admitting: Internal Medicine

## 2015-10-11 ENCOUNTER — Other Ambulatory Visit: Payer: Self-pay | Admitting: *Deleted

## 2015-10-11 VITALS — BP 140/68 | HR 44 | Temp 97.6°F | Resp 20 | Ht 67.0 in | Wt 149.0 lb

## 2015-10-11 DIAGNOSIS — D509 Iron deficiency anemia, unspecified: Secondary | ICD-10-CM | POA: Diagnosis not present

## 2015-10-11 DIAGNOSIS — E785 Hyperlipidemia, unspecified: Secondary | ICD-10-CM

## 2015-10-11 DIAGNOSIS — I10 Essential (primary) hypertension: Secondary | ICD-10-CM | POA: Diagnosis not present

## 2015-10-11 DIAGNOSIS — I872 Venous insufficiency (chronic) (peripheral): Secondary | ICD-10-CM

## 2015-10-11 DIAGNOSIS — M1711 Unilateral primary osteoarthritis, right knee: Secondary | ICD-10-CM | POA: Diagnosis not present

## 2015-10-11 DIAGNOSIS — K219 Gastro-esophageal reflux disease without esophagitis: Secondary | ICD-10-CM

## 2015-10-11 DIAGNOSIS — R7302 Impaired glucose tolerance (oral): Secondary | ICD-10-CM

## 2015-10-11 DIAGNOSIS — I493 Ventricular premature depolarization: Secondary | ICD-10-CM | POA: Diagnosis not present

## 2015-10-11 DIAGNOSIS — R001 Bradycardia, unspecified: Secondary | ICD-10-CM | POA: Diagnosis not present

## 2015-10-11 DIAGNOSIS — H9319 Tinnitus, unspecified ear: Secondary | ICD-10-CM | POA: Diagnosis not present

## 2015-10-11 DIAGNOSIS — R413 Other amnesia: Secondary | ICD-10-CM

## 2015-10-11 DIAGNOSIS — E119 Type 2 diabetes mellitus without complications: Secondary | ICD-10-CM

## 2015-10-11 DIAGNOSIS — I8310 Varicose veins of unspecified lower extremity with inflammation: Secondary | ICD-10-CM | POA: Diagnosis not present

## 2015-10-11 DIAGNOSIS — I878 Other specified disorders of veins: Secondary | ICD-10-CM

## 2015-10-11 DIAGNOSIS — M810 Age-related osteoporosis without current pathological fracture: Secondary | ICD-10-CM

## 2015-10-11 DIAGNOSIS — Z79899 Other long term (current) drug therapy: Secondary | ICD-10-CM | POA: Diagnosis not present

## 2015-10-11 DIAGNOSIS — Z0000 Encounter for general adult medical examination without abnormal findings: Secondary | ICD-10-CM

## 2015-10-11 DIAGNOSIS — E559 Vitamin D deficiency, unspecified: Secondary | ICD-10-CM

## 2015-10-11 LAB — CBC WITH DIFFERENTIAL/PLATELET
Basophils Absolute: 0.1 10*3/uL (ref 0.0–0.1)
Basophils Relative: 1 % (ref 0–1)
EOS PCT: 2 % (ref 0–5)
Eosinophils Absolute: 0.1 10*3/uL (ref 0.0–0.7)
HEMATOCRIT: 39.7 % (ref 36.0–46.0)
Hemoglobin: 13.7 g/dL (ref 12.0–15.0)
LYMPHS PCT: 26 % (ref 12–46)
Lymphs Abs: 1.8 10*3/uL (ref 0.7–4.0)
MCH: 32.9 pg (ref 26.0–34.0)
MCHC: 34.5 g/dL (ref 30.0–36.0)
MCV: 95.2 fL (ref 78.0–100.0)
MONO ABS: 0.6 10*3/uL (ref 0.1–1.0)
MPV: 11.2 fL (ref 8.6–12.4)
Monocytes Relative: 9 % (ref 3–12)
Neutro Abs: 4.3 10*3/uL (ref 1.7–7.7)
Neutrophils Relative %: 62 % (ref 43–77)
Platelets: 209 10*3/uL (ref 150–400)
RBC: 4.17 MIL/uL (ref 3.87–5.11)
RDW: 14.1 % (ref 11.5–15.5)
WBC: 7 10*3/uL (ref 4.0–10.5)

## 2015-10-11 LAB — COMPLETE METABOLIC PANEL WITH GFR
ALK PHOS: 49 U/L (ref 33–130)
ALT: 12 U/L (ref 6–29)
AST: 9 U/L — ABNORMAL LOW (ref 10–35)
Albumin: 4.5 g/dL (ref 3.6–5.1)
BILIRUBIN TOTAL: 0.9 mg/dL (ref 0.2–1.2)
BUN: 25 mg/dL (ref 7–25)
CO2: 26 mmol/L (ref 20–31)
CREATININE: 1.03 mg/dL — AB (ref 0.60–0.93)
Calcium: 9.8 mg/dL (ref 8.6–10.4)
Chloride: 103 mmol/L (ref 98–110)
GFR, EST NON AFRICAN AMERICAN: 52 mL/min — AB (ref >=60)
GFR, Est African American: 60 mL/min (ref >=60)
GLUCOSE: 97 mg/dL (ref 65–99)
Potassium: 4.2 mmol/L (ref 3.5–5.3)
Sodium: 141 mmol/L (ref 135–146)
Total Protein: 6.7 g/dL (ref 6.1–8.1)

## 2015-10-11 LAB — LIPID PANEL
CHOL/HDL RATIO: 3.1 ratio (ref <=5.0)
Cholesterol: 171 mg/dL (ref 125–200)
HDL: 56 mg/dL (ref >=46)
LDL CALC: 92 mg/dL (ref <130)
Triglycerides: 116 mg/dL (ref <150)
VLDL: 23 mg/dL (ref <30)

## 2015-10-11 LAB — POCT URINALYSIS DIPSTICK
Blood, UA: NEGATIVE
GLUCOSE UA: NEGATIVE
Ketones, UA: NEGATIVE
LEUKOCYTES UA: NEGATIVE
NITRITE UA: NEGATIVE
PH UA: 6
Protein, UA: NEGATIVE
Spec Grav, UA: 1.02
UROBILINOGEN UA: 0.2

## 2015-10-11 LAB — HEMOGLOBIN A1C
Hgb A1c MFr Bld: 5.8 % — ABNORMAL HIGH (ref <5.7)
Mean Plasma Glucose: 120 mg/dL — ABNORMAL HIGH (ref <117)

## 2015-10-11 LAB — TSH: TSH: 2.485 u[IU]/mL (ref 0.350–4.500)

## 2015-10-11 NOTE — Telephone Encounter (Signed)
Appointment made per Dr. Renold Genta for patient to be seen for hip replacement surgery as well as bradycardia with Dr. Gwenlyn Found.   Friday, 11/18 @ 10:45  24 Holter Monitor prior to that appointment 11/16 @ 12 Noon ([place])  Appointment dates/times and addresses given to patient's husband and daughter, Leigh Aurora.

## 2015-10-12 ENCOUNTER — Ambulatory Visit (INDEPENDENT_AMBULATORY_CARE_PROVIDER_SITE_OTHER): Payer: Medicare Other

## 2015-10-12 DIAGNOSIS — R001 Bradycardia, unspecified: Secondary | ICD-10-CM | POA: Diagnosis not present

## 2015-10-12 LAB — VITAMIN D 25 HYDROXY (VIT D DEFICIENCY, FRACTURES): VIT D 25 HYDROXY: 50 ng/mL (ref 30–100)

## 2015-10-12 LAB — MICROALBUMIN / CREATININE URINE RATIO
CREATININE, URINE: 130 mg/dL (ref 20–320)
MICROALB UR: 2 mg/dL
Microalb Creat Ratio: 15 mcg/mg creat (ref <30)

## 2015-10-14 ENCOUNTER — Encounter: Payer: Self-pay | Admitting: Cardiovascular Disease

## 2015-10-14 ENCOUNTER — Ambulatory Visit (INDEPENDENT_AMBULATORY_CARE_PROVIDER_SITE_OTHER): Payer: Medicare Other | Admitting: Cardiovascular Disease

## 2015-10-14 VITALS — BP 138/64 | HR 52 | Ht 66.0 in | Wt 148.0 lb

## 2015-10-14 DIAGNOSIS — I10 Essential (primary) hypertension: Secondary | ICD-10-CM

## 2015-10-14 DIAGNOSIS — I493 Ventricular premature depolarization: Secondary | ICD-10-CM

## 2015-10-14 DIAGNOSIS — R001 Bradycardia, unspecified: Secondary | ICD-10-CM

## 2015-10-14 DIAGNOSIS — E782 Mixed hyperlipidemia: Secondary | ICD-10-CM

## 2015-10-14 DIAGNOSIS — Z01818 Encounter for other preprocedural examination: Secondary | ICD-10-CM | POA: Diagnosis not present

## 2015-10-14 NOTE — Assessment & Plan Note (Signed)
History of a symptomatically bradycardia probably contributed to by her beta blockade

## 2015-10-14 NOTE — Patient Instructions (Signed)
Medication Instructions:  Your physician recommends that you continue on your current medications as directed. Please refer to the Current Medication list given to you today.   Labwork: None  Testing/Procedures: Your physician has requested that you have a lexiscan myoview. For further information please visit [email or web address]. Please follow instruction sheet, as given.    Follow-Up: Your physician wants you to follow-up in: 12 months with DR. Gwenlyn Found. You will receive a reminder letter in the mail two months in advance. If you don't receive a letter, please call our office to schedule the follow-up appointment.   Any Other Special Instructions Will Be Listed Below (If Applicable).     If you need a refill on your cardiac medications before your next appointment, please call your pharmacy.

## 2015-10-14 NOTE — Assessment & Plan Note (Signed)
History of hypertension blood pressure measured today at 138/64 on low-dose atenolol and Maxzide. Continue current meds at current dosing

## 2015-10-14 NOTE — Assessment & Plan Note (Signed)
History of PVCs on low-dose beta blocker

## 2015-10-14 NOTE — Progress Notes (Signed)
10/14/2015 Donna Woods   1934/08/03  [id number]  Primary Physician Elby Showers, MD Primary Cardiologist: Lorretta Harp MD [name]   HPI:   The patient is delightful, 81 year old, thin appearing, married, Caucasian female mother of 2, grandmother to 6 grandchildren who was formerly a patient of Dr. Janene Madeira remotely. I saw her 07/01/15.  She has a history of palpitations related to asymptomatic PVCs, hypertension. She denies chest pain or shortness of breath. Her last stress test performed 5 years ago was nonischemic. She did have a poorly defined neurologic illness which affected her memory and spanned many months requiring prolonged hospitalization and antibiotic therapy which she has for the most part recovered from.saw her a year ago she is asymptomatic. She denies chest pain shortness of breath dizziness or presyncope.she was referred back for preoperative clearance before elective left total knee replacement by Dr. Durward Fortes.   Current Outpatient Prescriptions  Medication Sig Dispense Refill  . albuterol (PROVENTIL HFA;VENTOLIN HFA) 108 (90 BASE) MCG/ACT inhaler Inhale 2 puffs into the lungs every 6 (six) hours as needed.    Marland Kitchen atenolol (TENORMIN) 25 MG tablet Take 0.5 tablets (12.5 mg total) by mouth daily. 45 tablet 3  . Calcium-Magnesium-Vitamin D (CALCIUM MAGNESIUM PO) Take by mouth daily.    . Cetirizine HCl 10 MG CAPS Take 1 capsule by mouth daily.      . Coenzyme Q10 (CO Q 10 PO) Take by mouth daily.    Marland Kitchen FLUVIRIN SUSP   0  . lidocaine (LIDODERM) 5 % Place 1 patch onto the skin daily. Remove & Discard patch within 12 hours or as directed by MD     . Multiple Vitamin (MULTIVITAMIN) capsule Take 1 capsule by mouth daily.    . mupirocin ointment (BACTROBAN) 2 % Apply topically as needed.    . NON FORMULARY ALJ (OTC for seasonal allergies)    . omeprazole (PRILOSEC) 20 MG capsule TAKE ONE CAPSULE BY MOUTH ONCE DAILY 90 capsule 3  . potassium chloride  (MICRO-K) 10 MEQ CR capsule Take 10 mEq by mouth 2 (two) times daily.     Marland Kitchen triamterene-hydrochlorothiazide (MAXZIDE-25) 37.5-25 MG per tablet TAKE 1/2 TO 1 TABLET BY MOUTH EVERY DAY 90 tablet 3   No current facility-administered medications for this visit.    Allergies  Allergen Reactions  . Adhesive [Tape]   . Benicar [Olmesartan]   . Penicillins     REACTION: rash    Social History   Social History  . Marital Status: Married    Spouse Name: N/A  . Number of Children: N/A  . Years of Education: N/A   Occupational History  . Not on file.   Social History Main Topics  . Smoking status: Never Smoker   . Smokeless tobacco: Not on file  . Alcohol Use: No  . Drug Use: No  . Sexual Activity: No   Other Topics Concern  . Not on file   Social History Narrative     Review of Systems: General: negative for chills, fever, night sweats or weight changes.  Cardiovascular: negative for chest pain, dyspnea on exertion, edema, orthopnea, palpitations, paroxysmal nocturnal dyspnea or shortness of breath Dermatological: negative for rash Respiratory: negative for cough or wheezing Urologic: negative for hematuria Abdominal: negative for nausea, vomiting, diarrhea, bright red blood per rectum, melena, or hematemesis Neurologic: negative for visual changes, syncope, or dizziness All other systems reviewed and are otherwise negative except as noted above.    Blood pressure  138/64, pulse 52, height 5\' 6"  (1.676 m), weight 148 lb (67.132 kg).  General appearance: alert and no distress Neck: no adenopathy, no carotid bruit, no JVD, supple, symmetrical, trachea midline and thyroid not enlarged, symmetric, no tenderness/mass/nodules Lungs: clear to auscultation bilaterally Heart: regular rate and rhythm, S1, S2 normal, no murmur, click, rub or gallop Extremities: extremities normal, atraumatic, no cyanosis or edema  EKG not performed today  ASSESSMENT AND PLAN:   PVC's (premature  ventricular contractions) History of PVCs on low-dose beta blocker  Hypertension History of hypertension blood pressure measured today at 138/64 on low-dose atenolol and Maxzide. Continue current meds at current dosing  Bradycardia History of a symptomatically bradycardia probably contributed to by her beta blockade      Lorretta Harp MD [facility], [facility] 10/14/2015 11:09 AM

## 2015-10-18 ENCOUNTER — Telehealth (HOSPITAL_COMMUNITY): Payer: Self-pay

## 2015-10-18 NOTE — Telephone Encounter (Signed)
Encounter completed.

## 2015-10-19 ENCOUNTER — Ambulatory Visit (HOSPITAL_COMMUNITY)
Admission: RE | Admit: 2015-10-19 | Discharge: 2015-10-19 | Disposition: A | Discharge status: Home / Self Care | Payer: Medicare Other | Source: Ambulatory Visit | Attending: Cardiovascular Disease | Admitting: Cardiovascular Disease

## 2015-10-19 DIAGNOSIS — Z01810 Encounter for preprocedural cardiovascular examination: Secondary | ICD-10-CM | POA: Diagnosis not present

## 2015-10-19 DIAGNOSIS — I10 Essential (primary) hypertension: Secondary | ICD-10-CM

## 2015-10-19 DIAGNOSIS — Z01818 Encounter for other preprocedural examination: Secondary | ICD-10-CM

## 2015-10-19 DIAGNOSIS — E782 Mixed hyperlipidemia: Secondary | ICD-10-CM | POA: Diagnosis not present

## 2015-10-19 LAB — MYOCARDIAL PERFUSION IMAGING
CHL CUP NUCLEAR SDS: 1
CHL CUP NUCLEAR SSS: 2
LV dias vol: 78 mL
LV sys vol: 33 mL
Peak HR: 67 {beats}/min
Rest HR: 49 {beats}/min
SRS: 1
TID: 0.95

## 2015-10-19 MED ORDER — AMINOPHYLLINE 25 MG/ML IV SOLN
75.0000 mg | Freq: Once | INTRAVENOUS | Status: AC
  Administered 2015-10-19: 75 mg via INTRAVENOUS

## 2015-10-19 MED ORDER — REGADENOSON 0.4 MG/5ML IV SOLN
0.4000 mg | Freq: Once | INTRAVENOUS | Status: AC
  Administered 2015-10-19: 0.4 mg via INTRAVENOUS

## 2015-10-19 MED ORDER — TECHNETIUM TC 99M SESTAMIBI GENERIC - CARDIOLITE
32.0000 | Freq: Once | INTRAVENOUS | Status: AC | PRN
  Administered 2015-10-19: 32 via INTRAVENOUS

## 2015-10-19 MED ORDER — TECHNETIUM TC 99M SESTAMIBI GENERIC - CARDIOLITE
10.2000 | Freq: Once | INTRAVENOUS | Status: AC | PRN
  Administered 2015-10-19: 10.2 via INTRAVENOUS

## 2015-11-01 ENCOUNTER — Telehealth: Payer: Self-pay

## 2015-11-01 DIAGNOSIS — M1712 Unilateral primary osteoarthritis, left knee: Secondary | ICD-10-CM | POA: Diagnosis not present

## 2015-11-01 NOTE — Telephone Encounter (Signed)
Low risk Myoview. Cleared for surgery at low risk

## 2015-11-01 NOTE — Telephone Encounter (Signed)
Requesting surgical clearance:   1. Type of surgery: Left total knee replacment  2. Surgeon: Dr. Margaree Mackintosh  3. Surgical date: pending clearance  4. Medications that need to be held: no  5. CAD: No     6. I will defer to: Dr. [facility] [phone] Phone [phone] Fax

## 2015-11-02 NOTE — Telephone Encounter (Signed)
Pt clearance send to [facility].

## 2015-11-22 ENCOUNTER — Encounter (HOSPITAL_COMMUNITY)
Admission: RE | Admit: 2015-11-22 | Discharge: 2015-11-22 | Disposition: A | Discharge status: Home / Self Care | Payer: Medicare Other | Source: Ambulatory Visit | Attending: Orthopaedic Surgery | Admitting: Orthopaedic Surgery

## 2015-11-22 ENCOUNTER — Ambulatory Visit (HOSPITAL_COMMUNITY)
Admission: RE | Admit: 2015-11-22 | Discharge: 2015-11-22 | Disposition: A | Discharge status: Home / Self Care | Payer: Medicare Other | Source: Ambulatory Visit | Attending: Orthopedic Surgery | Admitting: Orthopedic Surgery

## 2015-11-22 ENCOUNTER — Encounter (HOSPITAL_COMMUNITY): Payer: Self-pay

## 2015-11-22 DIAGNOSIS — F0390 Unspecified dementia without behavioral disturbance: Secondary | ICD-10-CM | POA: Insufficient documentation

## 2015-11-22 DIAGNOSIS — Z79899 Other long term (current) drug therapy: Secondary | ICD-10-CM | POA: Diagnosis not present

## 2015-11-22 DIAGNOSIS — Z7901 Long term (current) use of anticoagulants: Secondary | ICD-10-CM | POA: Diagnosis not present

## 2015-11-22 DIAGNOSIS — M797 Fibromyalgia: Secondary | ICD-10-CM | POA: Diagnosis not present

## 2015-11-22 DIAGNOSIS — M810 Age-related osteoporosis without current pathological fracture: Secondary | ICD-10-CM | POA: Insufficient documentation

## 2015-11-22 DIAGNOSIS — Z01812 Encounter for preprocedural laboratory examination: Secondary | ICD-10-CM | POA: Diagnosis not present

## 2015-11-22 DIAGNOSIS — Z01818 Encounter for other preprocedural examination: Secondary | ICD-10-CM | POA: Insufficient documentation

## 2015-11-22 DIAGNOSIS — E119 Type 2 diabetes mellitus without complications: Secondary | ICD-10-CM | POA: Insufficient documentation

## 2015-11-22 DIAGNOSIS — R001 Bradycardia, unspecified: Secondary | ICD-10-CM | POA: Diagnosis not present

## 2015-11-22 DIAGNOSIS — I10 Essential (primary) hypertension: Secondary | ICD-10-CM | POA: Diagnosis not present

## 2015-11-22 DIAGNOSIS — E611 Iron deficiency: Secondary | ICD-10-CM | POA: Insufficient documentation

## 2015-11-22 DIAGNOSIS — K219 Gastro-esophageal reflux disease without esophagitis: Secondary | ICD-10-CM | POA: Insufficient documentation

## 2015-11-22 DIAGNOSIS — I493 Ventricular premature depolarization: Secondary | ICD-10-CM | POA: Diagnosis not present

## 2015-11-22 HISTORY — DX: Other amnesia: R41.3

## 2015-11-22 HISTORY — DX: Meningitis, unspecified: G03.9

## 2015-11-22 LAB — CBC WITH DIFFERENTIAL/PLATELET
BASOS ABS: 0.1 10*3/uL (ref 0.0–0.1)
Basophils Relative: 1 %
Eosinophils Absolute: 0.2 10*3/uL (ref 0.0–0.7)
Eosinophils Relative: 2 %
HEMATOCRIT: 42.7 % (ref 36.0–46.0)
Hemoglobin: 14.1 g/dL (ref 12.0–15.0)
LYMPHS PCT: 22 %
Lymphs Abs: 2 10*3/uL (ref 0.7–4.0)
MCH: 33 pg (ref 26.0–34.0)
MCHC: 33 g/dL (ref 30.0–36.0)
MCV: 100 fL (ref 78.0–100.0)
MONO ABS: 0.8 10*3/uL (ref 0.1–1.0)
MONOS PCT: 9 %
NEUTROS ABS: 6.1 10*3/uL (ref 1.7–7.7)
Neutrophils Relative %: 66 %
Platelets: 188 10*3/uL (ref 150–400)
RBC: 4.27 MIL/uL (ref 3.87–5.11)
RDW: 13.4 % (ref 11.5–15.5)
WBC: 9.1 10*3/uL (ref 4.0–10.5)

## 2015-11-22 LAB — COMPREHENSIVE METABOLIC PANEL
ALK PHOS: 48 U/L (ref 38–126)
ALT: 16 U/L (ref 14–54)
AST: 13 U/L — AB (ref 15–41)
Albumin: 4.5 g/dL (ref 3.5–5.0)
Anion gap: 11 (ref 5–15)
BILIRUBIN TOTAL: 0.9 mg/dL (ref 0.3–1.2)
BUN: 27 mg/dL — AB (ref 6–20)
CALCIUM: 10.2 mg/dL (ref 8.9–10.3)
CO2: 25 mmol/L (ref 22–32)
CREATININE: 1.05 mg/dL — AB (ref 0.44–1.00)
Chloride: 107 mmol/L (ref 101–111)
GFR calc Af Amer: 57 mL/min — ABNORMAL LOW (ref >60)
GFR, EST NON AFRICAN AMERICAN: 49 mL/min — AB (ref >60)
Glucose, Bld: 93 mg/dL (ref 65–99)
POTASSIUM: 3.2 mmol/L — AB (ref 3.5–5.1)
Sodium: 143 mmol/L (ref 135–145)
TOTAL PROTEIN: 6.9 g/dL (ref 6.5–8.1)

## 2015-11-22 LAB — APTT: APTT: 26 s (ref 24–37)

## 2015-11-22 LAB — URINALYSIS, ROUTINE W REFLEX MICROSCOPIC
Bilirubin Urine: NEGATIVE
GLUCOSE, UA: NEGATIVE mg/dL
HGB URINE DIPSTICK: NEGATIVE
KETONES UR: NEGATIVE mg/dL
Nitrite: NEGATIVE
PH: 5.5 (ref 5.0–8.0)
PROTEIN: NEGATIVE mg/dL
Specific Gravity, Urine: 1.018 (ref 1.005–1.030)

## 2015-11-22 LAB — PROTIME-INR
INR: 1.1 (ref 0.00–1.49)
PROTHROMBIN TIME: 14.4 s (ref 11.6–15.2)

## 2015-11-22 LAB — URINE MICROSCOPIC-ADD ON: RBC / HPF: NONE SEEN RBC/hpf (ref 0–5)

## 2015-11-22 LAB — SURGICAL PCR SCREEN
MRSA, PCR: NEGATIVE
Staphylococcus aureus: NEGATIVE

## 2015-11-22 NOTE — Progress Notes (Addendum)
PCP is Dr. Cresenciano Lick [place].LOV  09/2015 Cardio is Dr. 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 Daughter Duane Lope helping with questions & answers. Called office to have Jonell Cluck to look at UA results.

## 2015-11-22 NOTE — Pre-Procedure Instructions (Signed)
Donna Woods  11/22/2015      [facility] [address], [place] AT [facility] [address] [place] [address] Phone: [phone] Fax: [phone]  [facility] [address] [place] [address], [id number], [address] [place] [address] Phone: [phone] Fax: [phone]    Your procedure is scheduled on Tuesday, January 10th   Report to [facility] Admitting at 8:15 AM  Call this number if you have problems the morning of surgery:  [phone]   Remember:  Do not eat food or drink liquids after midnight Monday.  Take these medicines the morning of surgery with A SIP OF WATER : Atenolol, Omeprazole.  Please use inhaler the morning of surgery.              STOP taking any herbal supplements, vitamins, anti-inflammatories 4-5 days prior to surgery.   Do not wear jewelry, make-up or nail polish.  Do not wear lotions, powders, or perfumes.  You may NOT wear deodorant the day of surgery.  Do not shave underarms and legs 48 hours prior to surgery.     Do not bring valuables to the hospital.  [facility] is not responsible for any belongings or valuables.  Contacts, dentures or bridgework may not be worn into surgery.  Leave your suitcase in the car.  After surgery it may be brought to your room. For patients admitted to the hospital, discharge time will be determined by your treatment team.    Name and phone number of your driver:      Please read over the following fact sheets that you were given. Pain Booklet, Coughing and Deep Breathing, MRSA Information and Surgical Site Infection Prevention

## 2015-11-23 ENCOUNTER — Telehealth: Payer: Self-pay | Admitting: Internal Medicine

## 2015-11-23 DIAGNOSIS — M1712 Unilateral primary osteoarthritis, left knee: Secondary | ICD-10-CM | POA: Diagnosis not present

## 2015-11-23 NOTE — Telephone Encounter (Signed)
Daughter, Leigh Aurora calls today stating that they are having their surgical clearance visit with Dr. Durward Fortes and needed labs faxed.  Faxed labs to [phone].    Levada Dy called back; states that Potassium is 3.2 on 12/27.  On 11/15, K+ was 4.2 when patient was last seen here in the office.  Patient's husband advised that Dr. Renold Genta had not ordered a refill on her Potassium and that's why it was low.  Patient has never been prescribed Potassium by our office.  DeAnna contacted [facility] [place] and they do not have Potassium listed on patient profile at all.    Victoriano Lain of this information.  Levada Dy is awaiting a call back from [place], [place] from Dr. Rudene Anda office.  She will make him aware that Potassium has never been prescribed.  She states that her Mother most likely has depletion of potassium due to poor diet and drinking 5 Diet Cokes per day and little to no water intake.  She will advise Aaron Edelman the PA that they will try to increase her dietary intake of Potassium rich foods and perhaps ask for a prescription of potassium for the next week to get her potassium up and back to normal prior to her surgery scheduled for 11/30/15 by Dr. Durward Fortes.    Dr. Renold Genta, [facility] [place].

## 2015-11-24 LAB — URINE CULTURE: Culture: >=100000

## 2015-11-24 NOTE — H&P (Signed)
CHIEF COMPLAINT:  Painful left knee.    HISTORY OF PRESENT ILLNESS:  Donna Woods is a very pleasant 81 year old white female who is seen today for evaluation of her left knee.  She has had problems with her left knee in the past and actually had an arthroscopic debridement in February 2005 of the left knee.  She has been doing fairly well.  She has had a right total knee arthroplasty in 2003 and actually has had very good results with it so far.  Her biggest problem is she has had worsening pain and discomfort in the left knee with feelings of instability and she is afraid of falling as it tries to give way on her. Since back in October she started having persistent pain.  She has had previous corticosteroid injections.  The last one in October 2016 and she states it did not work.  The pain has come back and she is now having nighttime pain as well as pain with every step.  Complains of swelling in the knee, as well as that giving way.  She has failed the corticosteroid injections.  She is seen today for evaluation.     PAST MEDICAL HISTORY:  In general, her health is good.   Hospitalizations: 1.  In 1969 for open cholecystectomy. 2.  In 1968 for hysterectomy. 3.  October 2003, right total knee arthroplasty. 4.  February 2005, left knee arthroscopic debridement.    MEDICATIONS: 1.  Atenolol 25 mg tablet one-half daily. 2.  Triamterene/hydrochlorothiazide 37.5/25 one-half tablet daily.  3.  Omeprazole 20 mg daily.  4.  Tylenol Extra-Strength daily. 5.  ALT, which is an herbal antihistamine, daily.   6.  Vitamin D3 5000 International Units daily.    ALLERGIES:  Latex, tape, and penicillin.    REVIEW OF SYSTEMS:  A 14-point review of systems is positive for early cataracts.  She does have some tinnitus.  Occasional palpitations noted.  History of whooping cough in the past.  Sometimes she does have problems with nocturia.  She also has problems with nervous tension and she exhibits that at times.    FAMILY HISTORY:  Positive mother who died in her 74s from Charcot-Marie-Tooth disease, father who died around 49 years of age from BOOP.  One brother who died at age 37 from hemorrhagic CVA, one who is living at 58, in good health otherwise.  She has a sister, age 76, in good health.     SOCIAL HISTORY:  Donna Woods is a very pleasant 81 year old white, married, female, retired Radio producer.  She denies the use of tobacco or alcohol.     PHYSICAL EXAMINATION:   General:  A 81 year old white female, well developed, well nourished, alert, cooperative, in moderate distress secondary to left knee pain.  Height 5 feet 4-1/2 inches, weight 149 pounds.  BMI 25.2.   Vital signs:  Temperature 97.3, pulse 39, respirations 12, blood pressure 120/70.   Head:  Normocephalic.  Eyes:  Pupils equal, round and reactive to light and accommodation with extraocular movements intact.  Ears, nose and throat:  Benign. Chest:  Good expansion.   Lungs:  Essentially clear. Cardiac:   Regular rhythm and rate.  Bradycardia with distal heart sounds.  No murmurs are noted.     Pulses:  1+ bilateral and symmetric in the lower extremities.   Abdomen:  Scaphoid, soft, nontender.  No masses palpable.  Normal bowel sounds present.   CNS:  Oriented x3.  Cranial nerves II-XII grossly intact.   Musculoskeletal:  Today she ranges from 3-115 degrees.  She has crepitus with range of motion.  She is quite valgus.  I can correct her not quite back to neutral at this time.  She does have a trace effusion.  Pseudolaxity with varus stressing but an endpoint is felt.      RADIOGRAPHS:  Radiographic studies revealed marked valgus deformity with bone-on-bone lateral compartment OA and periarticular spurring and ossicles more lateral than medial.  Marked sclerosing of the distal femoral condyle and proximal tibial plateau and 21 degrees of valgus on a standing film was noted.  She does have patellofemoral changes also.     CLINICAL IMPRESSION:   End-stage OA of the left knee.     RECOMMENDATIONS:  At this time, I have read documentation from Dr. Renold Genta and Dr. Gwenlyn Found.  They feel that from medical and cardiac standpoint, she is a candidate for surgical intervention.  Therefore, our plan is to proceed with a left total knee arthroplasty in the very near future.  Procedure risks and benefits were fully explained to the patient using appropriate models to show the prosthesis.  I have gone over the hospital stay and she would like to go to [facility] facility postop on Friday after surgery.  We will plan for this and I have explained to the family to make sure that they contact the facility prior to her surgery  Mike Craze. [place], [place] [phone]  12/05/2015 5:22 PM

## 2015-11-24 NOTE — Telephone Encounter (Signed)
Patient is on Maxide 25. She had presurgical labs for knee replacement and potassium was 3.2 but previously at last visit here it was within normal limits. She is not on potassium replacement because Maxide 25 is a potassium sparing diuretic and she previously has not been hypokalemic. At last visit here potassium was 4.2. On December 27 it was 3.2. I suggested that she have potassium repeated here today and we will see if she needs potassium supplementation prior to her surgery.  She was seen recently by cardiologist, Dr. Gwenlyn Found and was surgically cleared

## 2015-11-25 ENCOUNTER — Other Ambulatory Visit: Payer: Self-pay | Admitting: Internal Medicine

## 2015-11-25 ENCOUNTER — Other Ambulatory Visit (INDEPENDENT_AMBULATORY_CARE_PROVIDER_SITE_OTHER): Payer: Medicare Other | Admitting: Internal Medicine

## 2015-11-25 DIAGNOSIS — I872 Venous insufficiency (chronic) (peripheral): Secondary | ICD-10-CM | POA: Insufficient documentation

## 2015-11-25 DIAGNOSIS — M1711 Unilateral primary osteoarthritis, right knee: Secondary | ICD-10-CM | POA: Insufficient documentation

## 2015-11-25 DIAGNOSIS — I878 Other specified disorders of veins: Secondary | ICD-10-CM | POA: Insufficient documentation

## 2015-11-25 DIAGNOSIS — E876 Hypokalemia: Secondary | ICD-10-CM

## 2015-11-25 DIAGNOSIS — R7302 Impaired glucose tolerance (oral): Secondary | ICD-10-CM | POA: Insufficient documentation

## 2015-11-25 DIAGNOSIS — R8290 Unspecified abnormal findings in urine: Secondary | ICD-10-CM | POA: Diagnosis not present

## 2015-11-25 LAB — POCT URINALYSIS DIPSTICK
Bilirubin, UA: NEGATIVE
GLUCOSE UA: NEGATIVE
Ketones, UA: NEGATIVE
Leukocytes, UA: NEGATIVE
NITRITE UA: NEGATIVE
Protein, UA: NEGATIVE
Spec Grav, UA: 1.02
UROBILINOGEN UA: 0.2
pH, UA: 6.5

## 2015-11-25 LAB — POTASSIUM: Potassium: 3.6 mmol/L (ref 3.5–5.3)

## 2015-11-25 NOTE — Progress Notes (Addendum)
Subjective:    Patient ID: Donna Woods, female    DOB: 16-Jun-1934, 81 y.o.   MRN: [id number]  HPI  81 year old White Female in today for health maintenance exam and evaluation of medical issues as well as surgical clearance for right knee replacement by Dr. Durward Fortes. Daughter, who is a Marine scientist, hass noticed that at times patient has low heart rate in the 30s and 40s but is asymptomatic. She has appointment to see Cardiologist in the near future. Daughter has also noticed a pulsation on right side of neck. Patient complains of ringing in her ears.    Patient has a history of sinus bradycardia, essential hypertension, PVCs , dementia and hyperlipidemia. Patient is followed by Dr. Quay Burow, cardiologist. Patient had a stress test in 2010 which was nonischemic. Last saw Dr. Alvester Chou August 2015. PVCs are controlled on beta blocker. Patient denies chest pain or shortness of breath.   Patient was hospitalized in December 2010 with a meningitis type process. She presented with a headache. 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 It was thought she had a viral syndrome. She was discharged from the hospital but her condition worsened. She will return to the hospital with increasing confusion, headache and meningismus. She was placed on antibiotics after which she improved. She was taken off antibiotics and worsened within 48 hours. She developed a left hemi-Sisson confusion. MRI showed a proteinaceous exudate on the surface of the brain right side greater than the left. She had an infectious disease consultation. Antibiotic coverage was continued. She improved slowly. She went to a rehabilitation facility. Since then, she's had significant memory impairment and almost appears to have some early dementia. Short-term memory  Remains a problem. She has a gait disorder. She requires assistance at times to ambulate. She tends to fall backward easily.  Past medical history: Knee replacement  2005, total hysterectomy approximate 1968. Cholecystectomy 1969. History of seasonal allergies , arthritis, palpitations.   Social history: Nonsmoker. No alcohol consumption. She is married. She is retired Radio producer. 2 adult daughters. Husband is very supportive.   Family history: Father died at age 68 of respiratory failure. Mother died at age 24 of neurological disease. One brother with history of neurological disorder (CMT disease). Another brother in good health. Sister in good health. Daughter with hypertension and glucose  Intolerance.   Patient also has GE reflux, osteoporosis, fibromyalgia type pain. She has a history of vitamin D deficiency. History of chronic venous stasis and stasis dermatitis. History of hypertension. History of left inguinal hernia.    Review of Systems  Appetite okay, pleasant and cooperative, short-term memory issues. Difficulty ambulating which she blames on her right knee     Objective:   Physical Exam  Constitutional: She is oriented to person, place, and time. She appears well-developed and well-nourished. No distress.  HENT:  Head: Normocephalic and atraumatic.  Right Ear: External ear normal.  Left Ear: External ear normal.  Mouth/Throat: Oropharynx is clear and moist. No oropharyngeal exudate.  Eyes: Conjunctivae and EOM are normal. Pupils are equal, round, and reactive to light. Right eye exhibits no discharge. No scleral icterus.  Neck: Neck supple. No JVD present. No thyromegaly present.  No carotid bruits  Cardiovascular: Regular rhythm, normal heart sounds and intact distal pulses.   No murmur heard. Sinus bradycardia  Pulmonary/Chest: Effort normal and breath sounds normal. She has no wheezes. She has no rales.  Breasts normal female  Abdominal: Soft. Bowel sounds are normal. She exhibits no distension  and no mass. There is no tenderness. There is no rebound and no guarding.  Genitourinary:  Deferred status post hysterectomy    Musculoskeletal: She exhibits no edema.  Lymphadenopathy:    She has no cervical adenopathy.  Neurological: She is alert and oriented to person, place, and time. She has normal reflexes. No cranial nerve deficit. Coordination normal.  Skin: Skin is warm and dry. She is not diaphoretic.  Psychiatric: She has a normal mood and affect. Her behavior is normal. Judgment and thought content normal.  Vitals reviewed.         Assessment & Plan:   Sinus bradycardia-previous history of bradycardia followed by cardiologist. Asymptomatic with bradycardia  History of PVCs-- controlled with beta blocker  Osteoporosis  GE reflux  End-stage osteoarthritis right knee -anticipate right knee replacement in the near future  Essential hypertension-stable on current regimen  History of chronic venous stasis and stasis dermatitis  History of allergic rhinitis  Impaired glucose tolerance-hemoglobin A1c stable at 5.8%  Tinnitus  Mixed hyperlipidemia  History of meningitis type process in 2010 with resultant short-term memory loss  Plan: 24 hour Holter monitor for evaluation of cardiac rhythm. To see cardiologist in the near future before knee replacement surgery. Continue same medications.    Subjective:   Patient presents for Medicare Annual/Subsequent preventive examination.  Review Past Medical/Family/Social: See above   Risk Factors  Current exercise habits: Sedentary Dietary issues discussed: Low fat low carbohydrate  Cardiac risk factors: Hyperlipidemia  Depression Screen  (Note: if answer to either of the following is "Yes", a more complete depression screening is indicated)   Over the past two weeks, have you felt down, depressed or hopeless? No  Over the past two weeks, have you felt little interest or pleasure in doing things? No Have you lost interest or pleasure in daily life? No Do you often feel hopeless? No Do you cry easily over simple problems? No   Activities  of Daily Living  In your present state of health, do you have any difficulty performing the following activities?:   Driving? No  Managing money? No  Feeding yourself? No  Getting from bed to chair? No  Climbing a flight of stairs? No  Preparing food and eating?: No  Bathing or showering? No  Getting dressed: No  Getting to the toilet? No  Using the toilet:No  Moving around from place to place: No  In the past year have you fallen or had a near fall?:No  Are you sexually active? No  Do you have more than one partner? No   Hearing Difficulties: No  Do you often ask people to speak up or repeat themselves? No  Do you experience ringing or noises in your ears? yes Do you have difficulty understanding soft or whispered voices? No  Do you feel that you have a problem with memory? No Do you often misplace items? No    Home Safety:  Do you have a smoke alarm at your residence? Yes Do you have grab bars in the bathroom? No Do you have throw rugs in your house? Yes   Cognitive Testing  Alert? Yes Normal Appearance?Yes  Oriented to person? Yes Place? Yes  Time? Yes  Recall of three objects? Not tested Can perform simple calculations? Not tested Displays appropriate judgment?Yes  Can read the correct time from a watch face?Yes   List the Names of Other Physician/Practitioners you currently use:  See referral list for the physicians patient is currently seeing.  Dr. Quay Burow, cardiologist   Review of Systems: See above   Objective:     General appearance: Appears stated age  Head: Normocephalic, without obvious abnormality, atraumatic  Eyes: conj clear, EOMi PEERLA  Ears: normal TM's and external ear canals both ears  Nose: Nares normal. Septum midline. Mucosa normal. No drainage or sinus tenderness.  Throat: lips, mucosa, and tongue normal; teeth and gums normal  Neck: no adenopathy, no carotid bruit, no JVD, supple, symmetrical, trachea midline and thyroid not  enlarged, symmetric, no tenderness/mass/nodules  No CVA tenderness.  Lungs: clear to auscultation bilaterally  Breasts: normal appearance, no masses or tenderness,  Heart: regular rate and rhythm, S1, S2 normal, no murmur, click, rub or gallop  Abdomen: soft, non-tender; bowel sounds normal; no masses, no organomegaly  Musculoskeletal: ROM normal in all joints, no crepitus, no deformity, Normal muscle strengthen. Back  is symmetric, no curvature. Skin: Skin color, texture, turgor normal. No rashes or lesions  Lymph nodes: Cervical, supraclavicular, and axillary nodes normal.  Neurologic: CN 2 -12 Normal, Normal symmetric reflexes. Normal coordination and gait  Psych: Alert & Oriented x 3, Mood appear stable.    Assessment:    Annual wellness medicare exam   Plan:    During the course of the visit the patient was educated and counseled about appropriate screening and preventive services including:   Annual mammogram  Annual flu vaccine     Patient Instructions (the written plan) was given to the patient.  Medicare Attestation  I have personally reviewed:  The patient's medical and social history  Their use of alcohol, tobacco or illicit drugs  Their current medications and supplements  The patient's functional ability including ADLs,fall risks, home safety risks, cognitive, and hearing and visual impairment  Diet and physical activities  Evidence for depression or mood disorders  The patient's weight, height, BMI, and visual acuity have been recorded in the chart. I have made referrals, counseling, and provided education to the patient based on review of the above and I have provided the patient with a written personalized care plan for preventive services.

## 2015-11-25 NOTE — Patient Instructions (Addendum)
Continue same medications and return in one year or as needed. See Cardiologist for Cardiology clearance for knee replacement. 24 hour Holter monitor ordered with history of bradycardia and PVCs.

## 2015-12-05 MED ORDER — SODIUM CHLORIDE 0.9 % IV SOLN: INTRAVENOUS | Status: DC

## 2015-12-05 MED ORDER — CLINDAMYCIN PHOSPHATE 900 MG/50ML IV SOLN
900.0000 mg | INTRAVENOUS | Status: AC
  Administered 2015-12-06: 900 mg via INTRAVENOUS
  Filled 2015-12-05: qty 50

## 2015-12-05 MED ORDER — CHLORHEXIDINE GLUCONATE 4 % EX LIQD: 60.0000 mL | Freq: Once | CUTANEOUS | Status: DC

## 2015-12-05 MED ORDER — TRANEXAMIC ACID 1000 MG/10ML IV SOLN
2000.0000 mg | INTRAVENOUS | Status: AC
  Administered 2015-12-06: 2000 mg via TOPICAL
  Filled 2015-12-05: qty 20

## 2015-12-05 MED ORDER — ACETAMINOPHEN 10 MG/ML IV SOLN
1000.0000 mg | Freq: Four times a day (QID) | INTRAVENOUS | Status: DC
  Filled 2015-12-05 (×2): qty 100

## 2015-12-06 ENCOUNTER — Encounter (HOSPITAL_COMMUNITY)
Admission: RE | Disposition: A | Discharge status: Skilled Nursing Facility | Payer: Self-pay | Source: Ambulatory Visit | Attending: Orthopaedic Surgery

## 2015-12-06 ENCOUNTER — Inpatient Hospital Stay (HOSPITAL_COMMUNITY): Payer: Medicare Other | Admitting: Certified Registered Nurse Anesthetist

## 2015-12-06 ENCOUNTER — Encounter (HOSPITAL_COMMUNITY): Payer: Self-pay | Admitting: *Deleted

## 2015-12-06 ENCOUNTER — Inpatient Hospital Stay (HOSPITAL_COMMUNITY)
Admission: RE | Admit: 2015-12-06 | Discharge: 2015-12-09 | DRG: 470 | Disposition: A | Discharge status: Skilled Nursing Facility | Payer: Medicare Other | Source: Ambulatory Visit | Attending: Orthopaedic Surgery | Admitting: Orthopaedic Surgery

## 2015-12-06 DIAGNOSIS — E876 Hypokalemia: Secondary | ICD-10-CM | POA: Diagnosis present

## 2015-12-06 DIAGNOSIS — G8918 Other acute postprocedural pain: Secondary | ICD-10-CM | POA: Diagnosis not present

## 2015-12-06 DIAGNOSIS — M797 Fibromyalgia: Secondary | ICD-10-CM | POA: Diagnosis present

## 2015-12-06 DIAGNOSIS — D62 Acute posthemorrhagic anemia: Secondary | ICD-10-CM | POA: Diagnosis not present

## 2015-12-06 DIAGNOSIS — K219 Gastro-esophageal reflux disease without esophagitis: Secondary | ICD-10-CM | POA: Diagnosis present

## 2015-12-06 DIAGNOSIS — M7122 Synovial cyst of popliteal space [Baker], left knee: Secondary | ICD-10-CM | POA: Diagnosis present

## 2015-12-06 DIAGNOSIS — Z96652 Presence of left artificial knee joint: Secondary | ICD-10-CM | POA: Diagnosis not present

## 2015-12-06 DIAGNOSIS — Z96651 Presence of right artificial knee joint: Secondary | ICD-10-CM | POA: Diagnosis present

## 2015-12-06 DIAGNOSIS — I493 Ventricular premature depolarization: Secondary | ICD-10-CM | POA: Diagnosis not present

## 2015-12-06 DIAGNOSIS — M65862 Other synovitis and tenosynovitis, left lower leg: Secondary | ICD-10-CM | POA: Diagnosis present

## 2015-12-06 DIAGNOSIS — M1712 Unilateral primary osteoarthritis, left knee: Principal | ICD-10-CM | POA: Diagnosis present

## 2015-12-06 DIAGNOSIS — G709 Myoneural disorder, unspecified: Secondary | ICD-10-CM | POA: Diagnosis present

## 2015-12-06 DIAGNOSIS — D509 Iron deficiency anemia, unspecified: Secondary | ICD-10-CM | POA: Diagnosis not present

## 2015-12-06 DIAGNOSIS — Z96659 Presence of unspecified artificial knee joint: Secondary | ICD-10-CM

## 2015-12-06 DIAGNOSIS — R001 Bradycardia, unspecified: Secondary | ICD-10-CM | POA: Diagnosis not present

## 2015-12-06 DIAGNOSIS — M25562 Pain in left knee: Secondary | ICD-10-CM | POA: Diagnosis not present

## 2015-12-06 DIAGNOSIS — M179 Osteoarthritis of knee, unspecified: Secondary | ICD-10-CM | POA: Diagnosis not present

## 2015-12-06 DIAGNOSIS — Z8661 Personal history of infections of the central nervous system: Secondary | ICD-10-CM | POA: Diagnosis not present

## 2015-12-06 DIAGNOSIS — R7309 Other abnormal glucose: Secondary | ICD-10-CM | POA: Diagnosis not present

## 2015-12-06 DIAGNOSIS — E539 Vitamin B deficiency, unspecified: Secondary | ICD-10-CM | POA: Diagnosis not present

## 2015-12-06 DIAGNOSIS — I10 Essential (primary) hypertension: Secondary | ICD-10-CM | POA: Diagnosis present

## 2015-12-06 HISTORY — PX: TOTAL KNEE ARTHROPLASTY: SHX125

## 2015-12-06 LAB — TYPE AND SCREEN
ABO/RH(D): A POS
ANTIBODY SCREEN: POSITIVE

## 2015-12-06 SURGERY — ARTHROPLASTY, KNEE, TOTAL
Anesthesia: Monitor Anesthesia Care | Site: Knee | Laterality: Left

## 2015-12-06 MED ORDER — METOCLOPRAMIDE HCL 5 MG/ML IJ SOLN: 5.0000 mg | Freq: Three times a day (TID) | INTRAMUSCULAR | Status: DC | PRN

## 2015-12-06 MED ORDER — DEXTROSE 5 % IV SOLN
500.0000 mg | Freq: Four times a day (QID) | INTRAVENOUS | Status: DC | PRN
Start: 2015-12-06 — End: 2015-12-09

## 2015-12-06 MED ORDER — GLYCOPYRROLATE 0.2 MG/ML IJ SOLN
INTRAMUSCULAR | Status: AC
  Filled 2015-12-06: qty 3

## 2015-12-06 MED ORDER — SODIUM CHLORIDE 0.9 % IR SOLN
Status: DC | PRN
  Administered 2015-12-06: 1000 mL

## 2015-12-06 MED ORDER — SODIUM CHLORIDE 0.9 % IV SOLN
INTRAVENOUS | Status: DC
  Administered 2015-12-06: 17:00:00 via INTRAVENOUS

## 2015-12-06 MED ORDER — CLINDAMYCIN PHOSPHATE 600 MG/50ML IV SOLN
600.0000 mg | Freq: Four times a day (QID) | INTRAVENOUS | Status: AC
  Administered 2015-12-06 – 2015-12-07 (×3): 600 mg via INTRAVENOUS
  Filled 2015-12-06 (×3): qty 50

## 2015-12-06 MED ORDER — OXYCODONE HCL 5 MG PO TABS
5.0000 mg | ORAL_TABLET | Freq: Once | ORAL | Status: DC | PRN
Start: 2015-12-06 — End: 2015-12-06

## 2015-12-06 MED ORDER — ONDANSETRON HCL 4 MG/2ML IJ SOLN: 4.0000 mg | Freq: Four times a day (QID) | INTRAMUSCULAR | Status: DC | PRN

## 2015-12-06 MED ORDER — METHOCARBAMOL 500 MG PO TABS
500.0000 mg | ORAL_TABLET | Freq: Four times a day (QID) | ORAL | Status: DC | PRN
  Administered 2015-12-08: 500 mg via ORAL
  Filled 2015-12-06: qty 1

## 2015-12-06 MED ORDER — ALBUTEROL SULFATE (2.5 MG/3ML) 0.083% IN NEBU: 2.5000 mg | INHALATION_SOLUTION | Freq: Four times a day (QID) | RESPIRATORY_TRACT | Status: DC | PRN

## 2015-12-06 MED ORDER — ATENOLOL 12.5 MG HALF TABLET
12.5000 mg | ORAL_TABLET | Freq: Every day | ORAL | Status: DC
  Administered 2015-12-07 – 2015-12-09 (×3): 12.5 mg via ORAL
  Filled 2015-12-06 (×3): qty 1

## 2015-12-06 MED ORDER — ROCURONIUM BROMIDE 50 MG/5ML IV SOLN
INTRAVENOUS | Status: AC
  Filled 2015-12-06: qty 1

## 2015-12-06 MED ORDER — LACTATED RINGERS IV SOLN
INTRAVENOUS | Status: DC
  Administered 2015-12-06 (×3): via INTRAVENOUS

## 2015-12-06 MED ORDER — PHENYLEPHRINE 40 MCG/ML (10ML) SYRINGE FOR IV PUSH (FOR BLOOD PRESSURE SUPPORT)
PREFILLED_SYRINGE | INTRAVENOUS | Status: AC
  Filled 2015-12-06: qty 10

## 2015-12-06 MED ORDER — POTASSIUM CHLORIDE CRYS ER 20 MEQ PO TBCR
10.0000 meq | EXTENDED_RELEASE_TABLET | Freq: Every day | ORAL | Status: DC
Start: 2015-12-06 — End: 2015-12-09
  Administered 2015-12-07 – 2015-12-09 (×3): 10 meq via ORAL
  Filled 2015-12-06 (×4): qty 1

## 2015-12-06 MED ORDER — ARTIFICIAL TEARS OP OINT
TOPICAL_OINTMENT | OPHTHALMIC | Status: AC
  Filled 2015-12-06: qty 3.5

## 2015-12-06 MED ORDER — BISACODYL 10 MG RE SUPP: 10.0000 mg | Freq: Every day | RECTAL | Status: DC | PRN

## 2015-12-06 MED ORDER — POLYETHYLENE GLYCOL 3350 17 G PO PACK: 17.0000 g | PACK | Freq: Every day | ORAL | Status: DC | PRN

## 2015-12-06 MED ORDER — NEOSTIGMINE METHYLSULFATE 10 MG/10ML IV SOLN
INTRAVENOUS | Status: AC
  Filled 2015-12-06: qty 1

## 2015-12-06 MED ORDER — ONDANSETRON HCL 4 MG/2ML IJ SOLN
INTRAMUSCULAR | Status: AC
  Filled 2015-12-06: qty 2

## 2015-12-06 MED ORDER — FENTANYL CITRATE (PF) 250 MCG/5ML IJ SOLN
INTRAMUSCULAR | Status: AC
  Filled 2015-12-06: qty 5

## 2015-12-06 MED ORDER — MENTHOL 3 MG MT LOZG: 1.0000 | LOZENGE | OROMUCOSAL | Status: DC | PRN

## 2015-12-06 MED ORDER — BUPIVACAINE-EPINEPHRINE 0.25% -1:200000 IJ SOLN
INTRAMUSCULAR | Status: DC | PRN
  Administered 2015-12-06: 15 mL

## 2015-12-06 MED ORDER — PHENOL 1.4 % MT LIQD
1.0000 | OROMUCOSAL | Status: DC | PRN
Start: 2015-12-06 — End: 2015-12-09

## 2015-12-06 MED ORDER — HYDROMORPHONE HCL 1 MG/ML IJ SOLN
0.5000 mg | INTRAMUSCULAR | Status: DC | PRN
  Administered 2015-12-06: 0.5 mg via INTRAVENOUS
  Filled 2015-12-06: qty 1

## 2015-12-06 MED ORDER — ONDANSETRON HCL 4 MG/2ML IJ SOLN
INTRAMUSCULAR | Status: DC | PRN
  Administered 2015-12-06: 4 mg via INTRAVENOUS

## 2015-12-06 MED ORDER — METOCLOPRAMIDE HCL 5 MG PO TABS: 5.0000 mg | ORAL_TABLET | Freq: Three times a day (TID) | ORAL | Status: DC | PRN

## 2015-12-06 MED ORDER — DIPHENHYDRAMINE HCL 12.5 MG/5ML PO ELIX: 12.5000 mg | ORAL_SOLUTION | ORAL | Status: DC | PRN

## 2015-12-06 MED ORDER — EPHEDRINE SULFATE 50 MG/ML IJ SOLN
INTRAMUSCULAR | Status: AC
  Filled 2015-12-06: qty 1

## 2015-12-06 MED ORDER — FENTANYL CITRATE (PF) 100 MCG/2ML IJ SOLN
INTRAMUSCULAR | Status: AC
  Administered 2015-12-06: 50 ug via INTRAVENOUS
  Filled 2015-12-06: qty 2

## 2015-12-06 MED ORDER — ACETAMINOPHEN 10 MG/ML IV SOLN
INTRAVENOUS | Status: DC | PRN
Start: 2015-12-06 — End: 2015-12-06
  Administered 2015-12-06: 1000 mg via INTRAVENOUS

## 2015-12-06 MED ORDER — LORATADINE 10 MG PO TABS
10.0000 mg | ORAL_TABLET | Freq: Every day | ORAL | Status: DC
Start: 2015-12-06 — End: 2015-12-09
  Administered 2015-12-07 – 2015-12-09 (×3): 10 mg via ORAL
  Filled 2015-12-06 (×4): qty 1

## 2015-12-06 MED ORDER — PROPOFOL 500 MG/50ML IV EMUL
INTRAVENOUS | Status: DC | PRN
  Administered 2015-12-06: 25 ug/kg/min via INTRAVENOUS

## 2015-12-06 MED ORDER — ACETAMINOPHEN 10 MG/ML IV SOLN
1000.0000 mg | Freq: Four times a day (QID) | INTRAVENOUS | Status: AC
  Administered 2015-12-06 – 2015-12-07 (×3): 1000 mg via INTRAVENOUS
  Filled 2015-12-06 (×4): qty 100

## 2015-12-06 MED ORDER — FENTANYL CITRATE (PF) 100 MCG/2ML IJ SOLN
50.0000 ug | Freq: Once | INTRAMUSCULAR | Status: AC
  Administered 2015-12-06: 50 ug via INTRAVENOUS

## 2015-12-06 MED ORDER — FENTANYL CITRATE (PF) 250 MCG/5ML IJ SOLN
INTRAMUSCULAR | Status: DC | PRN
  Administered 2015-12-06 (×2): 25 ug via INTRAVENOUS

## 2015-12-06 MED ORDER — SODIUM CHLORIDE 0.9 % IJ SOLN
INTRAMUSCULAR | Status: AC
  Filled 2015-12-06: qty 10

## 2015-12-06 MED ORDER — BUPIVACAINE IN DEXTROSE 0.75-8.25 % IT SOLN
INTRATHECAL | Status: DC | PRN
  Administered 2015-12-06: 1.4 mL via INTRATHECAL

## 2015-12-06 MED ORDER — ONDANSETRON HCL 4 MG PO TABS: 4.0000 mg | ORAL_TABLET | Freq: Four times a day (QID) | ORAL | Status: DC | PRN

## 2015-12-06 MED ORDER — EPHEDRINE SULFATE 50 MG/ML IJ SOLN
INTRAMUSCULAR | Status: DC | PRN
  Administered 2015-12-06: 15 mg via INTRAVENOUS
  Administered 2015-12-06 (×2): 10 mg via INTRAVENOUS
  Administered 2015-12-06: 15 mg via INTRAVENOUS

## 2015-12-06 MED ORDER — MIDAZOLAM HCL 2 MG/2ML IJ SOLN
INTRAMUSCULAR | Status: AC
  Filled 2015-12-06: qty 2

## 2015-12-06 MED ORDER — TRIAMTERENE-HCTZ 37.5-25 MG PO TABS
0.5000 | ORAL_TABLET | Freq: Every day | ORAL | Status: DC
  Administered 2015-12-06: 0.5 via ORAL
  Filled 2015-12-06 (×5): qty 0.5

## 2015-12-06 MED ORDER — LIDOCAINE HCL (CARDIAC) 20 MG/ML IV SOLN
INTRAVENOUS | Status: AC
  Filled 2015-12-06: qty 5

## 2015-12-06 MED ORDER — KETOROLAC TROMETHAMINE 15 MG/ML IJ SOLN
7.5000 mg | Freq: Four times a day (QID) | INTRAMUSCULAR | Status: DC
  Administered 2015-12-06 – 2015-12-07 (×2): 7.5 mg via INTRAVENOUS
  Filled 2015-12-06 (×2): qty 1

## 2015-12-06 MED ORDER — FENTANYL CITRATE (PF) 100 MCG/2ML IJ SOLN
25.0000 ug | INTRAMUSCULAR | Status: DC | PRN
  Administered 2015-12-06: 25 ug via INTRAVENOUS
  Administered 2015-12-06: 50 ug via INTRAVENOUS

## 2015-12-06 MED ORDER — BUPIVACAINE-EPINEPHRINE (PF) 0.25% -1:200000 IJ SOLN
INTRAMUSCULAR | Status: AC
  Filled 2015-12-06: qty 30

## 2015-12-06 MED ORDER — ONDANSETRON HCL 4 MG/2ML IJ SOLN
4.0000 mg | Freq: Four times a day (QID) | INTRAMUSCULAR | Status: DC | PRN
  Administered 2015-12-07: 4 mg via INTRAVENOUS
  Filled 2015-12-06: qty 2

## 2015-12-06 MED ORDER — OXYCODONE HCL 5 MG PO TABS
5.0000 mg | ORAL_TABLET | ORAL | Status: DC | PRN
  Administered 2015-12-06 – 2015-12-07 (×5): 10 mg via ORAL
  Administered 2015-12-08 (×2): 5 mg via ORAL
  Administered 2015-12-08: 10 mg via ORAL
  Administered 2015-12-08 – 2015-12-09 (×5): 5 mg via ORAL
  Filled 2015-12-06 (×2): qty 2
  Filled 2015-12-06: qty 1
  Filled 2015-12-06: qty 2
  Filled 2015-12-06 (×2): qty 1
  Filled 2015-12-06: qty 2
  Filled 2015-12-06 (×2): qty 1
  Filled 2015-12-06 (×2): qty 2
  Filled 2015-12-06: qty 1
  Filled 2015-12-06 (×3): qty 2

## 2015-12-06 MED ORDER — FENTANYL CITRATE (PF) 100 MCG/2ML IJ SOLN
INTRAMUSCULAR | Status: AC
  Filled 2015-12-06: qty 2

## 2015-12-06 MED ORDER — SUCCINYLCHOLINE CHLORIDE 20 MG/ML IJ SOLN
INTRAMUSCULAR | Status: AC
  Filled 2015-12-06: qty 1

## 2015-12-06 MED ORDER — BUPIVACAINE-EPINEPHRINE (PF) 0.5% -1:200000 IJ SOLN
INTRAMUSCULAR | Status: DC | PRN
  Administered 2015-12-06: 25 mL via PERINEURAL

## 2015-12-06 MED ORDER — GLYCOPYRROLATE 0.2 MG/ML IJ SOLN
INTRAMUSCULAR | Status: DC | PRN
  Administered 2015-12-06: 0.2 mg via INTRAVENOUS

## 2015-12-06 MED ORDER — PANTOPRAZOLE SODIUM 40 MG PO TBEC
40.0000 mg | DELAYED_RELEASE_TABLET | Freq: Every day | ORAL | Status: DC
  Administered 2015-12-07 – 2015-12-09 (×3): 40 mg via ORAL
  Filled 2015-12-06 (×3): qty 1

## 2015-12-06 MED ORDER — PROPOFOL 10 MG/ML IV BOLUS
INTRAVENOUS | Status: DC | PRN
  Administered 2015-12-06: 30 mg via INTRAVENOUS
  Administered 2015-12-06 (×2): 20 mg via INTRAVENOUS
  Administered 2015-12-06: 30 mg via INTRAVENOUS
  Administered 2015-12-06: 20 mg via INTRAVENOUS
  Administered 2015-12-06 (×2): 40 mg via INTRAVENOUS

## 2015-12-06 MED ORDER — MAGNESIUM CITRATE PO SOLN: 1.0000 | Freq: Once | ORAL | Status: DC | PRN

## 2015-12-06 MED ORDER — PROPOFOL 10 MG/ML IV BOLUS
INTRAVENOUS | Status: AC
  Filled 2015-12-06: qty 20

## 2015-12-06 MED ORDER — RIVAROXABAN 10 MG PO TABS
10.0000 mg | ORAL_TABLET | Freq: Every day | ORAL | Status: DC
  Administered 2015-12-07 – 2015-12-09 (×3): 10 mg via ORAL
  Filled 2015-12-06 (×3): qty 1

## 2015-12-06 MED ORDER — ALUM & MAG HYDROXIDE-SIMETH 200-200-20 MG/5ML PO SUSP
30.0000 mL | ORAL | Status: DC | PRN
  Administered 2015-12-07: 30 mL via ORAL
  Filled 2015-12-06: qty 30

## 2015-12-06 MED ORDER — OXYCODONE HCL 5 MG/5ML PO SOLN: 5.0000 mg | Freq: Once | ORAL | Status: DC | PRN

## 2015-12-06 MED ORDER — GLYCOPYRROLATE 0.2 MG/ML IJ SOLN
INTRAMUSCULAR | Status: AC
  Filled 2015-12-06: qty 1

## 2015-12-06 MED ORDER — DOCUSATE SODIUM 100 MG PO CAPS
100.0000 mg | ORAL_CAPSULE | Freq: Two times a day (BID) | ORAL | Status: DC
  Administered 2015-12-06 – 2015-12-09 (×6): 100 mg via ORAL
  Filled 2015-12-06 (×6): qty 1

## 2015-12-06 SURGICAL SUPPLY — 59 items
BANDAGE ESMARK 6X9 LF (GAUZE/BANDAGES/DRESSINGS) ×1 IMPLANT
BLADE SAGITTAL 25.0X1.19X90 (BLADE) ×2 IMPLANT
BLADE SAGITTAL 25.0X1.19X90MM (BLADE) ×1
BNDG CMPR 9X6 STRL LF SNTH (GAUZE/BANDAGES/DRESSINGS) ×1
BNDG ESMARK 6X9 LF (GAUZE/BANDAGES/DRESSINGS) ×3
BOWL SMART MIX CTS (DISPOSABLE) ×3 IMPLANT
CAP KNEE TOTAL 3 SIGMA ×2 IMPLANT
CEMENT HV SMART SET (Cement) ×6 IMPLANT
COVER SURGICAL LIGHT HANDLE (MISCELLANEOUS) ×3 IMPLANT
CUFF TOURNIQUET SINGLE 34IN LL (TOURNIQUET CUFF) IMPLANT
CUFF TOURNIQUET SINGLE 44IN (TOURNIQUET CUFF) IMPLANT
DRAPE EXTREMITY T 121X128X90 (DRAPE) ×3 IMPLANT
DRAPE PROXIMA HALF (DRAPES) ×3 IMPLANT
DRSG ADAPTIC 3X8 NADH LF (GAUZE/BANDAGES/DRESSINGS) ×3 IMPLANT
DRSG PAD ABDOMINAL 8X10 ST (GAUZE/BANDAGES/DRESSINGS) ×6 IMPLANT
DURAPREP 26ML APPLICATOR (WOUND CARE) ×6 IMPLANT
ELECT CAUTERY BLADE 6.4 (BLADE) ×3 IMPLANT
ELECT REM PT RETURN 9FT ADLT (ELECTROSURGICAL) ×3
ELECTRODE REM PT RTRN 9FT ADLT (ELECTROSURGICAL) ×1 IMPLANT
EVACUATOR 1/8 PVC DRAIN (DRAIN) IMPLANT
FACESHIELD WRAPAROUND (MASK) ×6 IMPLANT
FACESHIELD WRAPAROUND OR TEAM (MASK) ×2 IMPLANT
GAUZE SPONGE 4X4 12PLY STRL (GAUZE/BANDAGES/DRESSINGS) ×3 IMPLANT
GLOVE BIOGEL PI IND STRL 8 (GLOVE) ×1 IMPLANT
GLOVE BIOGEL PI IND STRL 8.5 (GLOVE) ×1 IMPLANT
GLOVE BIOGEL PI INDICATOR 8 (GLOVE) ×2
GLOVE BIOGEL PI INDICATOR 8.5 (GLOVE) ×2
GLOVE ECLIPSE 8.0 STRL XLNG CF (GLOVE) ×6 IMPLANT
GLOVE SURG ORTHO 8.5 STRL (GLOVE) ×6 IMPLANT
GOWN STRL REUS W/ TWL LRG LVL3 (GOWN DISPOSABLE) ×2 IMPLANT
GOWN STRL REUS W/TWL 2XL LVL3 (GOWN DISPOSABLE) ×3 IMPLANT
GOWN STRL REUS W/TWL LRG LVL3 (GOWN DISPOSABLE) ×6
HANDPIECE INTERPULSE COAX TIP (DISPOSABLE) ×3
KIT BASIN OR (CUSTOM PROCEDURE TRAY) ×3 IMPLANT
KIT ROOM TURNOVER OR (KITS) ×3 IMPLANT
MANIFOLD NEPTUNE II (INSTRUMENTS) ×3 IMPLANT
NEEDLE 22X1 1/2 (OR ONLY) (NEEDLE) ×3 IMPLANT
NS IRRIG 1000ML POUR BTL (IV SOLUTION) ×3 IMPLANT
PACK TOTAL JOINT (CUSTOM PROCEDURE TRAY) ×3 IMPLANT
PACK UNIVERSAL I (CUSTOM PROCEDURE TRAY) ×3 IMPLANT
PAD ARMBOARD 7.5X6 YLW CONV (MISCELLANEOUS) ×6 IMPLANT
PAD CAST 4YDX4 CTTN HI CHSV (CAST SUPPLIES) ×1 IMPLANT
PADDING CAST COTTON 4X4 STRL (CAST SUPPLIES) ×6
PADDING CAST COTTON 6X4 STRL (CAST SUPPLIES) ×3 IMPLANT
SET HNDPC FAN SPRY TIP SCT (DISPOSABLE) ×1 IMPLANT
STAPLER VISISTAT 35W (STAPLE) ×3 IMPLANT
SUCTION FRAZIER TIP 10 FR DISP (SUCTIONS) ×3 IMPLANT
SURGIFLO W/THROMBIN 8M KIT (HEMOSTASIS) IMPLANT
SUT BONE WAX W31G (SUTURE) ×3 IMPLANT
SUT ETHIBOND NAB CT1 #1 30IN (SUTURE) ×6 IMPLANT
SUT MNCRL AB 3-0 PS2 18 (SUTURE) ×3 IMPLANT
SUT VIC AB 0 CT1 27 (SUTURE) ×3
SUT VIC AB 0 CT1 27XBRD ANBCTR (SUTURE) ×1 IMPLANT
SYR CONTROL 10ML LL (SYRINGE) IMPLANT
TOWEL OR 17X24 6PK STRL BLUE (TOWEL DISPOSABLE) ×3 IMPLANT
TOWEL OR 17X26 10 PK STRL BLUE (TOWEL DISPOSABLE) ×3 IMPLANT
TRAY FOLEY CATH 16FRSI W/METER (SET/KITS/TRAYS/PACK) IMPLANT
WATER STERILE IRR 1000ML POUR (IV SOLUTION) ×1 IMPLANT
WRAP KNEE MAXI GEL POST OP (GAUZE/BANDAGES/DRESSINGS) ×1 IMPLANT

## 2015-12-06 NOTE — Op Note (Signed)
NAMEMarland Kitchen  KITZIA, CAMUS NO.:  [id number]  MEDICAL RECORD NO.:  [id number]  LOCATION:  [facility]                         FACILITY:  [facility]  PHYSICIAN:  Vonna Kotyk. Joeziah Voit, M.D.DATE OF BIRTH:  [birth year]  DATE OF PROCEDURE:  12/06/2015 DATE OF DISCHARGE:                              OPERATIVE REPORT   PREOPERATIVE DIAGNOSIS:  Primary end-stage osteoarthritis, left knee.  POSTOPERATIVE DIAGNOSIS:  Primary end-stage osteoarthritis, left knee.  PROCEDURE:  Left total knee replacement.  SURGEON:  Vonna Kotyk. Durward Fortes, M.D.  ASSISTANT:  Aaron Edelman D. Petrarca, PA-C was present throughout the operative procedure to ensure its timely completion.  ANESTHESIA:  Spinal with IV sedation.  COMPLICATIONS:  None.  COMPONENTS:  DePuy LCS standard femoral component, a #2.5 rotating keeled tibial tray.  A 10 mm polyethylene bridging bearing, metal back 3- peg rotating patella.  All components were secured with polymethyl methacrylate.  DESCRIPTION OF PROCEDURE:  Ms. Rotz was met in the holding area.  I identified the left knee as the proper operative site and marked it accordingly.  The patient was patient was then transported to room #7, where spinal anesthetic was administered by Anesthesia without difficulty.  She was then placed supine in the operating table under IV sedation.  Nursing staff inserted a Foley catheter.  We did obtain a specimen for urine culture and sensitivity, as she had a remote history of an E coli UTI.  The left lower extremity was then placed a thigh tourniquet.  The leg was prepped with chlorhexidine scrub and DuraPrep x2 from the tourniquet to the tips of the toes.  Sterile draping was performed.  Time-out was called.  With the extremity elevated, with Esmarch exsanguinated with a proximal tourniquet at 325 mmHg.  A longitudinal incision was made from the tibial tubercle to the superior pouch and via sharp dissection carried down to  subcutaneous tissue.  The first layer of capsule was incised in the midline and a medial parapatellar incision was then made through the deep capsule with the Bovie.  There was a small clear yellow joint effusion.  The patella was everted 180 degrees laterally and the knee flexed 90 degrees.  The patient had a fixed valgus position and slight flexion contracture preoperatively.  I was not able to completely correct the valgus on the table, but was quite close.  I just released a little bit of the tibia soft tissue on the lateral side.  The, I thought I would pretty close to neutral.  We did measure a standard femoral component.  First bony cut was made transversely on the proximal tibia with a 7- degree angle of declination.  I was careful not to take too much bone, as I thought that she was probably going to be quite loose and for that difficulty with balancing medially and laterally.  There was no articular cartilage remaining on the lateral femur or tibial plateau, and large osteophytes along the medial and lateral femoral condyle.  There was a moderate amount of beefy red synovitis.  A synovectomy was performed.  Subsequent bony cuts were then made on the femur using the standard femoral jig.  I used a 4-degree distal femoral valgus cut.  Flexion and  extension gaps were perfectly symmetrical at 10 mm, and MCL and LCL remained intact throughout the procedure.  Lamina spreaders were inserted along the medial and lateral compartment. For better visualization, I removed the medial and lateral menisci, ACL and PCL.  I removed osteophytes in the posterior femoral condyle medially and laterally.  There was a small Baker's cyst identified medially.  Its edges were Bovie coagulated.  Final cut was then made on the femur for tapering and to obtain the center holes.  Retractors were then placed about the tibia, was advanced anteriorly and measured 2.5 tibial tray.  This was pinned  in place.  Center hole was made followed by the keeled cut.  With the tibial jig in place, a 10 mm polyethylene bridging bearing trial was inserted, followed by the trial of standard femoral component, the construct was reduced and through a full range of motion, we had excellent position having reestablished normal alignment.  There was no opening with a varus or valgus stress.  The patella was prepared by removing approximately 9 mm of bone, leaving 13 mm of patella thickness.  Trial patella was inserted after making the appropriate 3 holes.  This was reduced and through a firm range of motion, it remained perfectly stable.  Trial components removed.  The joint was copiously irrigated with saline solution.  Each of the components were then impacted with polymethyl methacrylate.  We initially inserted the tibial trial tray, followed by the 10-mm polyethylene bridging bearing and the final standard femoral component.  These were impacted and knee was placed in extension, and the extraneous methacrylate was removed from the periphery.  The patella was applied with polymethyl methacrylate and a patellar clamp.  At approximately 16 minutes, the methacrylate had matured during which time, we applied bone wax to bleeding bone.  Tourniquet was deflated at 71 minutes and heavy bleeding was controlled with the Bovie.  We did apply topical tranexamic acid.  A trial polyethylene component was removed.  Extraneous hardened methacrylate behind the tibia was then removed and any bleeding was controlled with the Bovie.  The final polyethylene marathon bearing was then inserted, reduced without difficulty, and again through a full range of motion, there was no malrotation of the tibial tray.  No opening with varus or valgus stress and thought we had a very nice realignment of the leg.  Hemovac was inserted.  We did inject 0.25% Marcaine with epinephrine about the edges.  The deep capsule was  closed with a running 0 Ethibond, superficial capsule with a running 0 Vicryl, 2-0 Vicryl, the subcu with 3-0 Monocryl, and skin closed with skin clips. Sterile bulky dressing was applied followed by the patient's support stocking.  The patient tolerated the procedure well without complications.     Vonna Kotyk. Durward Fortes, M.D.     PWW/MEDQ  D:  12/06/2015  T:  12/06/2015  Job:  [id number]

## 2015-12-06 NOTE — Anesthesia Procedure Notes (Addendum)
Anesthesia Regional Block:  Femoral nerve block  Pre-Anesthetic Checklist: ,, timeout performed, Correct Patient, Correct Site, Correct Laterality, Correct Procedure,, site marked, risks and benefits discussed, Surgical consent,  Pre-op evaluation,  At surgeon's request and post-op pain management  Laterality: Left  Prep: chloraprep       Needles:  Injection technique: Single-shot  Needle Type: Echogenic Stimulator Needle     Needle Length: 9cm 9 cm Needle Gauge: 21 and 21 G    Additional Needles:  Procedures: nerve stimulator Femoral nerve block  Nerve Stimulator or Paresthesia:  Response: Quadriceps muscle contraction, 0.45 mA,   Additional Responses:   Narrative:  Start time: 12/06/2015 9:23 AM End time: 12/06/2015 9:33 AM Injection made incrementally with aspirations every 5 mL.  Performed by: Personally  Anesthesiologist: Bhavya Grand  Additional Notes: Functioning IV was confirmed and monitors were applied.  A 50mm 21ga Arrow echogenic stimulator needle was used. Sterile prep and drape,hand hygiene and sterile gloves were used.  Negative aspiration and negative test dose prior to incremental administration of local anesthetic. The patient tolerated the procedure well.     Spinal Patient location during procedure: OR Start time: 12/06/2015 10:21 AM End time: 12/06/2015 10:28 AM Staffing Anesthesiologist: Tyee Vandevoorde Performed by: anesthesiologist  Preanesthetic Checklist Completed: patient identified, site marked, surgical consent, pre-op evaluation, timeout performed, IV checked, risks and benefits discussed and monitors and equipment checked Spinal Block Patient position: sitting Prep: Betadine and site prepped and draped Patient monitoring: heart rate, cardiac monitor, continuous pulse ox and blood pressure Approach: midline Location: L3-4 Injection technique: single-shot Needle Needle type: Pencan  Needle gauge: 25 G Needle length: 9  cm Assessment Sensory level: T8 Additional Notes Pt tolerated the procedure well.

## 2015-12-06 NOTE — Progress Notes (Signed)
Small area  noted to right lower leg no drainage noted, area is red in color, pt states she hit her leg and caused a spot to come up. Red area also noted to right outer foot, states that is where her shoes rubbed it, no drainage noted.

## 2015-12-06 NOTE — H&P (Signed)
  The recent History & Physical has been reviewed. I have personally examined the patient today. There is no interval change to the documented History & Physical. The patient would like to proceed with the procedure.  [name] W 12/06/2015,  9:38 AM

## 2015-12-06 NOTE — Op Note (Signed)
PATIENT ID:      Donna Woods  MRN:     [id number] DOB/AGE:    March 03, 1934 / 82 y.o.       OPERATIVE REPORT    DATE OF PROCEDURE:  12/06/2015       PREOPERATIVE DIAGNOSIS:END STAGE, PRIMARY   LEFT KNEE OSTEOARTHRITIS                                                       Estimated body mass index is 24.46 kg/(m^2) as calculated from the following:   Height as of this encounter: 5\' 5"  (1.651 m).   Weight as of this encounter: 66.679 kg (147 lb).     POSTOPERATIVE DIAGNOSIS:   same                                                                      Estimated body mass index is 24.46 kg/(m^2) as calculated from the following:   Height as of this encounter: 5\' 5"  (1.651 m).   Weight as of this encounter: 66.679 kg (147 lb).     PROCEDURE:  Procedure(s):LEFT TOTAL KNEE ARTHROPLASTY      SURGEON:  [name], MD    ASSISTANT:   Biagio Borg, PA-C   (Present and scrubbed throughout the case, critical for assistance with exposure, retraction, instrumentation, and closure.)          ANESTHESIA: spinal and IV sedation     DRAINS: (LEFT KNEE) Hemovact drain(s) in the CLAMPED with  Suction Clamped :      TOURNIQUET TIME:  Total Tourniquet Time Documented: Thigh (Left) - 73 minutes Total: Thigh (Left) - 73 minutes     COMPLICATIONS:  None   CONDITION:  stable  PROCEDURE IN DETAIL: [place], [name] 12/06/2015, 12:21 PM

## 2015-12-06 NOTE — Progress Notes (Signed)
Utilization review completed.  

## 2015-12-06 NOTE — Progress Notes (Signed)
Orthopedic Tech Progress Note Patient Details:  Donna Woods Nov 30, 1933 [id number]  CPM Left Knee CPM Left Knee: On Left Knee Flexion (Degrees): 90 Left Knee Extension (Degrees): 0   Karolee Stamps 12/06/2015, 5:39 PM

## 2015-12-06 NOTE — Anesthesia Preprocedure Evaluation (Signed)
Anesthesia Evaluation  Patient identified by MRN, date of birth, ID band Patient awake    Reviewed: Allergy & Precautions, NPO status , Patient's Chart, lab work & pertinent test results  Airway Mallampati: II   Neck ROM: full    Dental   Pulmonary neg pulmonary ROS,    breath sounds clear to auscultation       Cardiovascular hypertension,  Rhythm:regular Rate:Normal     Neuro/Psych  Neuromuscular disease    GI/Hepatic GERD  ,  Endo/Other    Renal/GU      Musculoskeletal  (+) Arthritis , Fibromyalgia -  Abdominal   Peds  Hematology   Anesthesia Other Findings   Reproductive/Obstetrics                             Anesthesia Physical Anesthesia Plan  ASA: II  Anesthesia Plan: MAC and Spinal   Post-op Pain Management:    Induction: Intravenous  Airway Management Planned: Simple Face Mask  Additional Equipment:   Intra-op Plan:   Post-operative Plan:   Informed Consent: I have reviewed the patients History and Physical, chart, labs and discussed the procedure including the risks, benefits and alternatives for the proposed anesthesia with the patient or authorized representative who has indicated his/her understanding and acceptance.     Plan Discussed with: CRNA, Anesthesiologist and Surgeon  Anesthesia Plan Comments:         Anesthesia Quick Evaluation

## 2015-12-06 NOTE — Transfer of Care (Signed)
Immediate Anesthesia Transfer of Care Note  Patient: Donna Woods  Procedure(s) Performed: Procedure(s): TOTAL KNEE ARTHROPLASTY (Left)  Patient Location: PACU  Anesthesia Type:MAC and Spinal  Level of Consciousness: awake and oriented  Airway & Oxygen Therapy: Patient Spontanous Breathing and Patient connected to face mask oxygen  Post-op Assessment: Report given to RN, Post -op Vital signs reviewed and stable and Patient moving all extremities X 4  Post vital signs: Reviewed and stable  Last Vitals:  Filed Vitals:   12/06/15 0934 12/06/15 0935  BP:    Pulse: 44 46  Temp:    Resp: 12 13    Complications: No apparent anesthesia complications

## 2015-12-07 ENCOUNTER — Encounter (HOSPITAL_COMMUNITY): Payer: Self-pay | Admitting: Orthopaedic Surgery

## 2015-12-07 LAB — TYPE AND SCREEN
ABO/RH(D): A POS
Antibody Screen: POSITIVE
DAT, IGG: NEGATIVE
Donor AG Type: NEGATIVE
Donor AG Type: NEGATIVE
PT AG TYPE: NEGATIVE
UNIT DIVISION: 0
UNIT DIVISION: 0

## 2015-12-07 LAB — BASIC METABOLIC PANEL
ANION GAP: 10 (ref 5–15)
BUN: 24 mg/dL — ABNORMAL HIGH (ref 6–20)
CALCIUM: 8.2 mg/dL — AB (ref 8.9–10.3)
CO2: 24 mmol/L (ref 22–32)
CREATININE: 1.21 mg/dL — AB (ref 0.44–1.00)
Chloride: 102 mmol/L (ref 101–111)
GFR, EST AFRICAN AMERICAN: 48 mL/min — AB (ref >60)
GFR, EST NON AFRICAN AMERICAN: 41 mL/min — AB (ref >60)
GLUCOSE: 124 mg/dL — AB (ref 65–99)
Potassium: 3 mmol/L — ABNORMAL LOW (ref 3.5–5.1)
Sodium: 136 mmol/L (ref 135–145)

## 2015-12-07 LAB — CBC
HEMATOCRIT: 31.3 % — AB (ref 36.0–46.0)
Hemoglobin: 10.4 g/dL — ABNORMAL LOW (ref 12.0–15.0)
MCH: 32.2 pg (ref 26.0–34.0)
MCHC: 33.2 g/dL (ref 30.0–36.0)
MCV: 96.9 fL (ref 78.0–100.0)
PLATELETS: 151 10*3/uL (ref 150–400)
RBC: 3.23 MIL/uL — ABNORMAL LOW (ref 3.87–5.11)
RDW: 13.1 % (ref 11.5–15.5)
WBC: 9.1 10*3/uL (ref 4.0–10.5)

## 2015-12-07 MED ORDER — METOCLOPRAMIDE HCL 5 MG/ML IJ SOLN: 5.0000 mg | Freq: Three times a day (TID) | INTRAMUSCULAR | Status: DC

## 2015-12-07 MED ORDER — METOCLOPRAMIDE HCL 5 MG PO TABS
5.0000 mg | ORAL_TABLET | Freq: Three times a day (TID) | ORAL | Status: DC
  Administered 2015-12-07: 10 mg via ORAL
  Administered 2015-12-07: 5 mg via ORAL
  Administered 2015-12-08 – 2015-12-09 (×4): 10 mg via ORAL
  Filled 2015-12-07 (×4): qty 2
  Filled 2015-12-07: qty 1
  Filled 2015-12-07: qty 2

## 2015-12-07 MED ORDER — SODIUM CHLORIDE 0.9 % IV SOLN
INTRAVENOUS | Status: DC
  Administered 2015-12-07: 10:00:00 via INTRAVENOUS

## 2015-12-07 MED ORDER — KETOROLAC TROMETHAMINE 30 MG/ML IJ SOLN
INTRAMUSCULAR | Status: AC
  Administered 2015-12-07: 7.5 mg
  Filled 2015-12-07: qty 1

## 2015-12-07 NOTE — NC FL2 (Signed)
[place] LEVEL OF CARE SCREENING TOOL     IDENTIFICATION  Patient Name: Donna Woods Birthdate: May 16, 1934 Sex: female Admission Date (Current Location): 12/06/2015  [facility] and [place] Number:  [employer] and Address:  The . [facility], [place] [address], [place], [place] [address]      Provider Number: [id number]  Attending Physician Name and Address:  Garald Balding, MD  Relative Name and Phone Number:  Sitlaly Panzer patient's husband [phone] or [phone]    Current Level of Care: Hospital Recommended Level of Care: [place] Prior Approval Number:    Date Approved/Denied:   PASRR Number: [id number]  Discharge Plan: SNF    Current Diagnoses: Patient Active Problem List   Diagnosis Date Noted  . Primary osteoarthritis of left knee 12/06/2015  . S/P total knee replacement using cement 12/06/2015  . Impaired glucose tolerance 11/25/2015  . Osteoarthritis of right knee 11/25/2015  . Venous stasis 11/25/2015  . Stasis dermatitis 11/25/2015  . Bradycardia 10/11/2015  . Short-term memory loss 01/16/2015  . PVC's (premature ventricular contractions) 06/25/2013  . Hernia, inguinal, left 09/30/2012  . Hypertension 08/26/2011  . Allergic rhinitis 08/26/2011  . Osteoporosis 08/26/2011  . Fibromyalgia 08/26/2011  . DEMENTIA 03/20/2010  . GERD 01/25/2010  . DEGENERATIVE JOINT DISEASE 01/25/2010    Orientation RESPIRATION BLADDER Height & Weight    Time, Situation, Place, Self  Normal Continent 5\' 5"  (165.1 cm) 147 lbs.  BEHAVIORAL SYMPTOMS/MOOD NEUROLOGICAL BOWEL NUTRITION STATUS      Continent Diet (Regular )  AMBULATORY STATUS COMMUNICATION OF NEEDS Skin   Limited Assist Verbally Surgical wounds                       Personal Care Assistance Level of Assistance  Bathing, Dressing Bathing Assistance: Limited assistance   Dressing Assistance: Limited assistance     Functional  Limitations Info             SPECIAL CARE FACTORS FREQUENCY  PT (By licensed PT)     PT Frequency: 7x a week              Contractures      Additional Factors Info  Allergies, Code Status Code Status Info: Full Code Allergies Info: ADHESIVE, BENICAR, PENICILLINS           Current Medications (12/07/2015):  This is the current hospital active medication list Current Facility-Administered Medications  Medication Dose Route Frequency Provider Last Rate Last Dose  . 0.9 %  sodium chloride infusion   Intravenous Continuous Cherylann Ratel, PA-C 75 mL/hr at 12/07/15 1003    . acetaminophen (OFIRMEV) IV 1,000 mg  1,000 mg Intravenous 4 times per day Cherylann Ratel, PA-C   1,000 mg at 12/07/15 [id number]  . albuterol (PROVENTIL) (2.5 MG/3ML) 0.083% nebulizer solution 2.5 mg  2.5 mg Inhalation Q6H PRN Cherylann Ratel, PA-C      . alum & mag hydroxide-simeth (MAALOX/MYLANTA) 200-200-20 MG/5ML suspension 30 mL  30 mL Oral Q4H PRN Mike Craze Petrarca, PA-C   30 mL at 12/07/15 0017  . atenolol (TENORMIN) tablet 12.5 mg  12.5 mg Oral Daily Mike Craze Petrarca, PA-C   12.5 mg at 12/07/15 1002  . bisacodyl (DULCOLAX) suppository 10 mg  10 mg Rectal Daily PRN Cherylann Ratel, PA-C      . diphenhydrAMINE (BENADRYL) 12.5 MG/5ML elixir 12.5-25 mg  12.5-25 mg Oral Q4H PRN Cherylann Ratel, PA-C      .  docusate sodium (COLACE) capsule 100 mg  100 mg Oral BID Mike Craze Petrarca, PA-C   100 mg at 12/07/15 1002  . HYDROmorphone (DILAUDID) injection 0.5 mg  0.5 mg Intravenous Q2H PRN Cherylann Ratel, PA-C   0.5 mg at 12/06/15 2025  . loratadine (CLARITIN) tablet 10 mg  10 mg Oral Daily Cherylann Ratel, PA-C   10 mg at 12/07/15 1002  . magnesium citrate solution 1 Bottle  1 Bottle Oral Once PRN Cherylann Ratel, PA-C      . menthol-cetylpyridinium (CEPACOL) lozenge 3 mg  1 lozenge Oral PRN Cherylann Ratel, PA-C       Or  . phenol (CHLORASEPTIC) mouth spray 1 spray  1 spray Mouth/Throat PRN Cherylann Ratel, PA-C      . methocarbamol (ROBAXIN) tablet 500 mg  500 mg Oral Q6H PRN Cherylann Ratel, PA-C       Or  . methocarbamol (ROBAXIN) 500 mg in dextrose 5 % 50 mL IVPB  500 mg Intravenous Q6H PRN Cherylann Ratel, PA-C      . metoCLOPramide (REGLAN) injection 5-10 mg  5-10 mg Intravenous 3 times per day Cherylann Ratel, PA-C       Or  . metoCLOPramide (REGLAN) tablet 5-10 mg  5-10 mg Oral 3 times per day Cherylann Ratel, PA-C   10 mg at 12/07/15 1009  . ondansetron (ZOFRAN) tablet 4 mg  4 mg Oral Q6H PRN Cherylann Ratel, PA-C       Or  . ondansetron (ZOFRAN) injection 4 mg  4 mg Intravenous Q6H PRN Cherylann Ratel, PA-C   4 mg at 12/07/15 0754  . oxyCODONE (Oxy IR/ROXICODONE) immediate release tablet 5-10 mg  5-10 mg Oral Q3H PRN Mike Craze Petrarca, PA-C   10 mg at 12/07/15 1316  . pantoprazole (PROTONIX) EC tablet 40 mg  40 mg Oral Daily Cherylann Ratel, PA-C   40 mg at 12/07/15 1002  . polyethylene glycol (MIRALAX / GLYCOLAX) packet 17 g  17 g Oral Daily PRN Mike Craze Petrarca, PA-C      . potassium chloride SA (K-DUR,KLOR-CON) CR tablet 10 mEq  10 mEq Oral Daily Mike Craze Petrarca, PA-C   10 mEq at 12/07/15 1003  . rivaroxaban (XARELTO) tablet 10 mg  10 mg Oral Q breakfast Cherylann Ratel, PA-C   10 mg at 12/07/15 0754  . triamterene-hydrochlorothiazide (MAXZIDE-25) 37.5-25 MG per tablet 0.5 tablet  0.5 tablet Oral Daily Cherylann Ratel, PA-C   0.5 tablet at 12/06/15 2201     Discharge Medications: Please see discharge summary for a list of discharge medications.  Relevant Imaging Results:  Relevant Lab Results:   Additional Information    Jannine Abreu, Jones Broom, LCSWA

## 2015-12-07 NOTE — Clinical Social Work Placement (Addendum)
   CLINICAL SOCIAL WORK PLACEMENT  NOTE  Date:  12/07/2015  Patient Details  Name: Donna Woods MRN: [id number] Date of Birth: 04-13-1934  Clinical Social Work is seeking post-discharge placement for this patient at the [place] level of care (*CSW will initial, date and re-position this form in  chart as items are completed):  Yes   Patient/family provided with [facility] Work Department's list of facilities offering this level of care within the geographic area requested by the patient (or if unable, by the patient's family).  Yes   Patient/family informed of their freedom to choose among providers that offer the needed level of care, that participate in Medicare, Medicaid or managed care program needed by the patient, have an available bed and are willing to accept the patient.  Yes   Patient/family informed of [facility]'s ownership interest in [facility] and [facility], as well as of the fact that they are under no obligation to receive care at these facilities.  PASRR submitted to EDS on 12/07/15     PASRR number received on       Existing PASRR number confirmed on 12/07/15     FL2 transmitted to all facilities in geographic area requested by pt/family on 12/07/15     FL2 transmitted to all facilities within larger geographic area on 12/07/15     Patient informed that his/her managed care company has contracts with or will negotiate with certain facilities, including the following:         12/07/15   Patient/family informed of bed offers received.  Patient [facility] bed at  [facility]     Physician recommends and patient chooses bed at      Patient to be transferred to  [facility] on  12/09/15.  Patient to be transferred to facility by  [facility]     Patient family notified on  12/09/15 of transfer.  Name of family member notified:   Lulamae Thielbar, patient's husband     PHYSICIAN Please sign FL2     Additional  Comment:    _______________________________________________ Ross Ludwig, LCSWA 12/07/2015, 5:59 PM

## 2015-12-07 NOTE — Evaluation (Signed)
Physical Therapy Evaluation Patient Details Name: Donna Woods MRN: [id number] DOB: 12-28-1933 Today's Date: 12/07/2015   History of Present Illness  81 y.o. female now s/p Lt TKA (cemented). PMH: Rt TKA, hypertension, bradycardia, memory changes  Clinical Impression  Pt is s/p TKA resulting in the deficits listed below (see PT Problem List).  Pt will benefit from skilled PT to increase their independence and safety with mobility to allow discharge to the venue listed below. Patient able to ambulate 10 feet with rw and min guard assist. Based upon the patient's current mobility level, recommend SNF upon D/C. Patient is in agreement. PT to continue to follow and progress as tolerated.      Follow Up Recommendations SNF;Supervision/Assistance - 24 hour    Equipment Recommendations  Other (comment) (to be addressed at next care venue.)    Recommendations for Other Services       Precautions / Restrictions Precautions Precautions: Knee;Fall Precaution Booklet Issued: Yes (comment) Precaution Comments: HEP provided, reviewed no pillow under knee Restrictions Weight Bearing Restrictions: Yes LLE Weight Bearing: Partial weight bearing LLE Partial Weight Bearing Percentage or Pounds: 50      Mobility  Bed Mobility Overal bed mobility: Needs Assistance Bed Mobility: Supine to Sit     Supine to sit: Min assist;HOB elevated     General bed mobility comments: assist with LLE  Transfers Overall transfer level: Needs assistance Equipment used: Rolling walker (2 wheeled) Transfers: Sit to/from Stand Sit to Stand: Min assist         General transfer comment: cues for safety with sitting  Ambulation/Gait Ambulation/Gait assistance: Min guard Ambulation Distance (Feet): 10 Feet Assistive device: Rolling walker (2 wheeled) Gait Pattern/deviations: Step-to pattern Gait velocity: decreased   General Gait Details: Patient consistent with 50% WB, reminders provided  throughout to ensure consistency.  Stairs            Wheelchair Mobility    Modified Rankin (Stroke Patients Only)       Balance Overall balance assessment: Needs assistance Sitting-balance support: No upper extremity supported Sitting balance-Leahy Scale: Fair     Standing balance support: Bilateral upper extremity supported Standing balance-Leahy Scale: Poor Standing balance comment: using rw                             Pertinent Vitals/Pain Pain Assessment: 0-10 Pain Score: 0-No pain (while in bed at rest) Pain Intervention(s): Monitored during session    Home Living Family/patient expects to be discharged to:: Skilled nursing facility Living Arrangements: Spouse/significant other   Type of Home: House Home Access: Stairs to enter Entrance Stairs-Rails: Right Entrance Stairs-Number of Steps: 4-5 Home Layout: One level Home Equipment: Walker - 2 wheels      Prior Function Level of Independence: Independent               Hand Dominance        Extremity/Trunk Assessment   Upper Extremity Assessment: Overall WFL for tasks assessed           Lower Extremity Assessment: LLE deficits/detail   LLE Deficits / Details: unable to perform SLR independently     Communication   Communication: No difficulties  Cognition Arousal/Alertness: Awake/alert Behavior During Therapy: WFL for tasks assessed/performed Overall Cognitive Status: History of cognitive impairments - at baseline                      General Comments General comments (  skin integrity, edema, etc.): Patient with mild confusion during session, occasional difficult with task and mild confusion with subjective description of living situation    Exercises Total Joint Exercises Ankle Circles/Pumps: AROM;Both;10 reps Quad Sets: Strengthening;Left;10 reps Heel Slides: AAROM;Left;10 reps Goniometric ROM: 92 degrees flexion      Assessment/Plan    PT Assessment  Patient needs continued PT services  PT Diagnosis Difficulty walking;Generalized weakness   PT Problem List Decreased range of motion;Decreased strength;Decreased activity tolerance;Decreased balance;Decreased mobility;Decreased knowledge of use of DME;Decreased safety awareness  PT Treatment Interventions DME instruction;Gait training;Stair training;Functional mobility training;Therapeutic activities;Therapeutic exercise;Balance training;Patient/family education   PT Goals (Current goals can be found in the Care Plan section) Acute Rehab PT Goals Patient Stated Goal: go for more rehab before going home PT Goal Formulation: With patient Time For Goal Achievement: 12/21/15 Potential to Achieve Goals: Good    Frequency 7X/week   Barriers to discharge        Co-evaluation               End of Session Equipment Utilized During Treatment: Gait belt Activity Tolerance: Patient tolerated treatment well Patient left: in chair;with call bell/phone within reach;with chair alarm set;with family/visitor present;Other (comment) (cold pack on Lt knee) Nurse Communication: Mobility status;Precautions;Weight bearing status         Time: [id number] PT Time Calculation (min) (ACUTE ONLY): 36 min   Charges:   PT Evaluation $PT Eval Moderate Complexity: 1 Procedure PT Treatments $Gait Training: 8-22 mins   PT G Codes:        Cassell Clement, PT, CSCS Pager [phone] Office [phone]  12/07/2015, 9:13 AM

## 2015-12-07 NOTE — Progress Notes (Signed)
Physical Therapy Treatment Patient Details Name: Donna Woods MRN: [id number] DOB: 1934/08/29 Today's Date: 12/07/2015    History of Present Illness 82 y.o. female now s/p Lt TKA (cemented). PMH: Rt TKA, hypertension, bradycardia, memory changes    PT Comments    Patient with decreased mobility during afternoon session with ambulation and increased assistance needed with sit/stand transfers. Patient with Lt knee buckling with initial stand from bed and returned to sitting. Ambulation also limited due to patient's instability. PT to continue to follow and recommending SNF at D/C.   Follow Up Recommendations  SNF;Supervision/Assistance - 24 hour     Equipment Recommendations  None recommended by PT    Recommendations for Other Services       Precautions / Restrictions Precautions Precautions: Knee;Fall Restrictions Weight Bearing Restrictions: Yes LLE Weight Bearing: Partial weight bearing LLE Partial Weight Bearing Percentage or Pounds: 50    Mobility  Bed Mobility Overal bed mobility: Needs Assistance Bed Mobility: Supine to Sit     Supine to sit: Min assist        Transfers Overall transfer level: Needs assistance Equipment used: Rolling walker (2 wheeled) Transfers: Sit to/from Stand Sit to Stand: Mod assist         General transfer comment: cues for hand position, knee buckling and patient losing balance onto bed with initial sit to stand. Improved stability with second attempt.   Ambulation/Gait Ambulation/Gait assistance: Min assist Ambulation Distance (Feet): 5 Feet Assistive device: Rolling walker (2 wheeled) Gait Pattern/deviations: Step-to pattern Gait velocity: decreased   General Gait Details: Reminder for 50% weightbearing during ambulation. Patient attempting to bear weight through UEs.    Stairs            Wheelchair Mobility    Modified Rankin (Stroke Patients Only)       Balance Overall balance assessment: Needs  assistance Sitting-balance support: No upper extremity supported Sitting balance-Leahy Scale: Fair     Standing balance support: Bilateral upper extremity supported Standing balance-Leahy Scale: Poor Standing balance comment: needing use of rw                    Cognition Arousal/Alertness: Awake/alert Behavior During Therapy: WFL for tasks assessed/performed Overall Cognitive Status: History of cognitive impairments - at baseline                      Exercises Total Joint Exercises Ankle Circles/Pumps: AROM;Both;10 reps Quad Sets: Strengthening;Left;10 reps Short Arc Quad: Strengthening;Left;10 reps (attempting, unable to perform correctly ) Short Arc Quad Limitations: physical and verbal cues needed for correct technique. Patient unable to perform exercise correctly by end of assisted repetitions.  Heel Slides: AAROM;Left;10 reps Straight Leg Raises: Strengthening;Left;10 reps;Other (comment) (lag, cues to keep knee straight)    General Comments        Pertinent Vitals/Pain Pain Assessment: Faces Faces Pain Scale: Hurts a little bit Pain Location: knee Lt Pain Descriptors / Indicators: Other (Comment) (reports that it "hurts a little". ) Pain Intervention(s): Limited activity within patient's tolerance;Monitored during session    Home Living                      Prior Function            PT Goals (current goals can now be found in the care plan section) Acute Rehab PT Goals Patient Stated Goal: go for more rehab before going home PT Goal Formulation: With patient Time For Goal Achievement:  12/21/15 Potential to Achieve Goals: Good Progress towards PT goals:  (patient with decreased mobility with afternoon tx. )    Frequency  7X/week    PT Plan Current plan remains appropriate    Co-evaluation             End of Session Equipment Utilized During Treatment: Gait belt Activity Tolerance: Patient tolerated treatment well Patient  left: in chair;with call bell/phone within reach;with chair alarm set;with family/visitor present     Time: [id number] PT Time Calculation (min) (ACUTE ONLY): 29 min  Charges:  $Gait Training: 8-22 mins $Therapeutic Exercise: 8-22 mins                    G Codes:      Cassell Clement, PT, CSCS Pager [phone] Office [phone]  12/07/2015, 3:50 PM

## 2015-12-07 NOTE — Progress Notes (Signed)
Patient ID: Donna Woods, female   DOB: Aug 23, 1934, 82 y.o.   MRN: [id number] PATIENT ID: Donna Woods        MRN:  [id number]          DOB/AGE: 1934/11/19 / 82 y.o.    Donna Fears, MD   Biagio Borg, PA-C [address] [place], [place]  [address]                             [phone]   PROGRESS NOTE  Subjective:  negative for Chest Pain  negative for Shortness of Breath  positive for Nausea/Vomiting   negative for Calf Pain    Tolerating Diet: has not tried eating yet.  C/O some nausea without emesis         Patient reports pain as mild and moderate.     States she is confused about last night  Objective: Vital signs in last 24 hours:   Patient Vitals for the past 24 hrs:  BP Temp Temp src Pulse Resp SpO2 Height Weight  12/07/15 0152 (!) 101/49 mmHg 97.6 F (36.4 C) Oral (!) 49 15 97 % - -  12/06/15 2102 (!) 110/56 mmHg 97.8 F (36.6 C) Oral (!) 47 15 98 % - -  12/06/15 1525 116/63 mmHg 97.1 F (36.2 C) Oral (!) 46 16 98 % - -  12/06/15 1511 - 97.4 F (36.3 C) - (!) 48 11 94 % - -  12/06/15 1500 117/63 mmHg - - (!) 54 18 100 % - -  12/06/15 1445 93/77 mmHg - - (!) 48 11 100 % - -  12/06/15 1430 (!) 110/58 mmHg 97.4 F (36.3 C) - (!) 51 13 98 % - -  12/06/15 1415 (!) 110/51 mmHg - - (!) 49 15 99 % - -  12/06/15 1400 (!) 102/49 mmHg (!) 96.2 F (35.7 C) - (!) 47 12 99 % - -  12/06/15 1345 (!) 90/47 mmHg (!) 96.4 F (35.8 C) - (!) 50 14 100 % - -  12/06/15 1330 101/75 mmHg (!) 96.2 F (35.7 C) - (!) 50 17 99 % - -  12/06/15 1315 (!) 108/52 mmHg - - (!) 52 14 100 % - -  12/06/15 1300 (!) 98/55 mmHg (!) 96 F (35.6 C) - (!) 51 11 100 % - -  12/06/15 1240 103/60 mmHg (!) 96 F (35.6 C) - 66 13 99 % - -  12/06/15 0935 - - - (!) 46 13 100 % - -  12/06/15 0934 - - - (!) 44 12 100 % - -  12/06/15 0933 - - - (!) 48 14 100 % - -  12/06/15 0932 - - - (!) 44 (!) 9 100 % - -  12/06/15 0931 - - - (!) 44 (!) 9 100 % - -  12/06/15 0930 - - - (!) 44 12 100 %  - -  12/06/15 0929 (!) 134/55 mmHg - - (!) 44 (!) 9 100 % - -  12/06/15 0928 - - - (!) 45 13 100 % - -  12/06/15 0927 - - - (!) 45 15 100 % - -  12/06/15 0926 - - - (!) 44 12 100 % - -  12/06/15 0925 - - - (!) 46 11 100 % - -  12/06/15 0924 (!) 141/96 mmHg - - (!) 44 11 100 % - -  12/06/15 0923 - - - (!) 45 11 100 % - -  12/06/15 0922 - - - (!) 44 14 100 % - -  12/06/15 0921 - - - (!) 44 17 100 % - -  12/06/15 0920 - - - (!) 44 15 100 % - -  12/06/15 0919 (!) 133/103 mmHg - - (!) 43 16 100 % - -  12/06/15 0918 - - - (!) 111 12 95 % - -  12/06/15 0846 139/63 mmHg 98.2 F (36.8 C) Oral (!) 41 20 100 % - -  12/06/15 0826 - - - - - - 5\' 5"  (1.651 m) 66.679 kg (147 lb)      Intake/Output from previous day:   01/10 0701 - 01/11 0700 In: 2240 [P.O.:240; I.V.:2000] Out: 1190 [Urine:890; Drains:250]   Intake/Output this shift:       Intake/Output      01/10 0701 - 01/11 0700 01/11 0701 - 01/12 0700   P.O. 240    I.V. (mL/kg) 2000 (30)    Total Intake(mL/kg) 2240 (33.6)    Urine (mL/kg/hr) 890    Drains 250    Blood 50    Total Output 1190     Net +1050             LABORATORY DATA: No results for input(s): WBC, HGB, HCT, PLT in the last 168 hours. No results for input(s): NA, K, CL, CO2, BUN, CREATININE, GLUCOSE, CALCIUM in the last 168 hours. Lab Results  Component Value Date   INR 1.10 11/22/2015   INR 1.15 11/28/2009   INR 1.00 11/22/2009    Recent Radiographic Studies :  Dg Chest 2 View  11/22/2015  CLINICAL DATA:  Preop testing EXAM: CHEST  2 VIEW COMPARISON:  12/04/2009 FINDINGS: Heart is upper limits normal in size. Lungs are clear. Eventration of the right hemidiaphragm. No effusions. No acute bony abnormality. IMPRESSION: No active cardiopulmonary disease. Electronically Signed   By: Rolm Baptise M.D.   On: 11/22/2015 12:13     Examination:  General appearance: alert, cooperative and mild distress Resp: normal percussion bilaterally Cardio: regular rate and  rhythm bud bradycardic GI: normal findings: bowel sounds present but scant  Wound Exam: clean, dry, intact   Drainage:  None: wound tissue dry  Motor Exam: EHL, FHL, Anterior Tibial and Posterior Tibial Intact  Sensory Exam: Superficial Peroneal, Deep Peroneal and Tibial normal  Vascular Exam: Left posterior tibial artery has trace pulse  Assessment:    1 Day Post-Op  Procedure(s) (LRB): TOTAL KNEE ARTHROPLASTY (Left)  ADDITIONAL DIAGNOSIS:  Principal Problem:   Primary osteoarthritis of left knee Active Problems:   S/P total knee replacement using cement    Plan: Physical Therapy as ordered PWB  DVT Prophylaxis:  Xarelto, Foot Pumps and TED hose  DISCHARGE PLAN: Skilled Nursing Facility/Rehab  DISCHARGE NEEDS: CPM, Walker and 3-in-1 comode seat   Meds ordered for nausea.        [facility]  12/07/2015 7:34 AM

## 2015-12-07 NOTE — Clinical Social Work Note (Signed)
Clinical Social Work Assessment  Patient Details  Name: Donna Woods MRN: [id number] Date of Birth: 07-05-1934  Date of referral:  12/07/15               Reason for consult:  Facility Placement                Permission sought to share information with:  Facility Sport and exercise psychologist, Family Supports Permission granted to share information::  Yes, Verbal Permission Granted  Name::     Anastazja Stinnett patient's husband  Agency::  SNF admissions  Relationship::     Contact Information:     Housing/Transportation Living arrangements for the past 2 months:  [place] of Information:  Patient, Spouse Patient Interpreter Needed:  None Criminal Activity/Legal Involvement Pertinent to Current Situation/Hospitalization:  No - Comment as needed Significant Relationships:  Spouse, Adult Children Lives with:  Spouse Do you feel safe going back to the place where you live?  No (Patient and her family feel she needs some short term rehab first before she is able to return back home.) Need for family participation in patient care:  Yes (Comment) (Patient has some dementia)  Care giving concerns:  Patient and husband feel like she needs some short term rehab before she can return home.     Social Worker assessment / plan:  Patient is a 82 year old female who is married and lives with her husband.  Patient is alert and oriented x4, however sometimes patient does have some signs of dementia.  Patient's husband was at bedside and expressed that patient has been at [facility] in the past and they would like her to return.  Patient and her husband were explained the process and procedures for SNF placement.  Patient was explained what to expect while at SNF for the rehab.  Patient and her husband expressed they did not have any other questions or concerns.  Employment status:  Retired Forensic scientist:  Medicare PT Recommendations:  [place] /  Referral to community resources:  [place]  Patient/Family's Response to care:  Patient and her husband are in agreement to going to SNF for short term rehab.  Patient/Family's Understanding of and Emotional Response to Diagnosis, Current Treatment, and Prognosis:  Patient is aware of her current diagnosis and treatment plan.  Emotional Assessment Appearance:  Appears stated age Attitude/Demeanor/Rapport:    Affect (typically observed):  Appropriate, Pleasant, Calm Orientation:  Oriented to Self, Oriented to Place, Oriented to  Time, Oriented to Situation Alcohol / Substance use:  Not Applicable Psych involvement (Current and /or in the community):  No (Comment)  Discharge Needs  Concerns to be addressed:  No discharge needs identified Readmission within the last 30 days:  No Current discharge risk:  None Barriers to Discharge:  No Barriers Identified   Ross Ludwig, LCSWA 12/07/2015, 5:45 PM

## 2015-12-07 NOTE — Discharge Instructions (Signed)
Information on my medicine - XARELTO® (Rivaroxaban) ° °This medication education was reviewed with me or my healthcare representative as part of my discharge preparation.  The pharmacist that spoke with me during my hospital stay was:  Lashara Urey A Saharah Sherrow, RPH ° °Why was Xarelto® prescribed for you? °Xarelto® was prescribed for you to reduce the risk of blood clots forming after orthopedic surgery. The medical term for these abnormal blood clots is venous thromboembolism (VTE). ° °What do you need to know about xarelto® ? °Take your Xarelto® ONCE DAILY at the same time every day. °You may take it either with or without food. ° °If you have difficulty swallowing the tablet whole, you may crush it and mix in applesauce just prior to taking your dose. ° °Take Xarelto® exactly as prescribed by your doctor and DO NOT stop taking Xarelto® without talking to the doctor who prescribed the medication.  Stopping without other VTE prevention medication to take the place of Xarelto® may increase your risk of developing a clot. ° °After discharge, you should have regular check-up appointments with your healthcare provider that is prescribing your Xarelto®.   ° °What do you do if you miss a dose? °If you miss a dose, take it as soon as you remember on the same day then continue your regularly scheduled once daily regimen the next day. Do not take two doses of Xarelto® on the same day.  ° °Important Safety Information °A possible side effect of Xarelto® is bleeding. You should call your healthcare provider right away if you experience any of the following: °? Bleeding from an injury or your nose that does not stop. °? Unusual colored urine (red or dark brown) or unusual colored stools (red or black). °? Unusual bruising for unknown reasons. °? A serious fall or if you hit your head (even if there is no bleeding). ° °Some medicines may interact with Xarelto® and might increase your risk of bleeding while on Xarelto®. To help avoid  this, consult your healthcare provider or pharmacist prior to using any new prescription or non-prescription medications, including herbals, vitamins, non-steroidal anti-inflammatory drugs (NSAIDs) and supplements. ° °This website has more information on Xarelto®: www.xarelto.com. ° ° ° °

## 2015-12-07 NOTE — Anesthesia Postprocedure Evaluation (Signed)
Anesthesia Post Note  Patient: Donna Woods  Procedure(s) Performed: Procedure(s) (LRB): TOTAL KNEE ARTHROPLASTY (Left)  Patient location during evaluation: PACU Anesthesia Type: Spinal Level of consciousness: awake and alert and oriented Pain management: pain level controlled Vital Signs Assessment: post-procedure vital signs reviewed and stable Respiratory status: spontaneous breathing Cardiovascular status: stable Postop Assessment: no headache Anesthetic complications: no    Last Vitals:  Filed Vitals:   12/06/15 2102 12/07/15 0152  BP: 110/56 101/49  Pulse: 47 49  Temp: 36.6 C 36.4 C  Resp: 15 15    Last Pain:  Filed Vitals:   12/07/15 1119  PainSc: [place]

## 2015-12-07 NOTE — Clinical Social Work Note (Signed)
CSW received referral for SNF, CSW met with patient and her husband who was at bedside.  Patient is requesting to go to [facility] for short term rehab.  CSW to begin bed search process, patient's husband had durable power of attorney and living will paperwork, CSW made copies and put in patient's shadow chart.  CSW to continue to follow patient's progress throughout discharge planning.  Jones Broom. [place], MSW, [place] 12/07/2015 5:27 PM

## 2015-12-08 LAB — CBC
HCT: 32 % — ABNORMAL LOW (ref 36.0–46.0)
Hemoglobin: 10.8 g/dL — ABNORMAL LOW (ref 12.0–15.0)
MCH: 33.3 pg (ref 26.0–34.0)
MCHC: 33.8 g/dL (ref 30.0–36.0)
MCV: 98.8 fL (ref 78.0–100.0)
PLATELETS: 152 10*3/uL (ref 150–400)
RBC: 3.24 MIL/uL — AB (ref 3.87–5.11)
RDW: 13.2 % (ref 11.5–15.5)
WBC: 10.9 10*3/uL — AB (ref 4.0–10.5)

## 2015-12-08 LAB — BASIC METABOLIC PANEL
ANION GAP: 6 (ref 5–15)
BUN: 20 mg/dL (ref 6–20)
CALCIUM: 8.7 mg/dL — AB (ref 8.9–10.3)
CO2: 27 mmol/L (ref 22–32)
Chloride: 105 mmol/L (ref 101–111)
Creatinine, Ser: 1.29 mg/dL — ABNORMAL HIGH (ref 0.44–1.00)
GFR calc Af Amer: 44 mL/min — ABNORMAL LOW (ref >60)
GFR, EST NON AFRICAN AMERICAN: 38 mL/min — AB (ref >60)
Glucose, Bld: 108 mg/dL — ABNORMAL HIGH (ref 65–99)
POTASSIUM: 3.7 mmol/L (ref 3.5–5.1)
SODIUM: 138 mmol/L (ref 135–145)

## 2015-12-08 LAB — URINE CULTURE: Culture: >=100000

## 2015-12-08 MED ORDER — OXYCODONE HCL 5 MG PO TABS: 5.0000 mg | ORAL_TABLET | ORAL | Status: DC | PRN

## 2015-12-08 MED ORDER — RIVAROXABAN 10 MG PO TABS: 10.0000 mg | ORAL_TABLET | Freq: Every day | ORAL | Status: DC

## 2015-12-08 MED ORDER — METHOCARBAMOL 500 MG PO TABS: 500.0000 mg | ORAL_TABLET | Freq: Three times a day (TID) | ORAL | Status: DC | PRN

## 2015-12-08 NOTE — Progress Notes (Signed)
Patient ID: Donna Woods, female   DOB: 09-04-1934, 82 y.o.   MRN: [id number] PATIENT ID: Donna Woods        MRN:  [id number]          DOB/AGE: Mar 09, 1934 / 82 y.o.    [name], MD   Biagio Borg, PA-C [address] [place], [place]  [address]                             [phone]   PROGRESS NOTE  Subjective:  negative for Chest Pain  negative for Shortness of Breath  negative for Nausea/Vomiting-reolved from yesterday   negative for Calf Pain    Tolerating Diet: yes         Patient reports pain as mild.     OOB in chair without related problems  Objective: Vital signs in last 24 hours:   Patient Vitals for the past 24 hrs:  BP Temp Temp src Pulse Resp SpO2  12/08/15 0631 (!) 126/59 mmHg 98.3 F (36.8 C) Oral (!) 52 15 97 %  12/07/15 2053 (!) 122/56 mmHg 98.9 F (37.2 C) Oral (!) 51 16 97 %      Intake/Output from previous day:   01/11 0701 - 01/12 0700 In: 720 [P.O.:720] Out: 300 [Urine:150; Drains:150]   Intake/Output this shift:   01/12 0701 - 01/12 1900 In: 120 [P.O.:120] Out: 350 [Urine:350]   Intake/Output      01/11 0701 - 01/12 0700 01/12 0701 - 01/13 0700   P.O. 720 120   I.V. (mL/kg)     Total Intake(mL/kg) 720 (10.8) 120 (1.8)   Urine (mL/kg/hr) 150 (0.1) 350 (0.8)   Emesis/NG output 0 (0)    Drains 150 (0.1) 0 (0)   Blood     Total Output 300 350   Net +420 -230        Urine Occurrence 4 x    Emesis Occurrence 1 x       LABORATORY DATA:  Recent Labs  12/07/15 0716 12/08/15 0550  WBC 9.1 10.9*  HGB 10.4* 10.8*  HCT 31.3* 32.0*  PLT 151 152    Recent Labs  12/07/15 0716 12/08/15 0550  NA 136 138  K 3.0* 3.7  CL 102 105  CO2 24 27  BUN 24* 20  CREATININE 1.21* 1.29*  GLUCOSE 124* 108*  CALCIUM 8.2* 8.7*   Lab Results  Component Value Date   INR 1.10 11/22/2015   INR 1.15 11/28/2009   INR 1.00 11/22/2009    Recent Radiographic Studies :  Dg Chest 2 View  11/22/2015  CLINICAL DATA:  Preop  testing EXAM: CHEST  2 VIEW COMPARISON:  12/04/2009 FINDINGS: Heart is upper limits normal in size. Lungs are clear. Eventration of the right hemidiaphragm. No effusions. No acute bony abnormality. IMPRESSION: No active cardiopulmonary disease. Electronically Signed   By: Rolm Baptise M.D.   On: 11/22/2015 12:13     Examination:  General appearance: alert, cooperative and no distress  Wound Exam: clean, dry, intact   Drainage:  Scant/small amount Serosanguinous exudate  Motor Exam: EHL, FHL, Anterior Tibial and Posterior Tibial Intact  Sensory Exam: Superficial Peroneal, Deep Peroneal and Tibial normal  Vascular Exam: Normal  Assessment:    2 Days Post-Op  Procedure(s) (LRB): TOTAL KNEE ARTHROPLASTY (Left)  ADDITIONAL DIAGNOSIS:  Principal Problem:   Primary osteoarthritis of left knee Active Problems:   S/P total knee replacement using cement  Acute Blood Loss Anemia-asymptomatic   Plan: Physical Therapy as ordered Partial Weight Bearing @ 50% (PWB)  DVT Prophylaxis:  Xarelto, Foot Pumps and TED hose  DISCHARGE PLAN: Skilled Nursing Facility/Rehab  DISCHARGE NEEDS: HHPT, CPM, Walker and 3-in-1 comode seat hemovac removed and dressing changed, tolerating diet, OOB with PT, no calf pain- will plan on discharge tomorrow to rehab facility       [name] W  12/08/2015 1:23 PM

## 2015-12-08 NOTE — Clinical Social Work Note (Signed)
CSW spoke with [facility] admissions who said they can take patient on Friday after 12pm.  CSW to continue to follow patient's progress throughout discharge planning.  Jones Broom. [place], MSW, [place] 12/08/2015 3:48 PM

## 2015-12-08 NOTE — Progress Notes (Signed)
Orthopedic Tech Progress Note Patient Details:  Donna Woods 04/13/1934 [id number] On cpm at 1910 Patient ID: Donna Woods, female   DOB: [birth year], 82 y.o.   MRN: [id number]   Braulio Bosch 12/08/2015, 7:10 PM

## 2015-12-08 NOTE — Progress Notes (Signed)
Physical Therapy Treatment Patient Details Name: KENYANNA BIGFORD MRN: [id number] DOB: Dec 22, 1933 Today's Date: January 03, 2016    History of Present Illness 82 y.o. female now s/p Lt TKA (cemented). PMH: Rt TKA, hypertension, bradycardia, memory changes    PT Comments    Patient making gains with strengthening and mobility.  Pain limiting session this pm.  Follow Up Recommendations  SNF;Supervision/Assistance - 24 hour     Equipment Recommendations  None recommended by PT    Recommendations for Other Services       Precautions / Restrictions Precautions Precautions: Knee;Fall Restrictions Weight Bearing Restrictions: Yes LLE Weight Bearing: Partial weight bearing LLE Partial Weight Bearing Percentage or Pounds: 50    Mobility  Bed Mobility               General bed mobility comments: Patient declined mobility - just returned to bed and increased pain.  Transfers                    Ambulation/Gait                 Stairs            Wheelchair Mobility    Modified Rankin (Stroke Patients Only)       Balance                                    Cognition Arousal/Alertness: Awake/alert Behavior During Therapy: WFL for tasks assessed/performed Overall Cognitive Status: History of cognitive impairments - at baseline                      Exercises Total Joint Exercises Ankle Circles/Pumps: AROM;Both;10 reps;Supine Quad Sets: Strengthening;Left;10 reps;AROM;Supine Towel Squeeze: AROM;Both;10 reps;Supine Short Arc Quad: AROM;Left;5 reps;Supine Heel Slides: AAROM;Left;10 reps;Supine Hip ABduction/ADduction: AAROM;Left;10 reps;Supine Goniometric ROM: -15* to 75* flex (Difficult to measure flexion in supine and with increased pa)    General Comments        Pertinent Vitals/Pain Pain Assessment: 0-10 Pain Score: 8  Pain Location: Lt knee and hip Pain Descriptors / Indicators:  Aching;Discomfort;Shooting;Sore Pain Intervention(s): Limited activity within patient's tolerance;Monitored during session;Repositioned;Patient requesting pain meds-RN notified    Home Living                      Prior Function            PT Goals (current goals can now be found in the care plan section) Progress towards PT goals: Progressing toward goals    Frequency  7X/week    PT Plan Current plan remains appropriate    Co-evaluation             End of Session   Activity Tolerance: Patient limited by pain Patient left: in bed;with call bell/phone within reach;with family/visitor present     Time: 1845-1905 PT Time Calculation (min) (ACUTE ONLY): 20 min  Charges:  $Therapeutic Exercise: 8-22 mins                    G Codes:      Despina Pole 01-03-2016, 7:34 PM Carita Pian. Sanjuana Kava, [place] Pager [phone]

## 2015-12-08 NOTE — Care Management Note (Signed)
Case Management Note  Patient Details  Name: Donna Woods MRN: [id number] Date of Birth: 1934-07-13  Subjective/Objective:          S/p left total knee arthroplasty          Action/Plan: PT recommended SNF, referral made to CSW. Patient to discharge to SNF for rehab.   Expected Discharge Date:                  Expected Discharge Plan:  Skilled Nursing Facility  In-House Referral:  Clinical Social Work  Discharge planning Services  CM Consult  Post Acute Care Choice:    Choice offered to:     DME Arranged:    DME Agency:     HH Arranged:    [place] Agency:     Status of Service:  In process, will continue to follow  Medicare Important Message Given:    Date Medicare IM Given:    Medicare IM give by:    Date Additional Medicare IM Given:    Additional Medicare Important Message give by:     If discussed at [place] of Stay Meetings, dates discussed:    Additional Comments:  Nila Nephew, RN 12/08/2015, 2:04 PM

## 2015-12-08 NOTE — Progress Notes (Signed)
OT Cancellation Note  Patient Details Name: Donna Woods MRN: [id number] DOB: [birth year]   Cancelled Treatment:    Reason Eval/Treat Not Completed: Other (comment) Pt is Medicare and current D/C plan is SNF. No apparent immediate acute care OT needs, therefore will defer OT to SNF. If OT eval is needed please call Acute Rehab Dept. at [phone] or text page OT at [phone].    Almon Register [id number] 12/08/2015, 7:29 AM

## 2015-12-09 DIAGNOSIS — Z96652 Presence of left artificial knee joint: Secondary | ICD-10-CM | POA: Diagnosis not present

## 2015-12-09 DIAGNOSIS — I10 Essential (primary) hypertension: Secondary | ICD-10-CM | POA: Diagnosis not present

## 2015-12-09 DIAGNOSIS — M1712 Unilateral primary osteoarthritis, left knee: Secondary | ICD-10-CM | POA: Diagnosis not present

## 2015-12-09 DIAGNOSIS — R262 Difficulty in walking, not elsewhere classified: Secondary | ICD-10-CM | POA: Diagnosis not present

## 2015-12-09 DIAGNOSIS — D509 Iron deficiency anemia, unspecified: Secondary | ICD-10-CM | POA: Diagnosis not present

## 2015-12-09 DIAGNOSIS — I493 Ventricular premature depolarization: Secondary | ICD-10-CM | POA: Diagnosis not present

## 2015-12-09 DIAGNOSIS — D649 Anemia, unspecified: Secondary | ICD-10-CM | POA: Diagnosis not present

## 2015-12-09 DIAGNOSIS — R001 Bradycardia, unspecified: Secondary | ICD-10-CM | POA: Diagnosis not present

## 2015-12-09 DIAGNOSIS — M25569 Pain in unspecified knee: Secondary | ICD-10-CM | POA: Diagnosis not present

## 2015-12-09 DIAGNOSIS — K219 Gastro-esophageal reflux disease without esophagitis: Secondary | ICD-10-CM | POA: Diagnosis not present

## 2015-12-09 DIAGNOSIS — E539 Vitamin B deficiency, unspecified: Secondary | ICD-10-CM | POA: Diagnosis not present

## 2015-12-09 DIAGNOSIS — S50901A Unspecified superficial injury of right elbow, initial encounter: Secondary | ICD-10-CM | POA: Diagnosis not present

## 2015-12-09 DIAGNOSIS — J1000 Influenza due to other identified influenza virus with unspecified type of pneumonia: Secondary | ICD-10-CM | POA: Diagnosis not present

## 2015-12-09 DIAGNOSIS — M25562 Pain in left knee: Secondary | ICD-10-CM | POA: Diagnosis not present

## 2015-12-09 DIAGNOSIS — Z471 Aftercare following joint replacement surgery: Secondary | ICD-10-CM | POA: Diagnosis not present

## 2015-12-09 DIAGNOSIS — Z760 Encounter for issue of repeat prescription: Secondary | ICD-10-CM | POA: Diagnosis not present

## 2015-12-09 DIAGNOSIS — Z8661 Personal history of infections of the central nervous system: Secondary | ICD-10-CM | POA: Diagnosis not present

## 2015-12-09 DIAGNOSIS — E876 Hypokalemia: Secondary | ICD-10-CM | POA: Diagnosis not present

## 2015-12-09 DIAGNOSIS — R7309 Other abnormal glucose: Secondary | ICD-10-CM | POA: Diagnosis not present

## 2015-12-09 LAB — BASIC METABOLIC PANEL
ANION GAP: 7 (ref 5–15)
BUN: 15 mg/dL (ref 6–20)
CALCIUM: 8.5 mg/dL — AB (ref 8.9–10.3)
CO2: 26 mmol/L (ref 22–32)
CREATININE: 1.04 mg/dL — AB (ref 0.44–1.00)
Chloride: 107 mmol/L (ref 101–111)
GFR, EST AFRICAN AMERICAN: 58 mL/min — AB (ref >60)
GFR, EST NON AFRICAN AMERICAN: 50 mL/min — AB (ref >60)
Glucose, Bld: 118 mg/dL — ABNORMAL HIGH (ref 65–99)
Potassium: 3.1 mmol/L — ABNORMAL LOW (ref 3.5–5.1)
SODIUM: 140 mmol/L (ref 135–145)

## 2015-12-09 LAB — CBC
HCT: 31.6 % — ABNORMAL LOW (ref 36.0–46.0)
Hemoglobin: 10.6 g/dL — ABNORMAL LOW (ref 12.0–15.0)
MCH: 33 pg (ref 26.0–34.0)
MCHC: 33.5 g/dL (ref 30.0–36.0)
MCV: 98.4 fL (ref 78.0–100.0)
PLATELETS: 155 10*3/uL (ref 150–400)
RBC: 3.21 MIL/uL — ABNORMAL LOW (ref 3.87–5.11)
RDW: 13.3 % (ref 11.5–15.5)
WBC: 9.4 10*3/uL (ref 4.0–10.5)

## 2015-12-09 NOTE — Clinical Social Work Note (Signed)
Patient to be d/c'ed today to [facility].  Patient and family agreeable to plans will transport via ems RN to call report to [phone].   Evette Cristal, MSW, [place]

## 2015-12-09 NOTE — Discharge Summary (Signed)
[name], MD   Biagio Borg, PA-C [address], [place], [place]  [address]                             [phone]  PATIENT ID: Donna Woods        MRN:  [id number]          DOB/AGE: 80/31/1935 / 82 y.o.    DISCHARGE SUMMARY  ADMISSION DATE:    12/06/2015 DISCHARGE DATE:   12/09/2015   ADMISSION DIAGNOSIS: LEFT KNEE OSTEOARTHRITIS    DISCHARGE DIAGNOSIS:  LEFT KNEE OSTEOARTHRITIS    ADDITIONAL DIAGNOSIS: Principal Problem:   Primary osteoarthritis of left knee Active Problems:   S/P total knee replacement using cement  Past Medical History  Diagnosis Date  . Hypokalemia   . Fe deficiency anemia   . Prediabetes   . GERD (gastroesophageal reflux disease)   . DJD (degenerative joint disease)   . Vitamin B deficiency   . HTN (hypertension)   . Bradycardia   . PVC's (premature ventricular contractions)   . Meningitis      10/2009  . Memory changes     PROCEDURE: Procedure(s): TOTAL KNEE ARTHROPLASTY Left on 12/06/2015  CONSULTS: none     HISTORY: Donna Woods is a very pleasant 82 year old white female who is seen today for evaluation of her left knee. She has had problems with her left knee in the past and actually had an arthroscopic debridement in February 2005 of the left knee. She has been doing fairly well. She has had a right total knee arthroplasty in 2003 and actually has had very good results with it so far. Her biggest problem is she has had worsening pain and discomfort in the left knee with feelings of instability and she is afraid of falling as it tries to give way on her. Since back in October she started having persistent pain. She has had previous corticosteroid injections. The last one in October 2016 and she states it did not work. The pain has come back and she is now having nighttime pain as well as pain with every step. Complains of swelling in the knee, as well as that giving way. She has failed the corticosteroid  injections  HOSPITAL COURSE:  Donna Woods is a 82 y.o. admitted on 12/06/2015 and found to have a diagnosis of [place].  After appropriate laboratory studies were obtained  they were taken to the operating room on 12/06/2015 and underwent  Procedure(s): TOTAL KNEE ARTHROPLASTY  Left.   They were given perioperative antibiotics:  Anti-infectives    Start     Dose/Rate Route Frequency Ordered Stop   12/06/15 1700  clindamycin (CLEOCIN) IVPB 600 mg     600 mg 100 mL/hr over 30 Minutes Intravenous 4 times per day 12/06/15 1610 12/07/15 0719   12/06/15 0930  clindamycin (CLEOCIN) IVPB 900 mg     900 mg 100 mL/hr over 30 Minutes Intravenous To [facility] 12/05/15 1405 12/06/15 1025    .  Tolerated the procedure well.  Placed with a foley intraoperatively.     Toradol was given post op.  POD #1, allowed out of bed to a chair.  PT for ambulation and exercise program.  Foley D/C'd in morning.  IV saline locked.  O2 discontionued.  POD #2, continued PT and ambulation.   Hemovac pulled.  POD #3, Continued PY. The remainder of the hospital course was dedicated to  ambulation and strengthening.   The patient was discharged on 3 Days Post-Op in  Stable condition.  Blood products given:none  DIAGNOSTIC STUDIES: Recent vital signs: Patient Vitals for the past 24 hrs:  BP Temp Temp src Pulse Resp SpO2  12/09/15 0435 (!) 145/61 mmHg 98.9 F (37.2 C) Oral 70 19 97 %  12/08/15 2145 (!) 151/61 mmHg 98.2 F (36.8 C) Oral 62 18 99 %  12/08/15 1300 (!) 120/47 mmHg 98.6 F (37 C) - 65 16 96 %       Recent laboratory studies:  Recent Labs  12/07/15 0716 12/08/15 0550 12/09/15 0510  WBC 9.1 10.9* 9.4  HGB 10.4* 10.8* 10.6*  HCT 31.3* 32.0* 31.6*  PLT 151 152 155    Recent Labs  12/07/15 0716 12/08/15 0550 12/09/15 0510  NA 136 138 140  K 3.0* 3.7 3.1*  CL 102 105 107  CO2 24 27 26   BUN 24* 20 15  CREATININE 1.21* 1.29* 1.04*  GLUCOSE 124* 108* 118*   CALCIUM 8.2* 8.7* 8.5*   Lab Results  Component Value Date   INR 1.10 11/22/2015   INR 1.15 11/28/2009   INR 1.00 11/22/2009     Recent Radiographic Studies :  Dg Chest 2 View  11/22/2015  CLINICAL DATA:  Preop testing EXAM: CHEST  2 VIEW COMPARISON:  12/04/2009 FINDINGS: Heart is upper limits normal in size. Lungs are clear. Eventration of the right hemidiaphragm. No effusions. No acute bony abnormality. IMPRESSION: No active cardiopulmonary disease. Electronically Signed   By: Rolm Baptise M.D.   On: 11/22/2015 12:13    DISCHARGE INSTRUCTIONS: Discharge Instructions    CPM    Complete by:  As directed   Continuous passive motion machine (CPM):      Use the CPM from 0 to 60 degrees for 6-8 hours per day.      You may increase by 5-10 per day.  You may break it up into 2 or 3 sessions per day.      Use CPM for 3-4 weeks or until you are told to stop.     Call MD / Call 911    Complete by:  As directed   If you experience chest pain or shortness of breath, CALL 911 and be transported to the hospital emergency room.  If you develope a fever above 101 F, pus (white drainage) or increased drainage or redness at the wound, or calf pain, call your surgeon's office.     Change dressing    Complete by:  As directed   DO NOT CHANGE YOUR DRESSING.     Constipation Prevention    Complete by:  As directed   Drink plenty of fluids.  Prune juice may be helpful.  You may use a stool softener, such as Colace (over the counter) 100 mg twice a day.  Use MiraLax (over the counter) for constipation as needed.     Diet general    Complete by:  As directed      Discharge instructions    Complete by:  As directed   [place] items at home which could result in a fall. This includes throw rugs or furniture in walking pathways ICE to the affected joint every three hours while awake for 30 minutes at a time, for at least the first 3-5 days, and then as needed for pain  and swelling.  Continue to use ice for pain and swelling. You may notice swelling that  will progress down to the foot and ankle.  This is normal after surgery.  Elevate your leg when you are not up walking on it.   Continue to use the breathing machine you got in the hospital (incentive spirometer) which will help keep your temperature down.  It is common for your temperature to cycle up and down following surgery, especially at night when you are not up moving around and exerting yourself.  The breathing machine keeps your lungs expanded and your temperature down.   DIET:  As you were doing prior to hospitalization, we recommend a well-balanced diet.  DRESSING / WOUND CARE / SHOWERING  Keep the surgical dressing until follow up.  The dressing is water proof, so you can shower without any extra covering.  IF THE DRESSING FALLS OFF or the wound gets wet inside, change the dressing with sterile gauze.  Please use good hand washing techniques before changing the dressing.  Do not use any lotions or creams on the incision until instructed by your surgeon.    ACTIVITY  Increase activity slowly as tolerated, but follow the weight bearing instructions below.   No driving for 6 weeks or until further direction given by your physician.  You cannot drive while taking narcotics.  No lifting or carrying greater than 10 lbs. until further directed by your surgeon. Avoid periods of inactivity such as sitting longer than an hour when not asleep. This helps prevent blood clots.  You may return to work once you are authorized by your doctor.     WEIGHT BEARING   Partial weight bearing with assist device as directed.  50%   EXERCISES  Results after joint replacement surgery are often greatly improved when you follow the exercise, range of motion and muscle strengthening exercises prescribed by your doctor. Safety measures are also important to protect the joint from further injury. Any time any of these  exercises cause you to have increased pain or swelling, decrease what you are doing until you are comfortable again and then slowly increase them. If you have problems or questions, call your caregiver or physical therapist for advice.   Rehabilitation is important following a joint replacement. After just a few days of immobilization, the muscles of the leg can become weakened and shrink (atrophy).  These exercises are designed to build up the tone and strength of the thigh and leg muscles and to improve motion. Often times heat used for twenty to thirty minutes before working out will loosen up your tissues and help with improving the range of motion but do not use heat for the first two weeks following surgery (sometimes heat can increase post-operative swelling).   These exercises can be done on a training (exercise) mat,  on a table or on a bed. Use whatever works the best and is most comfortable for you.    Use music or television while you are exercising so that the exercises are a pleasant break in your day. This will make your life better with the exercises acting as a break in your routine that you can look forward to.   Perform all exercises about fifteen times, three times per day or as directed.  You should exercise both the operative leg and the other leg as well.   Exercises include:  Quad Sets - Tighten up the muscle on the front of the thigh (Quad) and hold for 5-10 seconds.   Straight Leg Raises - With your knee straight (if you were given  a brace, keep it on), lift the leg to 60 degrees, hold for 3 seconds, and slowly lower the leg.  Perform this exercise against resistance later as your leg gets stronger.  Leg Slides: Lying on your back, slowly slide your foot toward your buttocks, bending your knee up off the floor (only go as far as is comfortable). Then slowly slide your foot back down until your leg is flat on the floor again.  Angel Wings: Lying on your back spread your legs to  the side as far apart as you can without causing discomfort.  Hamstring Strength:  Lying on your back, push your heel against the floor with your leg straight by tightening up the muscles of your buttocks.  Repeat, but this time bend your knee to a comfortable angle, and push your heel against the floor.  You may put a pillow under the heel to make it more comfortable if necessary.   A rehabilitation program following joint replacement surgery can speed recovery and prevent re-injury in the future due to weakened muscles. Contact your doctor or a physical therapist for more information on knee rehabilitation.    CONSTIPATION  Constipation is defined medically as fewer than three stools per week and severe constipation as less than one stool per week.  Even if you have a regular bowel pattern at home, your normal regimen is likely to be disrupted due to multiple reasons following surgery.  Combination of anesthesia, postoperative narcotics, change in appetite and fluid intake all can affect your bowels.   YOU MUST use at least one of the following options; they are listed in order of increasing strength to get the job done.  They are all available over the counter, and you may need to use some, POSSIBLY even all of these options:    Drink plenty of fluids (prune juice may be helpful) and high fiber foods Colace 100 mg by mouth twice a day  Senokot for constipation as directed and as needed Dulcolax (bisacodyl), take with full glass of water  Miralax (polyethylene glycol) once or twice a day as needed.  If you have tried all these things and are unable to have a bowel movement in the first 3-4 days after surgery call either your surgeon or your primary doctor.    If you experience loose stools or diarrhea, hold the medications until you stool forms back up.  If your symptoms do not get better within 1 week or if they get worse, check with your doctor.  If you experience "the worst abdominal pain  ever" or develop nausea or vomiting, please contact the office immediately for further recommendations for treatment.   ITCHING:  If you experience itching with your medications, try taking only a single pain pill, or even half a pain pill at a time.  You can also use Benadryl over the counter for itching or also to help with sleep.   TED HOSE STOCKINGS:  Use stockings on both legs until for at least 2 weeks or as directed by physician office. They may be removed at night for sleeping.  MEDICATIONS:  See your medication summary on the "After Visit Summary" that nursing will review with you.  You may have some home medications which will be placed on hold until you complete the course of blood thinner medication.  It is important for you to complete the blood thinner medication as prescribed.  PRECAUTIONS:  If you experience chest pain or shortness of breath - call 911  immediately for transfer to the hospital emergency department.   If you develop a fever greater that 101 F, purulent drainage from wound, increased redness or drainage from wound, foul odor from the wound/dressing, or calf pain - CONTACT YOUR SURGEON.                                                   FOLLOW-UP APPOINTMENTS:  If you do not already have a post-op appointment, please call the office for an appointment to be seen by your surgeon.  Guidelines for how soon to be seen are listed in your "After Visit Summary", but are typically between 1-4 weeks after surgery.  OTHER INSTRUCTIONS:   Knee Replacement:  Do not place pillow under knee, focus on keeping the knee straight while resting. CPM instructions: 0-90 degrees, 2 hours in the morning, 2 hours in the afternoon, and 2 hours in the evening. Place foam block, curve side up under heel at all times except when in CPM or when walking.  DO NOT modify, tear, cut, or change the foam block in any way.  MAKE SURE YOU:  Understand these instructions.  Get help right away if you are  not doing well or get worse.    Thank you for letting us be a part of your medical care team.  It is a privilege we respect greatly.  We hope these instructions will help you stay on track for a fast and full recovery!     Do not put a pillow under the knee. Place it under the heel.    Complete by:  As directed      Driving restrictions    Complete by:  As directed   No driving for 6 weeks     Increase activity slowly as tolerated    Complete by:  As directed      Lifting restrictions    Complete by:  As directed   No lifting for 6 weeks     Partial weight bearing    Complete by:  As directed   % Body Weight:  50%  Laterality:  left  Extremity:  Lower     Patient may shower    Complete by:  As directed   You may shower over your dressing     TED hose    Complete by:  As directed   Use stockings (TED hose) for 3 weeks on left  leg.  You may remove them at night for sleeping.           DISCHARGE MEDICATIONS:     Medication List    STOP taking these medications        CO Q 10 PO      TAKE these medications        albuterol 108 (90 Base) MCG/ACT inhaler  Commonly known as:  PROVENTIL HFA;VENTOLIN HFA  Inhale 2 puffs into the lungs every 6 (six) hours as needed.     atenolol 25 MG tablet  Commonly known as:  TENORMIN  Take 0.5 tablets (12.5 mg total) by mouth daily.     CALCIUM MAGNESIUM PO  Take by mouth daily.     cetirizine 10 MG tablet  Commonly known as:  ZYRTEC  Take by mouth.     Cetirizine HCl 10 MG Caps  Take 1 capsule by mouth daily.  lidocaine 5 %  Commonly known as:  LIDODERM  Place 1 patch onto the skin daily. Remove & Discard patch within 12 hours or as directed by MD     methocarbamol 500 MG tablet  Commonly known as:  ROBAXIN  Take 1 tablet (500 mg total) by mouth every 8 (eight) hours as needed for muscle spasms.     multivitamin capsule  Take 1 capsule by mouth daily.     omeprazole 20 MG capsule  Commonly known as:  PRILOSEC   TAKE ONE CAPSULE BY MOUTH ONCE DAILY     oxyCODONE 5 MG immediate release tablet  Commonly known as:  Oxy IR/ROXICODONE  Take 1-2 tablets (5-10 mg total) by mouth every 4 (four) hours as needed for moderate pain or severe pain.     potassium chloride 10 MEQ CR capsule  Commonly known as:  MICRO-K  Take 10 mEq by mouth 2 (two) times daily.     rivaroxaban 10 MG Tabs tablet  Commonly known as:  XARELTO  Take 1 tablet (10 mg total) by mouth daily with breakfast.     triamterene-hydrochlorothiazide 37.5-25 MG tablet  Commonly known as:  MAXZIDE-25  TAKE 1/2 TO 1 TABLET BY MOUTH EVERY DAY        FOLLOW UP VISIT:       Follow-up Information    Follow up with Garald Balding, MD On 12/19/2015.   Specialty:  Orthopedic Surgery   Contact information:   [place]. [place]  [address] [phone]       DISPOSITION:   Skilled Nursing Facility/Rehab  CONDITION:  Stable   Mike Craze. [place], [place] [phone]  12/09/2015 9:38 AM

## 2015-12-09 NOTE — Progress Notes (Signed)
Patient ID: Donna Woods, female   DOB: 1934-07-30, 82 y.o.   MRN: [id number] PATIENT ID: Donna Woods        MRN:  [id number]          DOB/AGE: 11-Oct-1934 / 82 y.o.    [name], MD   Biagio Borg, PA-C [address] [place], [place]  [address]                             [phone]   PROGRESS NOTE  Subjective:  negative for Chest Pain  negative for Shortness of Breath  negative for Nausea/Vomiting   negative for Calf Pain    Tolerating Diet: yes         Patient reports pain as moderate.     Vague pain in left hip and "maybe low back" started several hours ago-afebrile, voiding without problem, no nausea- left knee seems fine-no active drainage on dressing, without calf pain or distal edema. No percussible tenderness in flanks or L-S spine. Presently without pain;no hx of injury  Objective: Vital signs in last 24 hours:   Patient Vitals for the past 24 hrs:  BP Temp Temp src Pulse Resp SpO2  12/09/15 0435 (!) 145/61 mmHg 98.9 F (37.2 C) Oral 70 19 97 %  12/08/15 2145 (!) 151/61 mmHg 98.2 F (36.8 C) Oral 62 18 99 %  12/08/15 1300 (!) 120/47 mmHg 98.6 F (37 C) - 65 16 96 %      Intake/Output from previous day:   01/12 0701 - 01/13 0700 In: 600 [P.O.:600] Out: 1025 [Urine:1025]   Intake/Output this shift:       Intake/Output      01/12 0701 - 01/13 0700 01/13 0701 - 01/14 0700   P.O. 600    Total Intake(mL/kg) 600 (9)    Urine (mL/kg/hr) 1025 (0.6)    Emesis/NG output     Drains 0 (0)    Total Output 1025     Net -425          Urine Occurrence 2 x       LABORATORY DATA:  Recent Labs  12/07/15 0716 12/08/15 0550 12/09/15 0510  WBC 9.1 10.9* 9.4  HGB 10.4* 10.8* 10.6*  HCT 31.3* 32.0* 31.6*  PLT 151 152 155    Recent Labs  12/07/15 0716 12/08/15 0550 12/09/15 0510  NA 136 138 140  K 3.0* 3.7 3.1*  CL 102 105 107  CO2 24 27 26   BUN 24* 20 15  CREATININE 1.21* 1.29* 1.04*  GLUCOSE 124* 108* 118*  CALCIUM 8.2* 8.7* 8.5*     Lab Results  Component Value Date   INR 1.10 11/22/2015   INR 1.15 11/28/2009   INR 1.00 11/22/2009    Recent Radiographic Studies :  Dg Chest 2 View  11/22/2015  CLINICAL DATA:  Preop testing EXAM: CHEST  2 VIEW COMPARISON:  12/04/2009 FINDINGS: Heart is upper limits normal in size. Lungs are clear. Eventration of the right hemidiaphragm. No effusions. No acute bony abnormality. IMPRESSION: No active cardiopulmonary disease. Electronically Signed   By: Rolm Baptise M.D.   On: 11/22/2015 12:13     Examination:  General appearance: alert, cooperative and no distress  Wound Exam: clean, dry, intact   Drainage:  None: wound tissue dry  Motor Exam: EHL, FHL, Anterior Tibial and Posterior Tibial Intact  Sensory Exam: Superficial Peroneal, Deep Peroneal and Tibial normal  Vascular Exam: Normal  Assessment:    3 Days Post-Op  Procedure(s) (LRB): TOTAL KNEE ARTHROPLASTY (Left)  ADDITIONAL DIAGNOSIS:  Principal Problem:   Primary osteoarthritis of left knee Active Problems:   S/P total knee replacement using cement  Acute Blood Loss Anemia and Hypokalemia-asymptomatic, K has been normal to low   Plan: Physical Therapy as ordered Partial Weight Bearing @ 50% (PWB)  DVT Prophylaxis:  Xarelto and TED hose  DISCHARGE PLAN: Skilled Nursing Facility/Rehab  DISCHARGE NEEDS: HHPT, CPM, Walker and 3-in-1 comode seat  `no obvious cause of related pain, VS stable, discussed with nurse who related no issues-comfortable at present, OK for D/C to rehab facility      [facility], Collier [place] W  12/09/2015 7:51 AM

## 2015-12-09 NOTE — Progress Notes (Signed)
Pt ready for d/c to SNF per MD. Report called to Hershal Coria at [facility] SN[facility], all questions answered. Belongings gathered and sent with pt's daughter and husband.   [place], Jerry Caras

## 2015-12-09 NOTE — Progress Notes (Signed)
Physical Therapy Treatment Patient Details Name: Donna Woods MRN: [id number] DOB: [birth year] Today's Date: 12-14-2015    History of Present Illness 82 y.o. female now s/p Lt TKA (cemented). PMH: Rt TKA, hypertension, bradycardia, memory changes    PT Comments    Patient having difficulty with mobility today, limited by pain.  Patient to discharge to SNF today for continued therapy.  Follow Up Recommendations  SNF;Supervision/Assistance - 24 hour     Equipment Recommendations  None recommended by PT    Recommendations for Other Services       Precautions / Restrictions Precautions Precautions: Knee;Fall Restrictions Weight Bearing Restrictions: Yes LLE Weight Bearing: Partial weight bearing LLE Partial Weight Bearing Percentage or Pounds: 50    Mobility  Bed Mobility Overal bed mobility: Needs Assistance Bed Mobility: Supine to Sit;Sit to Supine     Supine to sit: Min assist Sit to supine: Mod assist   General bed mobility comments: Verbal cues for technique.  Assist to bring LLE off of bed and to raise trunk to sitting position.  Assist to scoot forward toward EOB.  Patient with difficulty allowing Lt knee to flex greater than 50* due to pain.  Sat EOB x 4 minutes.  Returned to supine with mod assist due to pain.  Transfers                    Ambulation/Gait                 Stairs            Wheelchair Mobility    Modified Rankin (Stroke Patients Only)       Balance                                    Cognition Arousal/Alertness: Awake/alert Behavior During Therapy: WFL for tasks assessed/performed;Anxious Overall Cognitive Status: History of cognitive impairments - at baseline                      Exercises Total Joint Exercises Ankle Circles/Pumps: AROM;Both;10 reps;Supine Quad Sets: Strengthening;Left;10 reps;AROM;Supine Towel Squeeze: AROM;Both;10 reps;Supine Short Arc Quad: AROM;Left;5  reps;Supine Heel Slides: AAROM;Left;10 reps;Supine Hip ABduction/ADduction: AAROM;Left;10 reps;Supine    General Comments        Pertinent Vitals/Pain Pain Assessment: 0-10 Pain Score: 6  Pain Location: Lt knee and hip Pain Descriptors / Indicators: Aching Pain Intervention(s): Limited activity within patient's tolerance;Monitored during session;Repositioned    Home Living                      Prior Function            PT Goals (current goals can now be found in the care plan section) Progress towards PT goals: Progressing toward goals    Frequency  7X/week    PT Plan Current plan remains appropriate    Co-evaluation             End of Session   Activity Tolerance: Patient limited by pain Patient left: in bed;with call bell/phone within reach;with family/visitor present     Time: [id number] PT Time Calculation (min) (ACUTE ONLY): 16 min  Charges:  $Therapeutic Activity: 8-22 mins                    G Codes:      Despina Pole 2015-12-14, 4:20 PM Carita Pian. Rosana Hoes,  PT, [place] Pager [phone]

## 2015-12-11 LAB — URINE CULTURE

## 2015-12-12 DIAGNOSIS — M25569 Pain in unspecified knee: Secondary | ICD-10-CM | POA: Diagnosis not present

## 2015-12-13 DIAGNOSIS — S50901A Unspecified superficial injury of right elbow, initial encounter: Secondary | ICD-10-CM | POA: Diagnosis not present

## 2015-12-13 DIAGNOSIS — J1000 Influenza due to other identified influenza virus with unspecified type of pneumonia: Secondary | ICD-10-CM | POA: Diagnosis not present

## 2015-12-13 DIAGNOSIS — R262 Difficulty in walking, not elsewhere classified: Secondary | ICD-10-CM | POA: Diagnosis not present

## 2015-12-13 DIAGNOSIS — Z471 Aftercare following joint replacement surgery: Secondary | ICD-10-CM | POA: Diagnosis not present

## 2015-12-14 DIAGNOSIS — R262 Difficulty in walking, not elsewhere classified: Secondary | ICD-10-CM | POA: Diagnosis not present

## 2015-12-14 DIAGNOSIS — M25569 Pain in unspecified knee: Secondary | ICD-10-CM | POA: Diagnosis not present

## 2015-12-16 DIAGNOSIS — D649 Anemia, unspecified: Secondary | ICD-10-CM | POA: Diagnosis not present

## 2015-12-16 DIAGNOSIS — Z760 Encounter for issue of repeat prescription: Secondary | ICD-10-CM | POA: Diagnosis not present

## 2015-12-16 DIAGNOSIS — I10 Essential (primary) hypertension: Secondary | ICD-10-CM | POA: Diagnosis not present

## 2015-12-16 DIAGNOSIS — K219 Gastro-esophageal reflux disease without esophagitis: Secondary | ICD-10-CM | POA: Diagnosis not present

## 2015-12-19 DIAGNOSIS — M1712 Unilateral primary osteoarthritis, left knee: Secondary | ICD-10-CM | POA: Diagnosis not present

## 2015-12-24 DIAGNOSIS — R7303 Prediabetes: Secondary | ICD-10-CM | POA: Diagnosis not present

## 2015-12-24 DIAGNOSIS — D509 Iron deficiency anemia, unspecified: Secondary | ICD-10-CM | POA: Diagnosis not present

## 2015-12-24 DIAGNOSIS — Z96652 Presence of left artificial knee joint: Secondary | ICD-10-CM | POA: Diagnosis not present

## 2015-12-24 DIAGNOSIS — E876 Hypokalemia: Secondary | ICD-10-CM | POA: Diagnosis not present

## 2015-12-24 DIAGNOSIS — I10 Essential (primary) hypertension: Secondary | ICD-10-CM | POA: Diagnosis not present

## 2015-12-24 DIAGNOSIS — Z471 Aftercare following joint replacement surgery: Secondary | ICD-10-CM | POA: Diagnosis not present

## 2015-12-26 DIAGNOSIS — Z471 Aftercare following joint replacement surgery: Secondary | ICD-10-CM | POA: Diagnosis not present

## 2015-12-26 DIAGNOSIS — D509 Iron deficiency anemia, unspecified: Secondary | ICD-10-CM | POA: Diagnosis not present

## 2015-12-26 DIAGNOSIS — R7303 Prediabetes: Secondary | ICD-10-CM | POA: Diagnosis not present

## 2015-12-26 DIAGNOSIS — I10 Essential (primary) hypertension: Secondary | ICD-10-CM | POA: Diagnosis not present

## 2015-12-26 DIAGNOSIS — E876 Hypokalemia: Secondary | ICD-10-CM | POA: Diagnosis not present

## 2015-12-26 DIAGNOSIS — Z96652 Presence of left artificial knee joint: Secondary | ICD-10-CM | POA: Diagnosis not present

## 2015-12-28 DIAGNOSIS — D509 Iron deficiency anemia, unspecified: Secondary | ICD-10-CM | POA: Diagnosis not present

## 2015-12-28 DIAGNOSIS — Z96652 Presence of left artificial knee joint: Secondary | ICD-10-CM | POA: Diagnosis not present

## 2015-12-28 DIAGNOSIS — Z471 Aftercare following joint replacement surgery: Secondary | ICD-10-CM | POA: Diagnosis not present

## 2015-12-28 DIAGNOSIS — R7303 Prediabetes: Secondary | ICD-10-CM | POA: Diagnosis not present

## 2015-12-28 DIAGNOSIS — I10 Essential (primary) hypertension: Secondary | ICD-10-CM | POA: Diagnosis not present

## 2015-12-28 DIAGNOSIS — E876 Hypokalemia: Secondary | ICD-10-CM | POA: Diagnosis not present

## 2015-12-30 DIAGNOSIS — R7303 Prediabetes: Secondary | ICD-10-CM | POA: Diagnosis not present

## 2015-12-30 DIAGNOSIS — E876 Hypokalemia: Secondary | ICD-10-CM | POA: Diagnosis not present

## 2015-12-30 DIAGNOSIS — Z96652 Presence of left artificial knee joint: Secondary | ICD-10-CM | POA: Diagnosis not present

## 2015-12-30 DIAGNOSIS — D509 Iron deficiency anemia, unspecified: Secondary | ICD-10-CM | POA: Diagnosis not present

## 2015-12-30 DIAGNOSIS — Z471 Aftercare following joint replacement surgery: Secondary | ICD-10-CM | POA: Diagnosis not present

## 2015-12-30 DIAGNOSIS — I10 Essential (primary) hypertension: Secondary | ICD-10-CM | POA: Diagnosis not present

## 2016-01-02 DIAGNOSIS — R7303 Prediabetes: Secondary | ICD-10-CM | POA: Diagnosis not present

## 2016-01-02 DIAGNOSIS — I10 Essential (primary) hypertension: Secondary | ICD-10-CM | POA: Diagnosis not present

## 2016-01-02 DIAGNOSIS — D509 Iron deficiency anemia, unspecified: Secondary | ICD-10-CM | POA: Diagnosis not present

## 2016-01-02 DIAGNOSIS — Z471 Aftercare following joint replacement surgery: Secondary | ICD-10-CM | POA: Diagnosis not present

## 2016-01-02 DIAGNOSIS — Z96652 Presence of left artificial knee joint: Secondary | ICD-10-CM | POA: Diagnosis not present

## 2016-01-02 DIAGNOSIS — E876 Hypokalemia: Secondary | ICD-10-CM | POA: Diagnosis not present

## 2016-01-04 DIAGNOSIS — Z471 Aftercare following joint replacement surgery: Secondary | ICD-10-CM | POA: Diagnosis not present

## 2016-01-04 DIAGNOSIS — D509 Iron deficiency anemia, unspecified: Secondary | ICD-10-CM | POA: Diagnosis not present

## 2016-01-04 DIAGNOSIS — I10 Essential (primary) hypertension: Secondary | ICD-10-CM | POA: Diagnosis not present

## 2016-01-04 DIAGNOSIS — R7303 Prediabetes: Secondary | ICD-10-CM | POA: Diagnosis not present

## 2016-01-04 DIAGNOSIS — E876 Hypokalemia: Secondary | ICD-10-CM | POA: Diagnosis not present

## 2016-01-04 DIAGNOSIS — Z96652 Presence of left artificial knee joint: Secondary | ICD-10-CM | POA: Diagnosis not present

## 2016-01-06 DIAGNOSIS — R7303 Prediabetes: Secondary | ICD-10-CM | POA: Diagnosis not present

## 2016-01-06 DIAGNOSIS — Z96652 Presence of left artificial knee joint: Secondary | ICD-10-CM | POA: Diagnosis not present

## 2016-01-06 DIAGNOSIS — Z471 Aftercare following joint replacement surgery: Secondary | ICD-10-CM | POA: Diagnosis not present

## 2016-01-06 DIAGNOSIS — D509 Iron deficiency anemia, unspecified: Secondary | ICD-10-CM | POA: Diagnosis not present

## 2016-01-06 DIAGNOSIS — I10 Essential (primary) hypertension: Secondary | ICD-10-CM | POA: Diagnosis not present

## 2016-01-06 DIAGNOSIS — E876 Hypokalemia: Secondary | ICD-10-CM | POA: Diagnosis not present

## 2016-01-20 DIAGNOSIS — Z96652 Presence of left artificial knee joint: Secondary | ICD-10-CM | POA: Diagnosis not present

## 2016-01-20 DIAGNOSIS — M25562 Pain in left knee: Secondary | ICD-10-CM | POA: Diagnosis not present

## 2016-01-20 DIAGNOSIS — G8929 Other chronic pain: Secondary | ICD-10-CM | POA: Diagnosis not present

## 2016-01-20 DIAGNOSIS — M25662 Stiffness of left knee, not elsewhere classified: Secondary | ICD-10-CM | POA: Diagnosis not present

## 2016-01-24 DIAGNOSIS — G8929 Other chronic pain: Secondary | ICD-10-CM | POA: Diagnosis not present

## 2016-01-24 DIAGNOSIS — Z96652 Presence of left artificial knee joint: Secondary | ICD-10-CM | POA: Diagnosis not present

## 2016-01-24 DIAGNOSIS — M25662 Stiffness of left knee, not elsewhere classified: Secondary | ICD-10-CM | POA: Diagnosis not present

## 2016-01-24 DIAGNOSIS — M25562 Pain in left knee: Secondary | ICD-10-CM | POA: Diagnosis not present

## 2016-01-30 DIAGNOSIS — M25662 Stiffness of left knee, not elsewhere classified: Secondary | ICD-10-CM | POA: Diagnosis not present

## 2016-01-30 DIAGNOSIS — M25562 Pain in left knee: Secondary | ICD-10-CM | POA: Diagnosis not present

## 2016-01-30 DIAGNOSIS — Z96652 Presence of left artificial knee joint: Secondary | ICD-10-CM | POA: Diagnosis not present

## 2016-01-30 DIAGNOSIS — G8929 Other chronic pain: Secondary | ICD-10-CM | POA: Diagnosis not present

## 2016-02-02 DIAGNOSIS — M25562 Pain in left knee: Secondary | ICD-10-CM | POA: Diagnosis not present

## 2016-02-02 DIAGNOSIS — M25662 Stiffness of left knee, not elsewhere classified: Secondary | ICD-10-CM | POA: Diagnosis not present

## 2016-02-02 DIAGNOSIS — Z96652 Presence of left artificial knee joint: Secondary | ICD-10-CM | POA: Diagnosis not present

## 2016-02-02 DIAGNOSIS — G8929 Other chronic pain: Secondary | ICD-10-CM | POA: Diagnosis not present

## 2016-02-09 ENCOUNTER — Other Ambulatory Visit: Payer: Self-pay | Admitting: Internal Medicine

## 2016-02-09 NOTE — Telephone Encounter (Signed)
Does pt still use this after knee replacement?

## 2016-02-13 DIAGNOSIS — G8929 Other chronic pain: Secondary | ICD-10-CM | POA: Diagnosis not present

## 2016-02-13 DIAGNOSIS — M25562 Pain in left knee: Secondary | ICD-10-CM | POA: Diagnosis not present

## 2016-02-13 DIAGNOSIS — M25662 Stiffness of left knee, not elsewhere classified: Secondary | ICD-10-CM | POA: Diagnosis not present

## 2016-02-13 DIAGNOSIS — Z96652 Presence of left artificial knee joint: Secondary | ICD-10-CM | POA: Diagnosis not present

## 2016-02-16 DIAGNOSIS — M25662 Stiffness of left knee, not elsewhere classified: Secondary | ICD-10-CM | POA: Diagnosis not present

## 2016-02-16 DIAGNOSIS — G8929 Other chronic pain: Secondary | ICD-10-CM | POA: Diagnosis not present

## 2016-02-16 DIAGNOSIS — M25562 Pain in left knee: Secondary | ICD-10-CM | POA: Diagnosis not present

## 2016-02-16 DIAGNOSIS — Z96652 Presence of left artificial knee joint: Secondary | ICD-10-CM | POA: Diagnosis not present

## 2016-02-23 ENCOUNTER — Other Ambulatory Visit: Payer: Self-pay | Admitting: Internal Medicine

## 2016-02-24 DIAGNOSIS — M25562 Pain in left knee: Secondary | ICD-10-CM | POA: Diagnosis not present

## 2016-02-24 DIAGNOSIS — G8929 Other chronic pain: Secondary | ICD-10-CM | POA: Diagnosis not present

## 2016-02-24 DIAGNOSIS — M25662 Stiffness of left knee, not elsewhere classified: Secondary | ICD-10-CM | POA: Diagnosis not present

## 2016-02-24 DIAGNOSIS — Z96652 Presence of left artificial knee joint: Secondary | ICD-10-CM | POA: Diagnosis not present

## 2016-02-28 DIAGNOSIS — M25662 Stiffness of left knee, not elsewhere classified: Secondary | ICD-10-CM | POA: Diagnosis not present

## 2016-02-28 DIAGNOSIS — Z96652 Presence of left artificial knee joint: Secondary | ICD-10-CM | POA: Diagnosis not present

## 2016-02-28 DIAGNOSIS — M25562 Pain in left knee: Secondary | ICD-10-CM | POA: Diagnosis not present

## 2016-02-28 DIAGNOSIS — G8929 Other chronic pain: Secondary | ICD-10-CM | POA: Diagnosis not present

## 2016-03-01 DIAGNOSIS — G8929 Other chronic pain: Secondary | ICD-10-CM | POA: Diagnosis not present

## 2016-03-01 DIAGNOSIS — M25562 Pain in left knee: Secondary | ICD-10-CM | POA: Diagnosis not present

## 2016-03-01 DIAGNOSIS — M25662 Stiffness of left knee, not elsewhere classified: Secondary | ICD-10-CM | POA: Diagnosis not present

## 2016-03-01 DIAGNOSIS — Z96652 Presence of left artificial knee joint: Secondary | ICD-10-CM | POA: Diagnosis not present

## 2016-03-06 DIAGNOSIS — G8929 Other chronic pain: Secondary | ICD-10-CM | POA: Diagnosis not present

## 2016-03-06 DIAGNOSIS — M25562 Pain in left knee: Secondary | ICD-10-CM | POA: Diagnosis not present

## 2016-03-06 DIAGNOSIS — M25662 Stiffness of left knee, not elsewhere classified: Secondary | ICD-10-CM | POA: Diagnosis not present

## 2016-03-06 DIAGNOSIS — Z96652 Presence of left artificial knee joint: Secondary | ICD-10-CM | POA: Diagnosis not present

## 2016-03-12 DIAGNOSIS — M25662 Stiffness of left knee, not elsewhere classified: Secondary | ICD-10-CM | POA: Diagnosis not present

## 2016-03-12 DIAGNOSIS — M25562 Pain in left knee: Secondary | ICD-10-CM | POA: Diagnosis not present

## 2016-03-12 DIAGNOSIS — Z96652 Presence of left artificial knee joint: Secondary | ICD-10-CM | POA: Diagnosis not present

## 2016-03-12 DIAGNOSIS — G8929 Other chronic pain: Secondary | ICD-10-CM | POA: Diagnosis not present

## 2016-03-15 DIAGNOSIS — Z96652 Presence of left artificial knee joint: Secondary | ICD-10-CM | POA: Diagnosis not present

## 2016-03-15 DIAGNOSIS — M25562 Pain in left knee: Secondary | ICD-10-CM | POA: Diagnosis not present

## 2016-03-15 DIAGNOSIS — M25662 Stiffness of left knee, not elsewhere classified: Secondary | ICD-10-CM | POA: Diagnosis not present

## 2016-03-15 DIAGNOSIS — G8929 Other chronic pain: Secondary | ICD-10-CM | POA: Diagnosis not present

## 2016-03-19 ENCOUNTER — Other Ambulatory Visit: Payer: Self-pay | Admitting: Cardiovascular Disease

## 2016-03-20 NOTE — Telephone Encounter (Signed)
Rx(s) sent to pharmacy electronically.  

## 2016-03-21 DIAGNOSIS — M25662 Stiffness of left knee, not elsewhere classified: Secondary | ICD-10-CM | POA: Diagnosis not present

## 2016-03-21 DIAGNOSIS — G8929 Other chronic pain: Secondary | ICD-10-CM | POA: Diagnosis not present

## 2016-03-21 DIAGNOSIS — Z96652 Presence of left artificial knee joint: Secondary | ICD-10-CM | POA: Diagnosis not present

## 2016-03-21 DIAGNOSIS — M25562 Pain in left knee: Secondary | ICD-10-CM | POA: Diagnosis not present

## 2016-03-26 DIAGNOSIS — M25562 Pain in left knee: Secondary | ICD-10-CM | POA: Diagnosis not present

## 2016-03-26 DIAGNOSIS — M25662 Stiffness of left knee, not elsewhere classified: Secondary | ICD-10-CM | POA: Diagnosis not present

## 2016-03-26 DIAGNOSIS — G8929 Other chronic pain: Secondary | ICD-10-CM | POA: Diagnosis not present

## 2016-03-26 DIAGNOSIS — Z96652 Presence of left artificial knee joint: Secondary | ICD-10-CM | POA: Diagnosis not present

## 2016-03-29 DIAGNOSIS — G8929 Other chronic pain: Secondary | ICD-10-CM | POA: Diagnosis not present

## 2016-03-29 DIAGNOSIS — Z96652 Presence of left artificial knee joint: Secondary | ICD-10-CM | POA: Diagnosis not present

## 2016-03-29 DIAGNOSIS — M25662 Stiffness of left knee, not elsewhere classified: Secondary | ICD-10-CM | POA: Diagnosis not present

## 2016-03-29 DIAGNOSIS — M25562 Pain in left knee: Secondary | ICD-10-CM | POA: Diagnosis not present

## 2016-07-03 ENCOUNTER — Other Ambulatory Visit: Payer: Self-pay | Admitting: *Deleted

## 2016-07-03 ENCOUNTER — Other Ambulatory Visit: Payer: Self-pay

## 2016-07-03 MED ORDER — ATENOLOL 25 MG PO TABS: 12.5000 mg | ORAL_TABLET | Freq: Every day | ORAL | 0 refills | Status: DC

## 2016-07-03 MED ORDER — ATENOLOL 25 MG PO TABS: 12.5000 mg | ORAL_TABLET | Freq: Every day | ORAL | 3 refills | Status: DC

## 2016-07-04 ENCOUNTER — Telehealth: Payer: Self-pay | Admitting: Pharmacist Clinician (PhC)/ Clinical Pharmacy Specialist

## 2016-07-04 NOTE — Telephone Encounter (Signed)
Pt returned call and stated that her daughter was able to locate atenolol at another pharmacy so she wishes to remain on this medication for the time being. She will call in the future if her supply runs out. She thanked [place] for our efforts.

## 2016-07-04 NOTE — Telephone Encounter (Signed)
LMOM for patient to return call.  Atenolol backordered by manufacturers, with no anticipated release, per FDA.    Would recommend switch to metoprolol succinate 12.5 mg once daily.  If patient tolerates well, would suggest leaving her on this permanently.

## 2016-07-27 ENCOUNTER — Other Ambulatory Visit: Payer: Self-pay

## 2016-08-15 DIAGNOSIS — Z23 Encounter for immunization: Secondary | ICD-10-CM | POA: Diagnosis not present

## 2016-08-27 ENCOUNTER — Other Ambulatory Visit: Payer: Self-pay | Admitting: Internal Medicine

## 2016-10-08 ENCOUNTER — Other Ambulatory Visit: Payer: Self-pay | Admitting: Internal Medicine

## 2016-10-10 ENCOUNTER — Ambulatory Visit (INDEPENDENT_AMBULATORY_CARE_PROVIDER_SITE_OTHER): Payer: Medicare Other | Admitting: Orthopedic Surgery

## 2016-10-22 ENCOUNTER — Encounter: Payer: Self-pay | Admitting: *Deleted

## 2016-10-24 ENCOUNTER — Ambulatory Visit (INDEPENDENT_AMBULATORY_CARE_PROVIDER_SITE_OTHER): Payer: Medicare Other | Admitting: Cardiovascular Disease

## 2016-10-24 ENCOUNTER — Encounter: Payer: Self-pay | Admitting: Cardiovascular Disease

## 2016-10-24 VITALS — BP 124/74 | HR 43 | Ht 66.0 in | Wt 150.0 lb

## 2016-10-24 DIAGNOSIS — Z13220 Encounter for screening for lipoid disorders: Secondary | ICD-10-CM | POA: Diagnosis not present

## 2016-10-24 DIAGNOSIS — I10 Essential (primary) hypertension: Secondary | ICD-10-CM

## 2016-10-24 DIAGNOSIS — R001 Bradycardia, unspecified: Secondary | ICD-10-CM

## 2016-10-24 DIAGNOSIS — I493 Ventricular premature depolarization: Secondary | ICD-10-CM

## 2016-10-24 NOTE — Assessment & Plan Note (Signed)
History of asymptomatic bradycardia with heart rates in the low 40s

## 2016-10-24 NOTE — Assessment & Plan Note (Signed)
History of hypertension blood pressure measured 124/74. She is on low-dose beta blocker atenolol as well as Maxide. Continue current meds at current dosing

## 2016-10-24 NOTE — Progress Notes (Signed)
10/24/2016 [name]   [birth year]  [id number]  Primary Physician Elby Showers, MD Primary Cardiologist: Lorretta Harp MD Donna Woods  HPI:  The patient is delightful, 82 year old, thin appearing, married, Caucasian female mother of 2, grandmother to 6 grandchildren who was formerly a patient of Dr. Janene Madeira remotely. I saw her 10/14/15.  She has a history of palpitations related to asymptomatic PVCs, hypertension. She denies chest pain or shortness of breath. Her last stress test performed 5 years ago was nonischemic. She did have a poorly defined neurologic illness which affected her memory and spanned many months requiring prolonged hospitalization and antibiotic therapy which she has for the most part recovered from.saw her a year ago she is asymptomatic. She denies chest pain shortness of breath dizziness or presyncope.she was referred back for preoperative clearance before elective left total knee replacement by Dr. Durward Fortes. She had a Myoview stress test performed 10/19/15 which was low risk patient underwent total knee replacement without complication. Since I saw her a year ago she has remained asymptomatic.   Current Outpatient Prescriptions  Medication Sig Dispense Refill  . albuterol (PROVENTIL HFA;VENTOLIN HFA) 108 (90 BASE) MCG/ACT inhaler Inhale 2 puffs into the lungs every 6 (six) hours as needed.    Marland Kitchen atenolol (TENORMIN) 25 MG tablet Take 0.5 tablets (12.5 mg total) by mouth daily. 45 tablet 0  . Calcium-Magnesium-Vitamin D (CALCIUM MAGNESIUM PO) Take by mouth daily.    . cetirizine (ZYRTEC) 10 MG tablet Take by mouth.    . Cetirizine HCl 10 MG CAPS Take 1 capsule by mouth daily.      Marland Kitchen lidocaine (LIDODERM) 5 % APPLY 1 TO 3 PATCHES FOR 12 HOURS OUT OF 24 HOURS AS DIRECTED 90 patch 0  . methocarbamol (ROBAXIN) 500 MG tablet Take 1 tablet (500 mg total) by mouth every 8 (eight) hours as needed for muscle spasms. 30 tablet 0  . Multiple Vitamin  (MULTIVITAMIN) capsule Take 1 capsule by mouth daily.    Marland Kitchen omeprazole (PRILOSEC) 20 MG capsule TAKE ONE CAPSULE BY MOUTH ONCE DAILY 90 capsule 3  . oxyCODONE (OXY IR/ROXICODONE) 5 MG immediate release tablet Take 1-2 tablets (5-10 mg total) by mouth every 4 (four) hours as needed for moderate pain or severe pain. 30 tablet 0  . potassium chloride (MICRO-K) 10 MEQ CR capsule Take 10 mEq by mouth 2 (two) times daily.     . rivaroxaban (XARELTO) 10 MG TABS tablet Take 1 tablet (10 mg total) by mouth daily with breakfast. 12 tablet 0  . triamterene-hydrochlorothiazide (MAXZIDE-25) 37.5-25 MG tablet TAKE ONE-HALF TO ONE TABLET BY MOUTH ONCE DAILY 90 tablet 1   No current facility-administered medications for this visit.     Allergies  Allergen Reactions  . Adhesive [Tape]   . Benicar [Olmesartan]   . Penicillins Rash    Has patient had a PCN reaction causing immediate rash, facial/tongue/throat swelling, SOB or lightheadedness with hypotension: Yes Has patient had a PCN reaction causing severe rash involving mucus membranes or skin necrosis: No Has patient had a PCN reaction that required hospitalization No Has patient had a PCN reaction occurring within the last 10 years: No If all of the above answers are "NO", then may proceed with Cephalosporin use.     Social History   Social History  . Marital status: Married    Spouse name: N/A  . Number of children: N/A  . Years of education: N/A   Occupational History  .  Not on file.   Social History Main Topics  . Smoking status: Never Smoker  . Smokeless tobacco: Never Used  . Alcohol use No  . Drug use: No  . Sexual activity: No   Other Topics Concern  . Not on file   Social History Narrative  . No narrative on file     Review of Systems: General: negative for chills, fever, night sweats or weight changes.  Cardiovascular: negative for chest pain, dyspnea on exertion, edema, orthopnea, palpitations, paroxysmal nocturnal  dyspnea or shortness of breath Dermatological: negative for rash Respiratory: negative for cough or wheezing Urologic: negative for hematuria Abdominal: negative for nausea, vomiting, diarrhea, bright red blood per rectum, melena, or hematemesis Neurologic: negative for visual changes, syncope, or dizziness All other systems reviewed and are otherwise negative except as noted above.    Blood pressure 124/74, pulse (!) 43, height 5\' 6"  (1.676 m), weight 150 lb (68 kg).  General appearance: alert and no distress Neck: no adenopathy, no carotid bruit, no JVD, supple, symmetrical, trachea midline and thyroid not enlarged, symmetric, no tenderness/mass/nodules Lungs: clear to auscultation bilaterally Heart: regular rate and rhythm, S1, S2 normal, no murmur, click, rub or gallop Extremities: extremities normal, atraumatic, no cyanosis or edema  EKG sinus bradycardia at 43 without ST or T-wave changes. There was poor R-wave progression. I personally reviewed this EKG  ASSESSMENT AND PLAN:   Hypertension History of hypertension blood pressure measured 124/74. She is on low-dose beta blocker atenolol as well as Maxide. Continue current meds at current dosing  Bradycardia History of asymptomatic bradycardia with heart rates in the low 40s      Lorretta Harp MD [facility], [facility] 10/24/2016 1:18 PM

## 2016-10-24 NOTE — Patient Instructions (Signed)

## 2016-10-25 ENCOUNTER — Other Ambulatory Visit (INDEPENDENT_AMBULATORY_CARE_PROVIDER_SITE_OTHER): Payer: Medicare Other

## 2016-10-25 DIAGNOSIS — Z13220 Encounter for screening for lipoid disorders: Secondary | ICD-10-CM | POA: Diagnosis not present

## 2016-10-25 LAB — LIPID PANEL
CHOLESTEROL: 201 mg/dL — AB (ref 0–200)
HDL: 55.4 mg/dL (ref >39.00)
LDL Cholesterol: 124 mg/dL — ABNORMAL HIGH (ref 0–99)
NONHDL: 145.9
Total CHOL/HDL Ratio: 4
Triglycerides: 111 mg/dL (ref 0.0–149.0)
VLDL: 22.2 mg/dL (ref 0.0–40.0)

## 2016-10-25 LAB — HEPATIC FUNCTION PANEL
ALBUMIN: 4.4 g/dL (ref 3.5–5.2)
ALK PHOS: 64 U/L (ref 39–117)
ALT: 10 U/L (ref 0–35)
AST: 8 U/L (ref 0–37)
Bilirubin, Direct: 0.1 mg/dL (ref 0.0–0.3)
TOTAL PROTEIN: 6.9 g/dL (ref 6.0–8.3)
Total Bilirubin: 0.7 mg/dL (ref 0.2–1.2)

## 2016-10-26 ENCOUNTER — Encounter: Payer: Self-pay | Admitting: Cardiovascular Disease

## 2016-11-05 ENCOUNTER — Other Ambulatory Visit: Payer: Medicare Other | Admitting: Internal Medicine

## 2016-11-05 DIAGNOSIS — Z1329 Encounter for screening for other suspected endocrine disorder: Secondary | ICD-10-CM | POA: Diagnosis not present

## 2016-11-05 DIAGNOSIS — Z0000 Encounter for general adult medical examination without abnormal findings: Secondary | ICD-10-CM | POA: Diagnosis not present

## 2016-11-05 DIAGNOSIS — R7302 Impaired glucose tolerance (oral): Secondary | ICD-10-CM

## 2016-11-05 DIAGNOSIS — Z130 Encounter for screening for diseases of the blood and blood-forming organs and certain disorders involving the immune mechanism: Secondary | ICD-10-CM | POA: Diagnosis not present

## 2016-11-05 DIAGNOSIS — E559 Vitamin D deficiency, unspecified: Secondary | ICD-10-CM | POA: Diagnosis not present

## 2016-11-05 DIAGNOSIS — I10 Essential (primary) hypertension: Secondary | ICD-10-CM | POA: Diagnosis not present

## 2016-11-05 LAB — COMPLETE METABOLIC PANEL WITH GFR
ALBUMIN: 4.5 g/dL (ref 3.6–5.1)
ALK PHOS: 57 U/L (ref 33–130)
ALT: 9 U/L (ref 6–29)
AST: 8 U/L — ABNORMAL LOW (ref 10–35)
BILIRUBIN TOTAL: 0.6 mg/dL (ref 0.2–1.2)
BUN: 28 mg/dL — ABNORMAL HIGH (ref 7–25)
CO2: 26 mmol/L (ref 20–31)
CREATININE: 1.18 mg/dL — AB (ref 0.60–0.88)
Calcium: 9.7 mg/dL (ref 8.6–10.4)
Chloride: 106 mmol/L (ref 98–110)
GFR, EST NON AFRICAN AMERICAN: 44 mL/min — AB (ref >=60)
GFR, Est African American: 50 mL/min — ABNORMAL LOW (ref >=60)
GLUCOSE: 103 mg/dL — AB (ref 65–99)
Potassium: 4 mmol/L (ref 3.5–5.3)
SODIUM: 144 mmol/L (ref 135–146)
TOTAL PROTEIN: 6.7 g/dL (ref 6.1–8.1)

## 2016-11-05 LAB — LIPID PANEL
Cholesterol: 173 mg/dL (ref <200)
HDL: 54 mg/dL (ref >50)
LDL CALC: 101 mg/dL — AB (ref <100)
Total CHOL/HDL Ratio: 3.2 Ratio (ref <5.0)
Triglycerides: 89 mg/dL (ref <150)
VLDL: 18 mg/dL (ref <30)

## 2016-11-05 LAB — HEMOGLOBIN A1C
Hgb A1c MFr Bld: 5.6 % (ref <5.7)
Mean Plasma Glucose: 114 mg/dL

## 2016-11-05 LAB — CBC WITH DIFFERENTIAL/PLATELET
BASOS ABS: 70 {cells}/uL (ref 0–200)
BASOS PCT: 1 %
EOS ABS: 350 {cells}/uL (ref 15–500)
Eosinophils Relative: 5 %
HCT: 40.3 % (ref 35.0–45.0)
HEMOGLOBIN: 13.2 g/dL (ref 11.7–15.5)
LYMPHS ABS: 1750 {cells}/uL (ref 850–3900)
Lymphocytes Relative: 25 %
MCH: 32.1 pg (ref 27.0–33.0)
MCHC: 32.8 g/dL (ref 32.0–36.0)
MCV: 98.1 fL (ref 80.0–100.0)
MPV: 10.4 fL (ref 7.5–12.5)
Monocytes Absolute: 700 cells/uL (ref 200–950)
Monocytes Relative: 10 %
NEUTROS ABS: 4130 {cells}/uL (ref 1500–7800)
Neutrophils Relative %: 59 %
PLATELETS: 213 10*3/uL (ref 140–400)
RBC: 4.11 MIL/uL (ref 3.80–5.10)
RDW: 13.9 % (ref 11.0–15.0)
WBC: 7 10*3/uL (ref 3.8–10.8)

## 2016-11-05 LAB — TSH: TSH: 2.44 m[IU]/L

## 2016-11-06 ENCOUNTER — Ambulatory Visit (INDEPENDENT_AMBULATORY_CARE_PROVIDER_SITE_OTHER): Payer: Medicare Other | Admitting: Internal Medicine

## 2016-11-06 ENCOUNTER — Encounter: Payer: Self-pay | Admitting: Internal Medicine

## 2016-11-06 VITALS — BP 140/72 | HR 50 | Temp 98.4°F | Wt 153.0 lb

## 2016-11-06 DIAGNOSIS — R001 Bradycardia, unspecified: Secondary | ICD-10-CM | POA: Diagnosis not present

## 2016-11-06 DIAGNOSIS — M810 Age-related osteoporosis without current pathological fracture: Secondary | ICD-10-CM | POA: Diagnosis not present

## 2016-11-06 DIAGNOSIS — I10 Essential (primary) hypertension: Secondary | ICD-10-CM | POA: Diagnosis not present

## 2016-11-06 DIAGNOSIS — Z0000 Encounter for general adult medical examination without abnormal findings: Secondary | ICD-10-CM | POA: Diagnosis not present

## 2016-11-06 DIAGNOSIS — H9319 Tinnitus, unspecified ear: Secondary | ICD-10-CM | POA: Diagnosis not present

## 2016-11-06 DIAGNOSIS — E119 Type 2 diabetes mellitus without complications: Secondary | ICD-10-CM

## 2016-11-06 DIAGNOSIS — K219 Gastro-esophageal reflux disease without esophagitis: Secondary | ICD-10-CM

## 2016-11-06 DIAGNOSIS — R413 Other amnesia: Secondary | ICD-10-CM

## 2016-11-06 DIAGNOSIS — I493 Ventricular premature depolarization: Secondary | ICD-10-CM

## 2016-11-06 DIAGNOSIS — I878 Other specified disorders of veins: Secondary | ICD-10-CM

## 2016-11-06 DIAGNOSIS — E782 Mixed hyperlipidemia: Secondary | ICD-10-CM | POA: Diagnosis not present

## 2016-11-06 LAB — POCT URINALYSIS DIPSTICK
Bilirubin, UA: NEGATIVE
GLUCOSE UA: NEGATIVE
Ketones, UA: NEGATIVE
LEUKOCYTES UA: NEGATIVE
NITRITE UA: NEGATIVE
PROTEIN UA: NEGATIVE
UROBILINOGEN UA: 2
pH, UA: 6

## 2016-11-06 LAB — MICROALBUMIN, URINE: Microalb, Ur: 1.6 mg/dL

## 2016-11-06 LAB — VITAMIN D 25 HYDROXY (VIT D DEFICIENCY, FRACTURES): VIT D 25 HYDROXY: 46 ng/mL (ref 30–100)

## 2016-11-06 MED ORDER — TRIAMTERENE-HCTZ 37.5-25 MG PO TABS: ORAL_TABLET | ORAL | 1 refills | Status: DC

## 2016-11-06 MED ORDER — OMEPRAZOLE 20 MG PO CPDR: 20.0000 mg | DELAYED_RELEASE_CAPSULE | Freq: Every day | ORAL | 3 refills | Status: DC
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
## 2016-11-25 NOTE — Patient Instructions (Signed)
It was a pleasure to see you today. Continue same medications and return in one year.

## 2016-12-31 ENCOUNTER — Telehealth: Payer: Self-pay
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
## 2017-01-01 ENCOUNTER — Encounter (INDEPENDENT_AMBULATORY_CARE_PROVIDER_SITE_OTHER): Payer: Self-pay | Admitting: Orthopaedic Surgery

## 2017-01-01 ENCOUNTER — Ambulatory Visit (INDEPENDENT_AMBULATORY_CARE_PROVIDER_SITE_OTHER): Payer: Medicare Other

## 2017-01-01 ENCOUNTER — Ambulatory Visit (INDEPENDENT_AMBULATORY_CARE_PROVIDER_SITE_OTHER): Payer: Medicare Other | Admitting: Orthopaedic Surgery

## 2017-01-01 VITALS — BP 128/63 | HR 45 | Resp 14 | Ht 66.0 in | Wt 150.0 lb

## 2017-01-01 DIAGNOSIS — R0781 Pleurodynia: Secondary | ICD-10-CM

## 2017-01-01 NOTE — Progress Notes (Signed)
Office Visit Note   Patient: Donna Woods           Date of Birth: 1934-07-11           MRN: [id number] Visit Date: 01/01/2017              Requested by: Elby Showers, MD [address] [place], [place] [address] PCP: Elby Showers, MD   Assessment & Plan: Visit Diagnoses: Contusion left ribs without evidence of fracture or pneumothorax  Plan: Follow up as needed. No specific treatment necessary. Expect some discomfort for 3-4 weeks.   Follow-Up Instructions: No Follow-up on file.   Orders:  No orders of the defined types were placed in this encounter.  No orders of the defined types were placed in this encounter.     Procedures: No procedures performed   Clinical Data: No additional findings.   Subjective: No chief complaint on file.   Pt presents today complaining of Left shoulder and left rib pain when she fell down stairs and the railing hit her in the left rib area. 5 days ago.  Denies shortness of breath. Chest pain is localized along the anterior midclavicular line beneath the left breast. She denies any bruising.  Review of Systems   Objective: Vital Signs: There were no vitals taken for this visit.  Physical Exam  Ortho Exam's some local tenderness beneath the left breast in the midclavicular line. Skin intact. No bruising. Able take a deep breath with a little discomfort. No obvious shortness of breath. No pain with range of motion of left shoulder. No palpable crepitation.  Specialty Comments:  No specialty comments available.  Imaging: No results found.   PMFS History: Patient Active Problem List   Diagnosis Date Noted  . Primary osteoarthritis of left knee 12/06/2015  . S/P total knee replacement using cement 12/06/2015  . Impaired glucose tolerance 11/25/2015  . Osteoarthritis of right knee 11/25/2015  . Venous stasis 11/25/2015  . Stasis dermatitis 11/25/2015  . Bradycardia 10/11/2015  . Short-term memory loss  01/16/2015  . PVC's (premature ventricular contractions) 06/25/2013  . Hernia, inguinal, left 09/30/2012  . Hypertension 08/26/2011  . Allergic rhinitis 08/26/2011  . Osteoporosis 08/26/2011  . Fibromyalgia 08/26/2011  . DEMENTIA 03/20/2010  . GERD 01/25/2010  . DEGENERATIVE JOINT DISEASE 01/25/2010   Past Medical History:  Diagnosis Date  . Bradycardia   . DJD (degenerative joint disease)   . Fe deficiency anemia   . GERD (gastroesophageal reflux disease)   . HTN (hypertension)   . Hypokalemia   . Memory changes   . 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 Prediabetes   . PVC's (premature ventricular contractions)   . Vitamin B deficiency     Family History  Problem Relation Age of Onset  . Pneumonia Mother   . Pulmonary disease Father     Past Surgical History:  Procedure Laterality Date  . ABDOMINAL HYSTERECTOMY    . BREAST SURGERY     BENIGN LUMP REMOVED FROM ?LEFT bREAST  . CHOLECYSTECTOMY    . JOINT REPLACEMENT     RTA  . TONSILLECTOMY    . TOTAL ABDOMINAL HYSTERECTOMY W/ BILATERAL SALPINGOOPHORECTOMY     OVARY REMOVED A YR OR TWO BEFORE HYSTERECTOMY  . TOTAL KNEE ARTHROPLASTY Left 12/06/2015   Procedure: TOTAL KNEE ARTHROPLASTY;  Surgeon: Garald Balding, MD;  Location: [facility];  Service: Orthopedics;  Laterality: Left;   Social History   Occupational History  . Not on file.  Social History Main Topics  . Smoking status: Never Smoker  . Smokeless tobacco: Never Used  . Alcohol use No  . Drug use: No  . Sexual activity: No

## 2017-03-01 DIAGNOSIS — L239 Allergic contact dermatitis, unspecified cause: Secondary | ICD-10-CM | POA: Diagnosis not present

## 2017-04-09 ENCOUNTER — Other Ambulatory Visit: Payer: Self-pay | Admitting: Internal Medicine

## 2017-04-09 NOTE — Telephone Encounter (Signed)
Called and LMTCB. 

## 2017-04-09 NOTE — Telephone Encounter (Signed)
This is difficult to get approved. Please see why pt. needs this.

## 2017-04-10 NOTE — Telephone Encounter (Signed)
LMTCB on house number and mobile number has no VM set up

## 2017-04-11 NOTE — Telephone Encounter (Signed)
Received a fax from pharmacy in regards to this and informed them it is denied until we can talk with the patient.

## 2017-04-12 ENCOUNTER — Telehealth: Payer: Self-pay

## 2017-04-12 MED ORDER — LIDOCAINE 5 % EX PTCH: MEDICATED_PATCH | CUTANEOUS | 3 refills | Status: DC

## 2017-04-12 NOTE — Telephone Encounter (Signed)
Spoke to the pharmacist who is writing it on the patients chart.

## 2017-04-12 NOTE — Telephone Encounter (Signed)
Then refill prn one year and tell pharmacy they will pay out of pocket so no need for prior auth. Call pharmacy directly.

## 2017-04-12 NOTE — Telephone Encounter (Signed)
Pt's husband called and stated that she does in fact want the lidocaine patches and they know they have to pay out of pocket for it. Please advise.

## 2017-05-03 ENCOUNTER — Other Ambulatory Visit: Payer: Self-pay | Admitting: Cardiovascular Disease

## 2017-08-20 DIAGNOSIS — Z23 Encounter for immunization: Secondary | ICD-10-CM | POA: Diagnosis not present

## 2017-09-17 ENCOUNTER — Other Ambulatory Visit: Payer: Self-pay | Admitting: Cardiovascular Disease

## 2017-10-09 ENCOUNTER — Telehealth: Payer: Self-pay

## 2017-10-09 NOTE — Telephone Encounter (Signed)
Called pt because she is due for a CPE on 11/06/17 and we needed to get her scheduled she stated that she can not come in right now her husband was just released from the hospital from a bad fall. She will call back when she can reschedule

## 2017-10-25 ENCOUNTER — Ambulatory Visit: Payer: Medicare Other | Admitting: Cardiovascular Disease

## 2017-11-08 ENCOUNTER — Ambulatory Visit: Payer: Medicare Other | Admitting: Cardiovascular Disease

## 2017-12-04 ENCOUNTER — Encounter: Payer: Self-pay | Admitting: Cardiovascular Disease

## 2017-12-04 ENCOUNTER — Ambulatory Visit (INDEPENDENT_AMBULATORY_CARE_PROVIDER_SITE_OTHER): Payer: Medicare Other | Admitting: Cardiovascular Disease

## 2017-12-04 DIAGNOSIS — R001 Bradycardia, unspecified: Secondary | ICD-10-CM

## 2017-12-04 DIAGNOSIS — I10 Essential (primary) hypertension: Secondary | ICD-10-CM

## 2017-12-04 NOTE — Assessment & Plan Note (Signed)
History of chronic bradycardia with heart rates in the 40s on low-dose beta blocker. She simply asymptomatic. This is a similar reading that she had last year.

## 2017-12-04 NOTE — Assessment & Plan Note (Signed)
History of essential hypertension the blood pressure measures 126/62. She is on low-dose atenolol and Maxzide. Continue current meds at current dosing

## 2017-12-04 NOTE — Progress Notes (Signed)
12/04/2017 [name]   09-Jul-1934  [id number]  Primary Physician Baxley, Cresenciano Lick, MD Primary Cardiologist: Lorretta Harp MD Garret Reddish, [place], [place]  HPI:  Donna Woods is a 84 y.o.  thin appearing, married, Caucasian female mother of 2, grandmother to 6 grandchildren who was formerly a patient of Dr. Janene Madeira remotely. I saw her  10/24/16. She has a history of palpitations related to asymptomatic PVCs, hypertension. She denies chest pain or shortness of breath. Her last stress test performed 5 years ago was nonischemic. She did have a poorly defined neurologic illness which affected her memory and spanned many months requiring prolonged hospitalization and antibiotic therapy which she has for the most part recovered from.saw her a year ago she is asymptomatic. She denies chest pain shortness of breath dizziness or presyncope.she was referred back for preoperative clearance before elective left total knee replacement by Dr. Durward Fortes. She had a Myoview stress test performed 10/19/15 which was low risk patient underwent total knee replacement without complication. Since I saw her a year ago she has remained asymptomatic. She is chronically bradycardic with heart rates in the 40s on low-dose beta blocker is asymptomatic from this.      Current Meds  Medication Sig  . albuterol (PROVENTIL HFA;VENTOLIN HFA) 108 (90 BASE) MCG/ACT inhaler Inhale 2 puffs into the lungs every 6 (six) hours as needed.  Marland Kitchen atenolol (TENORMIN) 25 MG tablet TAKE 1/2 (ONE-HALF) TABLET BY MOUTH ONCE DAILY  . Calcium-Magnesium-Vitamin D (CALCIUM MAGNESIUM PO) Take by mouth daily.  Marland Kitchen lidocaine (LIDODERM) 5 % APPLY 1 TO 3 PATCHES FOR 12 HOURS OUT OF 24 HOURS AS DIRECTED  . Multiple Vitamin (MULTIVITAMIN) capsule Take 1 capsule by mouth daily.  Marland Kitchen omeprazole (PRILOSEC) 20 MG capsule Take 1 capsule (20 mg total) by mouth daily.  . potassium chloride (MICRO-K) 10 MEQ CR capsule Take 10 mEq by mouth 2 (two)  times daily.   Marland Kitchen triamterene-hydrochlorothiazide (MAXZIDE-25) 37.5-25 MG tablet TAKE ONE-HALF TO ONE TABLET BY MOUTH ONCE DAILY     Allergies  Allergen Reactions  . Adhesive [Tape]   . Benicar [Olmesartan]   . Penicillins Rash    Has patient had a PCN reaction causing immediate rash, facial/tongue/throat swelling, SOB or lightheadedness with hypotension: Yes Has patient had a PCN reaction causing severe rash involving mucus membranes or skin necrosis: No Has patient had a PCN reaction that required hospitalization No Has patient had a PCN reaction occurring within the last 10 years: No If all of the above answers are "NO", then may proceed with Cephalosporin use.     Social History   Socioeconomic History  . Marital status: Married    Spouse name: Not on file  . Number of children: Not on file  . Years of education: Not on file  . Highest education level: Not on file  Social Needs  . Financial resource strain: Not on file  . Food insecurity - worry: Not on file  . Food insecurity - inability: Not on file  . Transportation needs - medical: Not on file  . Transportation needs - non-medical: Not on file  Occupational History  . Not on file  Tobacco Use  . Smoking status: Never Smoker  . Smokeless tobacco: Never Used  Substance and Sexual Activity  . Alcohol use: No  . Drug use: No  . Sexual activity: No  Other Topics Concern  . Not on file  Social History Narrative  . Not on file  Review of Systems: General: negative for chills, fever, night sweats or weight changes.  Cardiovascular: negative for chest pain, dyspnea on exertion, edema, orthopnea, palpitations, paroxysmal nocturnal dyspnea or shortness of breath Dermatological: negative for rash Respiratory: negative for cough or wheezing Urologic: negative for hematuria Abdominal: negative for nausea, vomiting, diarrhea, bright red blood per rectum, melena, or hematemesis Neurologic: negative for visual changes,  syncope, or dizziness All other systems reviewed and are otherwise negative except as noted above.    Blood pressure 126/62, pulse (!) 44, height 5\' 6"  (1.676 m), weight 143 lb (64.9 kg).  General appearance: alert and no distress Neck: no adenopathy, no carotid bruit, no JVD, supple, symmetrical, trachea midline and thyroid not enlarged, symmetric, no tenderness/mass/nodules Lungs: clear to auscultation bilaterally Heart: regular rate and rhythm, S1, S2 normal, no murmur, click, rub or gallop Extremities: extremities normal, atraumatic, no cyanosis or edema Pulses: 2+ and symmetric Skin: Skin color, texture, turgor normal. No rashes or lesions Neurologic: Alert and oriented X 3, normal strength and tone. Normal symmetric reflexes. Normal coordination and gait  EKG sinus bradycardia at 44 without ST or T-wave changes. I personally reviewed this EKG.  ASSESSMENT AND PLAN:   Hypertension History of essential hypertension the blood pressure measures 126/62. She is on low-dose atenolol and Maxzide. Continue current meds at current dosing  Bradycardia History of chronic bradycardia with heart rates in the 40s on low-dose beta blocker. She simply asymptomatic. This is a similar reading that she had last year.      Lorretta Harp MD FACP,FACC,FAHA, [facility] 12/04/2017 11:07 AM

## 2017-12-04 NOTE — Patient Instructions (Signed)

## 2017-12-10 ENCOUNTER — Other Ambulatory Visit: Payer: Self-pay | Admitting: Internal Medicine

## 2017-12-17 ENCOUNTER — Other Ambulatory Visit: Payer: Self-pay | Admitting: Internal Medicine

## 2017-12-17 DIAGNOSIS — E559 Vitamin D deficiency, unspecified: Secondary | ICD-10-CM

## 2017-12-17 DIAGNOSIS — Z0000 Encounter for general adult medical examination without abnormal findings: Secondary | ICD-10-CM

## 2017-12-17 DIAGNOSIS — Z1321 Encounter for screening for nutritional disorder: Secondary | ICD-10-CM

## 2017-12-17 DIAGNOSIS — I10 Essential (primary) hypertension: Secondary | ICD-10-CM

## 2017-12-17 DIAGNOSIS — Z1329 Encounter for screening for other suspected endocrine disorder: Secondary | ICD-10-CM

## 2017-12-17 DIAGNOSIS — R7302 Impaired glucose tolerance (oral): Secondary | ICD-10-CM

## 2017-12-27 ENCOUNTER — Ambulatory Visit (INDEPENDENT_AMBULATORY_CARE_PROVIDER_SITE_OTHER): Payer: Medicare Other | Admitting: Internal Medicine

## 2017-12-27 ENCOUNTER — Encounter: Payer: Self-pay | Admitting: Internal Medicine

## 2017-12-27 VITALS — BP 120/62 | HR 55 | Ht 63.0 in | Wt 140.0 lb

## 2017-12-27 DIAGNOSIS — M810 Age-related osteoporosis without current pathological fracture: Secondary | ICD-10-CM | POA: Diagnosis not present

## 2017-12-27 DIAGNOSIS — R001 Bradycardia, unspecified: Secondary | ICD-10-CM | POA: Diagnosis not present

## 2017-12-27 DIAGNOSIS — I878 Other specified disorders of veins: Secondary | ICD-10-CM

## 2017-12-27 DIAGNOSIS — I493 Ventricular premature depolarization: Secondary | ICD-10-CM

## 2017-12-27 DIAGNOSIS — R413 Other amnesia: Secondary | ICD-10-CM

## 2017-12-27 DIAGNOSIS — R8290 Unspecified abnormal findings in urine: Secondary | ICD-10-CM

## 2017-12-27 DIAGNOSIS — K219 Gastro-esophageal reflux disease without esophagitis: Secondary | ICD-10-CM

## 2017-12-27 DIAGNOSIS — Z1329 Encounter for screening for other suspected endocrine disorder: Secondary | ICD-10-CM

## 2017-12-27 DIAGNOSIS — L409 Psoriasis, unspecified: Secondary | ICD-10-CM

## 2017-12-27 DIAGNOSIS — Z0000 Encounter for general adult medical examination without abnormal findings: Secondary | ICD-10-CM | POA: Diagnosis not present

## 2017-12-27 DIAGNOSIS — E782 Mixed hyperlipidemia: Secondary | ICD-10-CM

## 2017-12-27 DIAGNOSIS — Z1321 Encounter for screening for nutritional disorder: Secondary | ICD-10-CM

## 2017-12-27 DIAGNOSIS — I10 Essential (primary) hypertension: Secondary | ICD-10-CM | POA: Diagnosis not present

## 2017-12-27 DIAGNOSIS — R7302 Impaired glucose tolerance (oral): Secondary | ICD-10-CM | POA: Diagnosis not present

## 2017-12-27 DIAGNOSIS — E559 Vitamin D deficiency, unspecified: Secondary | ICD-10-CM | POA: Diagnosis not present

## 2017-12-27 LAB — POCT URINALYSIS DIPSTICK
APPEARANCE: ABNORMAL
Bilirubin, UA: NEGATIVE
Glucose, UA: NEGATIVE
Ketones, UA: NEGATIVE
NITRITE UA: NEGATIVE
Odor: ABNORMAL
PH UA: 6 (ref 5.0–8.0)
PROTEIN UA: NEGATIVE
Spec Grav, UA: 1.015 (ref 1.010–1.025)
UROBILINOGEN UA: 0.2 U/dL

## 2017-12-27 MED ORDER — DONEPEZIL HCL 5 MG PO TABS: 5.0000 mg | ORAL_TABLET | Freq: Every day | ORAL | 5 refills | Status: DC

## 2017-12-27 MED ORDER — FLUOCINONIDE 0.05 % EX CREA: 1.0000 "application " | TOPICAL_CREAM | Freq: Two times a day (BID) | CUTANEOUS | 0 refills | Status: DC

## 2017-12-27 NOTE — Progress Notes (Signed)
Subjective:    Patient ID: Donna Woods, female    DOB: May 16, 1934, 84 y.o.   MRN: [id number]  HPI 84 year old Female for health maintenance exam and evaluation of medical issues.  She had her husband reside alone.  He recently suffered an ankle fracture.  Her daughter tells me that her memory is much worse.  Issues with memory started when she was hospitalized in December 2010 with a meningitis type process.  She presented with a headache.  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  It was thought she had a viral syndrome.  She was discharged from the hospital but her condition worsened and she returned with increasing confusion headache and meningismus.  She was placed on antibiotics after which she improved.  She was taken off antibiotics and worsened within 48 hours.  MRI showed a proteinaceous exudate on the surface of the brain right side greater than left.  She had an infectious disease consultation.  Antibiotic coverage was continued.  She improved slowly.  She went to a rehab facility but has had significant memory impairment and  seem to develop early dementia around that time.  Short-term memory is a real issue.  She has a gait disorder and at times requires some assistance with ambulation.  Sometimes tends to fall backwards.  She had a knee  arthroplasty in 2005.  Total hysterectomy approximately 1968.  Cholecystectomy 1969.  History of seasonal allergies arthritis and sometimes palpitations but has not complained of that recently.  Social history: Non-smoker.  No alcohol consumption.  She is married.  She is a retired Radio producer.  2 adult daughters.  Husband is supportive.  Family history: Father died at age 48 of respiratory failure.  Mother died at age 8 of neurological disease.  One brother with history of neurological disorder (CMT disease).  Another brother in good health.  Sister in good health.  Daughter with hypertension and glucose intolerance.  Another  daughter in good health.    She has a history of GE reflux, osteoporosis, fibromyalgia type pain.  History of vitamin D deficiency.  History of chronic venous stasis and stasis dermatitis.  Had left inguinal hernia repair by Dr. Mia Creek in [facility] November 2013.  She had right knee arthroplasty by Dr. Durward Fortes January 2017.  She is seeing Dr. Alvester Chou for cardiology issues.  She has a history of asymptomatic bradycardia.  History of essential hypertension and PVCs.  History of hyperlipidemia.  She had a stress test in 2010 which showed no ischemia.  PVCs are controllable with beta-blocker.  She denies chest pain or shortness of breath.        Review of Systems see above     Objective:   Physical Exam  Constitutional: She appears well-developed and well-nourished.  HENT:  Head: Normocephalic and atraumatic.  Right Ear: External ear normal.  Left Ear: External ear normal.  Mouth/Throat: Oropharynx is clear and moist.  Eyes: Conjunctivae and EOM are normal. Pupils are equal, round, and reactive to light.  Neck: Neck supple. No JVD present. No thyromegaly present.  Cardiovascular: Normal rate, regular rhythm, normal heart sounds and intact distal pulses.  Pulmonary/Chest: Effort normal and breath sounds normal. No respiratory distress. She has no wheezes. She has no rales.  Abdominal: Soft. Bowel sounds are normal. She exhibits no distension and no mass. There is no tenderness. There is no rebound and no guarding.  Genitourinary:  Genitourinary Comments: Deferred status post hysterectomy  Musculoskeletal: She exhibits no edema.  Lymphadenopathy:    She has no cervical adenopathy.  Neurological: She is alert. She has normal reflexes. No cranial nerve deficit.  Skin: Skin is warm and dry. No rash noted.  Psychiatric: She has a normal mood and affect.  Vitals reviewed.         Assessment & Plan:  Memory loss-this seems much worse than last year.  I am starting her on Aricept  and want to follow-up with her in 6 months.  Consider neurology consultation for worsening of memory.  History of impaired glucose tolerance  Mixed hyperlipidemia  History of meningitis type process in 2010 with resultant memory loss  PVCs controlled with beta-blocker and has asymptomatic bradycardia  Osteoporosis  GE reflux  Allergic rhinitis  Essential hypertension  Chronic venous stasis and stasis dermatitis  Abnormal urinalysis-culture obtained  Rash on legs seems to be psoriatic.  Have prescribed triamcinolone cream  Plan: Start Aricept and follow-up in 6 months continue other medications as previously prescribed.  Discussed with daughter her situation.  Triamcinolone cream for rash on legs  Subjective:   Patient presents for Medicare Annual/Subsequent preventive examination.  Review Past Medical/Family/Social: See above   Risk Factors  Current exercise habits: Mostly about the house Dietary issues discussed: Weight is appropriate  Cardiac risk factors: Hyperlipidemia  Depression Screen  (Note: if answer to either of the following is "Yes", a more complete depression screening is indicated)   Over the past two weeks, have you felt down, depressed or hopeless? No  Over the past two weeks, have you felt little interest or pleasure in doing things? No Have you lost interest or pleasure in daily life? No Do you often feel hopeless? No Do you cry easily over simple problems? No   Activities of Daily Living  In your present state of health, do you have any difficulty performing the following activities?:   Driving? No  Managing money? No  Feeding yourself? No  Getting from bed to chair? No  Climbing a flight of stairs? No  Preparing food and eating?: No  Bathing or showering? No  Getting dressed: No  Getting to the toilet? No  Using the toilet:No  Moving around from place to place: No  In the past year have you fallen or had a near fall?:No  Are you  sexually active? No  Do you have more than one partner? No   Hearing Difficulties: No  Do you often ask people to speak up or repeat themselves? No  Do you experience ringing or noises in your ears?  Yes Do you have difficulty understanding soft or whispered voices? No  Do you feel that you have a problem with memory?  She answers "no" however  others would say she does feel she has memory issues Do you often misplace items? No    Home Safety:  Do you have a smoke alarm at your residence? Yes Do you have grab bars in the bathroom?  Yes Do you have throw rugs in your house?  Yes   Cognitive Testing -noticed that she has some difficulty following directions Alert? Yes Normal Appearance?Yes  Oriented to person? Yes Place? Yes  Time?  Not tested recall of three objects?  No Can perform simple calculations?  Not tested Displays appropriate judgment?Yes  Can read the correct time from a watch face?Yes   List the Names of Other Physician/Practitioners you currently use:  See referral list for the physicians patient is currently seeing.     Review of  Systems: See above   Objective:     General appearance: Appears stated age and slender Head: Normocephalic, without obvious abnormality, atraumatic  Eyes: conj clear, EOMi PEERLA  Ears: normal TM's and external ear canals both ears  Nose: Nares normal. Septum midline. Mucosa normal. No drainage or sinus tenderness.  Throat: lips, mucosa, and tongue normal; teeth and gums normal  Neck: no adenopathy, no carotid bruit, no JVD, supple, symmetrical, trachea midline and thyroid not enlarged, symmetric, no tenderness/mass/nodules  No CVA tenderness.  Lungs: clear to auscultation bilaterally  Breasts: normal appearance, no masses or tenderness Heart: regular rate and rhythm, S1, S2 normal, no murmur, click, rub or gallop  Abdomen: soft, non-tender; bowel sounds normal; no masses, no organomegaly  Musculoskeletal: ROM normal in all  joints, no crepitus, no deformity, Normal muscle strengthen. Back  is symmetric, no curvature. Skin: Skin color, texture, turgor normal. No rashes or lesions  Lymph nodes: Cervical, supraclavicular, and axillary nodes normal.  Neurologic: CN 2 -12 Normal, Normal symmetric reflexes. Normal coordination and gait  Psych: Alert & Oriented x 3, Mood appear stable.    Assessment:    Annual wellness medicare exam   Plan:    During the course of the visit the patient was educated and counseled about appropriate screening and preventive services including:  Annual flu vaccine      Patient Instructions (the written plan) was given to the patient.  Medicare Attestation  I have personally reviewed:  The patient's medical and social history  Their use of alcohol, tobacco or illicit drugs  Their current medications and supplements  The patient's functional ability including ADLs,fall risks, home safety risks, cognitive, and hearing and visual impairment  Diet and physical activities  Evidence for depression or mood disorders  The patient's weight, height, BMI, and visual acuity have been recorded in the chart. I have made referrals, counseling, and provided education to the patient based on review of the above and I have provided the patient with a written personalized care plan for preventive services.

## 2017-12-28 LAB — HEMOGLOBIN A1C
EAG (MMOL/L): 6.6 (calc)
Hgb A1c MFr Bld: 5.8 % of total Hgb — ABNORMAL HIGH (ref <5.7)
Mean Plasma Glucose: 120 (calc)

## 2017-12-28 LAB — COMPLETE METABOLIC PANEL WITH GFR
AG Ratio: 2 (calc) (ref 1.0–2.5)
ALKALINE PHOSPHATASE (APISO): 56 U/L (ref 33–130)
ALT: 11 U/L (ref 6–29)
AST: 7 U/L — ABNORMAL LOW (ref 10–35)
Albumin: 4.5 g/dL (ref 3.6–5.1)
BILIRUBIN TOTAL: 0.9 mg/dL (ref 0.2–1.2)
BUN/Creatinine Ratio: 28 (calc) — ABNORMAL HIGH (ref 6–22)
BUN: 30 mg/dL — ABNORMAL HIGH (ref 7–25)
CHLORIDE: 106 mmol/L (ref 98–110)
CO2: 25 mmol/L (ref 20–32)
CREATININE: 1.08 mg/dL — AB (ref 0.60–0.88)
Calcium: 9.7 mg/dL (ref 8.6–10.4)
GFR, Est African American: 56 mL/min/{1.73_m2} — ABNORMAL LOW (ref >=60)
GFR, Est Non African American: 48 mL/min/{1.73_m2} — ABNORMAL LOW (ref >=60)
GLUCOSE: 83 mg/dL (ref 65–99)
Globulin: 2.2 g/dL (calc) (ref 1.9–3.7)
Potassium: 3.3 mmol/L — ABNORMAL LOW (ref 3.5–5.3)
Sodium: 140 mmol/L (ref 135–146)
TOTAL PROTEIN: 6.7 g/dL (ref 6.1–8.1)

## 2017-12-28 LAB — LIPID PANEL
Cholesterol: 173 mg/dL (ref <200)
HDL: 64 mg/dL (ref >50)
LDL CHOLESTEROL (CALC): 94 mg/dL
NON-HDL CHOLESTEROL (CALC): 109 mg/dL (ref <130)
TRIGLYCERIDES: 63 mg/dL (ref <150)
Total CHOL/HDL Ratio: 2.7 (calc) (ref <5.0)

## 2017-12-28 LAB — CBC WITH DIFFERENTIAL/PLATELET
BASOS PCT: 1.1 %
Basophils Absolute: 92 cells/uL (ref 0–200)
EOS PCT: 2 %
Eosinophils Absolute: 168 cells/uL (ref 15–500)
HCT: 39.5 % (ref 35.0–45.0)
HEMOGLOBIN: 13.6 g/dL (ref 11.7–15.5)
LYMPHS ABS: 1680 {cells}/uL (ref 850–3900)
MCH: 32.5 pg (ref 27.0–33.0)
MCHC: 34.4 g/dL (ref 32.0–36.0)
MCV: 94.3 fL (ref 80.0–100.0)
MPV: 11.5 fL (ref 7.5–12.5)
Monocytes Relative: 8.1 %
NEUTROS ABS: 5779 {cells}/uL (ref 1500–7800)
Neutrophils Relative %: 68.8 %
Platelets: 208 10*3/uL (ref 140–400)
RBC: 4.19 10*6/uL (ref 3.80–5.10)
RDW: 12.1 % (ref 11.0–15.0)
Total Lymphocyte: 20 %
WBC mixed population: 680 cells/uL (ref 200–950)
WBC: 8.4 10*3/uL (ref 3.8–10.8)

## 2017-12-28 LAB — VITAMIN D 25 HYDROXY (VIT D DEFICIENCY, FRACTURES): Vit D, 25-Hydroxy: 66 ng/mL (ref 30–100)

## 2017-12-28 LAB — TSH: TSH: 2.01 mIU/L (ref 0.40–4.50)

## 2017-12-28 LAB — VITAMIN B12

## 2017-12-30 ENCOUNTER — Other Ambulatory Visit: Payer: Self-pay

## 2017-12-30 LAB — URINE CULTURE
MICRO NUMBER: 90140921
SPECIMEN QUALITY:: ADEQUATE

## 2017-12-30 MED ORDER — CIPROFLOXACIN HCL 250 MG PO TABS: 250.0000 mg | ORAL_TABLET | Freq: Two times a day (BID) | ORAL | 0 refills | Status: DC

## 2018-01-13 ENCOUNTER — Encounter: Payer: Medicare Other | Admitting: Internal Medicine

## 2018-01-18 NOTE — Patient Instructions (Addendum)
Begin Aricept 5 mg daily and follow-up in 6 months.  Continue same medications.  Triamcinolone cream to rash on legs

## 2018-01-21 ENCOUNTER — Encounter: Payer: Self-pay | Admitting: Internal Medicine

## 2018-01-21 DIAGNOSIS — Z1231 Encounter for screening mammogram for malignant neoplasm of breast: Secondary | ICD-10-CM | POA: Diagnosis not present

## 2018-03-08 ENCOUNTER — Other Ambulatory Visit: Payer: Self-pay | Admitting: Cardiovascular Disease

## 2018-03-10 NOTE — Telephone Encounter (Signed)
Rx has been sent to the pharmacy electronically. ° °

## 2018-04-07 ENCOUNTER — Other Ambulatory Visit: Payer: Self-pay | Admitting: Internal Medicine

## 2018-04-30 ENCOUNTER — Other Ambulatory Visit: Payer: Self-pay | Admitting: Cardiovascular Disease

## 2018-04-30 MED ORDER — ATENOLOL 25 MG PO TABS: 12.5000 mg | ORAL_TABLET | Freq: Every day | ORAL | 3 refills | Status: DC

## 2018-06-16 ENCOUNTER — Other Ambulatory Visit: Payer: Self-pay | Admitting: Internal Medicine

## 2018-06-16 DIAGNOSIS — Z1329 Encounter for screening for other suspected endocrine disorder: Secondary | ICD-10-CM

## 2018-06-16 DIAGNOSIS — Z0000 Encounter for general adult medical examination without abnormal findings: Secondary | ICD-10-CM

## 2018-06-16 DIAGNOSIS — I10 Essential (primary) hypertension: Secondary | ICD-10-CM

## 2018-06-16 DIAGNOSIS — R7302 Impaired glucose tolerance (oral): Secondary | ICD-10-CM

## 2018-06-20 ENCOUNTER — Other Ambulatory Visit: Payer: Medicare Other | Admitting: Internal Medicine

## 2018-06-20 ENCOUNTER — Encounter: Payer: Self-pay | Admitting: Internal Medicine

## 2018-06-20 ENCOUNTER — Telehealth: Payer: Self-pay | Admitting: Internal Medicine

## 2018-06-20 DIAGNOSIS — R7302 Impaired glucose tolerance (oral): Secondary | ICD-10-CM

## 2018-06-20 DIAGNOSIS — Z0000 Encounter for general adult medical examination without abnormal findings: Secondary | ICD-10-CM

## 2018-06-20 DIAGNOSIS — Z1329 Encounter for screening for other suspected endocrine disorder: Secondary | ICD-10-CM | POA: Diagnosis not present

## 2018-06-20 DIAGNOSIS — I10 Essential (primary) hypertension: Secondary | ICD-10-CM | POA: Diagnosis not present

## 2018-06-20 MED ORDER — LIDOCAINE 5 % EX PTCH: MEDICATED_PATCH | CUTANEOUS | 3 refills | Status: DC

## 2018-06-20 NOTE — Telephone Encounter (Addendum)
Patient states that she needs a refill on her Lidocaine 5% patches please.    Pharmacy:  [facility] in [place].  She is coming on Monday for her CPE.      Thank you.     Rx requested has been e-scribed

## 2018-06-20 NOTE — Telephone Encounter (Signed)
A prior authorization has been started.

## 2018-06-21 LAB — COMPLETE METABOLIC PANEL WITH GFR
AG Ratio: 1.9 (calc) (ref 1.0–2.5)
ALBUMIN MSPROF: 4.4 g/dL (ref 3.6–5.1)
ALT: 9 U/L (ref 6–29)
AST: 7 U/L — ABNORMAL LOW (ref 10–35)
Alkaline phosphatase (APISO): 50 U/L (ref 33–130)
BUN / CREAT RATIO: 27 (calc) — AB (ref 6–22)
BUN: 30 mg/dL — ABNORMAL HIGH (ref 7–25)
CALCIUM: 9.8 mg/dL (ref 8.6–10.4)
CO2: 28 mmol/L (ref 20–32)
CREATININE: 1.13 mg/dL — AB (ref 0.60–0.88)
Chloride: 104 mmol/L (ref 98–110)
GFR, EST AFRICAN AMERICAN: 53 mL/min/{1.73_m2} — AB (ref >=60)
GFR, EST NON AFRICAN AMERICAN: 46 mL/min/{1.73_m2} — AB (ref >=60)
Globulin: 2.3 g/dL (calc) (ref 1.9–3.7)
Glucose, Bld: 95 mg/dL (ref 65–99)
Potassium: 3.8 mmol/L (ref 3.5–5.3)
Sodium: 142 mmol/L (ref 135–146)
TOTAL PROTEIN: 6.7 g/dL (ref 6.1–8.1)
Total Bilirubin: 0.6 mg/dL (ref 0.2–1.2)

## 2018-06-21 LAB — CBC WITH DIFFERENTIAL/PLATELET
BASOS ABS: 73 {cells}/uL (ref 0–200)
Basophils Relative: 0.9 %
Eosinophils Absolute: 373 cells/uL (ref 15–500)
Eosinophils Relative: 4.6 %
HEMATOCRIT: 35.9 % (ref 35.0–45.0)
HEMOGLOBIN: 12.6 g/dL (ref 11.7–15.5)
LYMPHS ABS: 1523 {cells}/uL (ref 850–3900)
MCH: 32.7 pg (ref 27.0–33.0)
MCHC: 35.1 g/dL (ref 32.0–36.0)
MCV: 93.2 fL (ref 80.0–100.0)
MPV: 11 fL (ref 7.5–12.5)
Monocytes Relative: 8.3 %
NEUTROS PCT: 67.4 %
Neutro Abs: 5459 cells/uL (ref 1500–7800)
Platelets: 212 10*3/uL (ref 140–400)
RBC: 3.85 10*6/uL (ref 3.80–5.10)
RDW: 11.8 % (ref 11.0–15.0)
Total Lymphocyte: 18.8 %
WBC: 8.1 10*3/uL (ref 3.8–10.8)
WBCMIX: 672 {cells}/uL (ref 200–950)

## 2018-06-21 LAB — LIPID PANEL
Cholesterol: 169 mg/dL (ref <200)
HDL: 52 mg/dL (ref >50)
LDL Cholesterol (Calc): 97 mg/dL (calc)
Non-HDL Cholesterol (Calc): 117 mg/dL (calc) (ref <130)
TRIGLYCERIDES: 107 mg/dL (ref <150)
Total CHOL/HDL Ratio: 3.3 (calc) (ref <5.0)

## 2018-06-21 LAB — TSH: TSH: 2.54 m[IU]/L (ref 0.40–4.50)

## 2018-06-21 LAB — HEMOGLOBIN A1C
EAG (MMOL/L): 6.6 (calc)
HEMOGLOBIN A1C: 5.8 %{Hb} — AB (ref <5.7)
MEAN PLASMA GLUCOSE: 120 (calc)

## 2018-06-21 LAB — MICROALBUMIN / CREATININE URINE RATIO
Creatinine, Urine: 102 mg/dL (ref 20–275)
MICROALB/CREAT RATIO: 35 ug/mg{creat} — AB (ref <30)
Microalb, Ur: 3.6 mg/dL

## 2018-06-23 ENCOUNTER — Encounter: Payer: Self-pay | Admitting: Internal Medicine

## 2018-06-23 ENCOUNTER — Ambulatory Visit (INDEPENDENT_AMBULATORY_CARE_PROVIDER_SITE_OTHER): Payer: Medicare Other | Admitting: Internal Medicine

## 2018-06-23 VITALS — BP 100/80 | HR 45 | Ht 63.0 in | Wt 144.0 lb

## 2018-06-23 DIAGNOSIS — R413 Other amnesia: Secondary | ICD-10-CM | POA: Diagnosis not present

## 2018-06-23 DIAGNOSIS — Z1211 Encounter for screening for malignant neoplasm of colon: Secondary | ICD-10-CM

## 2018-06-23 DIAGNOSIS — M810 Age-related osteoporosis without current pathological fracture: Secondary | ICD-10-CM | POA: Diagnosis not present

## 2018-06-23 DIAGNOSIS — I878 Other specified disorders of veins: Secondary | ICD-10-CM | POA: Diagnosis not present

## 2018-06-23 DIAGNOSIS — I493 Ventricular premature depolarization: Secondary | ICD-10-CM | POA: Diagnosis not present

## 2018-06-23 DIAGNOSIS — R001 Bradycardia, unspecified: Secondary | ICD-10-CM | POA: Diagnosis not present

## 2018-06-23 DIAGNOSIS — R7302 Impaired glucose tolerance (oral): Secondary | ICD-10-CM | POA: Diagnosis not present

## 2018-06-23 DIAGNOSIS — E782 Mixed hyperlipidemia: Secondary | ICD-10-CM

## 2018-06-23 DIAGNOSIS — K219 Gastro-esophageal reflux disease without esophagitis: Secondary | ICD-10-CM | POA: Diagnosis not present

## 2018-06-23 DIAGNOSIS — R8290 Unspecified abnormal findings in urine: Secondary | ICD-10-CM

## 2018-06-23 DIAGNOSIS — I10 Essential (primary) hypertension: Secondary | ICD-10-CM

## 2018-06-23 DIAGNOSIS — Z0000 Encounter for general adult medical examination without abnormal findings: Secondary | ICD-10-CM | POA: Diagnosis not present

## 2018-06-23 LAB — POCT URINALYSIS DIPSTICK
Bilirubin, UA: NEGATIVE
Glucose, UA: NEGATIVE
KETONES UA: NEGATIVE
Nitrite, UA: POSITIVE
PROTEIN UA: POSITIVE — AB
SPEC GRAV UA: 1.015 (ref 1.010–1.025)
Urobilinogen, UA: 0.2 E.U./dL
pH, UA: 6 (ref 5.0–8.0)

## 2018-06-23 LAB — HEMOCCULT GUIAC POC 1CARD (OFFICE): FECAL OCCULT BLD: NEGATIVE

## 2018-06-23 NOTE — Telephone Encounter (Signed)
Did you do it through medicare?

## 2018-06-23 NOTE — Progress Notes (Signed)
Subjective:    Patient ID: Donna Woods, female    DOB: Dec 12, 1933, 84 y.o.   MRN: [id number]  HPI 84 year old Female for Medicare Wellness, routine health maintenance and evaluation of medical issues.  Pt has memory loss dating back to December 2010 when she was hospitalized with a meningitis type process.  She presented with a headache.  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  It was thought she had a viral syndrome.  She was discharged from the hospital but her condition worsened and she returned with increasing confusion, headache and meningismus.  She was placed on antibiotics after which she improved.  She was taken off antibiotics and worsened within 48 hours.  MRI showed a proteinaceous exudate on the surface of her brain right side greater than left.  She had an infectious disease consultation.  Antibiotic coverage was continued.  She slowly improved and went to a rehab facility.  Short-term memory remains a problem.  She has a tendency to fall backwards at times.  She and her husband reside alone and is becoming increasingly difficult for him to take care of her.  She gets confused.  Spoke with her daughter today about this and she would like to have patient evaluated by Neurologist.  Is been sometime since she has been seen by neurologist and she is on Aricept but she thinks is a sleeping pill and sometimes will not take it.  Patient is cooperative today and alert.  She realizes it Trump is the president but not oriented to day of the week or month.  She is a former Radio producer.  Non-smoker.  No alcohol consumption.  2 adult daughters.  Husband is supportive but his health is not as good as it used to be.  Family history: Father died at age 66 respiratory failure.  Mother died at 25 of neurological disease.  One brother with history of CMT disease.  Another brother in good health.  Sister in good health.  Daughter with hypertension and glucose  intolerance.  Patient has GE reflux, osteoporosis, vitamin D deficiency, chronic venous stasis and stasis dermatitis.  We are going to ask dermatologist to see her regarding stasis dermatitis.  She has a history of hypertension.  Left inguinal hernia repair by Dr. Mia Creek in [place] in  2013.  History of knee replacement 2005.  Right knee replacement by Dr. Durward Fortes January 2017.  Total hysterectomy approximately 1968.  Cholecystectomy 1969.  History of seasonal allergies, arthritis and palpitations.  With regard to her memory, it seems  almost exactly the same as it was in December 2017.  She did not know the day of the week and did know the president at that time but did not know the month.   No new no new complaints Review of Systems     Objective:   Physical Exam  Constitutional: She is oriented to person, place, and time. She appears well-developed and well-nourished. No distress.  HENT:  Head: Normocephalic and atraumatic.  Right Ear: External ear normal.  Left Ear: External ear normal.  Mouth/Throat: Oropharynx is clear and moist.  Eyes: Right eye exhibits no discharge. Left eye exhibits no discharge. No scleral icterus.  Neck: No JVD present. No thyromegaly present.  Cardiovascular: Normal rate and intact distal pulses.  Infrequent irregular contractions  Pulmonary/Chest: Effort normal and breath sounds normal. No stridor. No respiratory distress. She has no wheezes. She has no rales.  Abdominal: Soft. She exhibits no distension and no mass. There  is no tenderness. There is no guarding.  Genitourinary:  Genitourinary Comments:  bimanual normal.  Stool was guaiac negative  Musculoskeletal: She exhibits no edema or deformity.  Lymphadenopathy:    She has no cervical adenopathy.  Neurological: She is alert and oriented to person, place, and time. She displays normal reflexes. No cranial nerve deficit. Coordination normal.  Skin: Skin is warm and dry. She is not diaphoretic.   Psychiatric: She has a normal mood and affect. Her behavior is normal. Judgment and thought content normal.  Vitals reviewed.         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  She is followed by Dr. Gwenlyn Found, cardiologist.  I will not change beta-blocker at this time.  I do not think is contributing to her memory loss.  Memory loss-see information above regarding hospitalization with meningitis type process which started her memory loss issues.  She has had TSH B12 and folate levels checked in the past.  TSH today is within normal limits.  Will arrange for neurology consultation his daughter would like to have her seen by a neurologist.  She is on Aricept but frequently does not take it because she thinks it is a sleeping pill according to her daughter.  History of impaired glucose tolerance  Allergic rhinitis  Osteoporosis  GE reflux  Chronic venous stasis and stasis dermatitis-would like for her to see a dermatologist.  I think she could benefit from Unna boots to heal these areas of dermatitis.  She does not have any open draining lesions but easily could develop that in the lower extremities.  Essential hypertension.  Plan: Continue same medications and return in 1 year or as needed. She has mild elevation of creatinine at 1.13.  Hemoglobin A1c is 5.8%.  TSH is normal.  Lipid panel is normal.  CBC is normal.  Subjective:   Patient presents for Medicare Annual/Subsequent preventive examination.  Review Past Medical/Family/Social: See above  Risk Factors  Current exercise habits: Sedentary Dietary issues discussed: No  Cardiac risk factors: Hyperlipidemia  Depression Screen  (Note: if answer to either of the following is "Yes", a more complete depression screening is indicated)   Over the past two weeks, have you felt down, depressed or hopeless? No  Over the past two weeks, have you felt little  interest or pleasure in doing things? No Have you lost interest or pleasure in daily life? No Do you often feel hopeless? No Do you cry easily over simple problems? No   Activities of Daily Living  In your present state of health, do you have any difficulty performing the following activities?:   Driving? No  Managing money? No  Feeding yourself? No  Getting from bed to chair? No  Climbing a flight of stairs? No  Preparing food and eating?: No  Bathing or showering? No  Getting dressed: No  Getting to the toilet? No  Using the toilet:No  Moving around from place to place: No  In the past year have you fallen or had a near fall?:No  Are you sexually active? No  Do you have more than one partner? No   Hearing Difficulties: No  Do you often ask people to speak up or repeat themselves? No  Do you experience ringing or noises in your ears?  yes Do you have difficulty understanding soft or whispered voices?  Yes sometimes Do you feel that you have a problem with memory?  Yes Do you  often misplace items? No    Home Safety:  Do you have a smoke alarm at your residence? Yes Do you have grab bars in the bathroom?  No unanswered Do you have throw rugs in your house?  Yes   Cognitive Testing  Alert? Yes Normal Appearance?Yes  Oriented to person? Yes Place? Yes  Time?  no  Recall of three objects?  No Can perform simple calculations?  not tested  Displays appropriate judgment?Yes  Can read the correct time from a watch face?  Not tested  Knows the name of the president but not month or day of week List the Names of Other Physician/Practitioners you currently use:  See referral list for the physicians patient is currently seeing.     Review of Systems: See above   Objective:     General appearance: Appears stated age and thin Head: Normocephalic, without obvious abnormality, atraumatic  Eyes: conj clear, EOMi PEERLA  Ears: normal TM's and external ear canals both ears   Nose: Nares normal. Septum midline. Mucosa normal. No drainage or sinus tenderness.  Throat: lips, mucosa, and tongue normal; teeth and gums normal  Neck: no adenopathy, no carotid bruit, no JVD, supple, symmetrical, trachea midline and thyroid not enlarged, symmetric, no tenderness/mass/nodules  No CVA tenderness.  Lungs: clear to auscultation bilaterally  Breasts: normal appearance, no masses or tenderness Heart: regular rate and rhythm, S1, S2 normal, no murmur, click, rub or gallop  Abdomen: soft, non-tender; bowel sounds normal; no masses, no organomegaly  Musculoskeletal: ROM normal in all joints, no crepitus, no deformity, Normal muscle strengthen. Back  is symmetric, no curvature. Skin: Skin color, texture, turgor normal. No rashes or lesions  Lymph nodes: Cervical, supraclavicular, and axillary nodes normal.  Neurologic: CN 2 -12 Normal, Normal symmetric reflexes. Normal coordination and gait  Psych: Alert & Oriented x 3, Mood appear stable.    Assessment:    Annual wellness medicare exam   Plan:    During the course of the visit the patient was educated and counseled about appropriate screening and preventive services including:   Annual flu vaccine     Patient Instructions (the written plan) was given to the patient.  Medicare Attestation  I have personally reviewed:  The patient's medical and social history  Their use of alcohol, tobacco or illicit drugs  Their current medications and supplements  The patient's functional ability including ADLs,fall risks, home safety risks, cognitive, and hearing and visual impairment  Diet and physical activities  Evidence for depression or mood disorders  The patient's weight, height, BMI, and visual acuity have been recorded in the chart. I have made referrals, counseling, and provided education to the patient based on review of the above and I have provided the patient with a written personalized care plan for preventive  services.

## 2018-06-23 NOTE — Telephone Encounter (Signed)
Cover my meds

## 2018-06-23 NOTE — Patient Instructions (Signed)
Arrange for neurology and dermatology consultations.  Otherwise return in 1 year or as needed.  Bradycardia is asymptomatic as best I can tell and suppress his PVCs.  She is only on low-dose Tenormin 12.5 mg daily.

## 2018-06-25 LAB — URINE CULTURE
MICRO NUMBER:: 90893265
SPECIMEN QUALITY:: ADEQUATE

## 2018-06-26 ENCOUNTER — Other Ambulatory Visit: Payer: Self-pay

## 2018-06-26 MED ORDER — NITROFURANTOIN MONOHYD MACRO 100 MG PO CAPS: 100.0000 mg | ORAL_CAPSULE | Freq: Two times a day (BID) | ORAL | 0 refills | Status: DC

## 2018-06-30 DIAGNOSIS — I83003 Varicose veins of unspecified lower extremity with ulcer of ankle: Secondary | ICD-10-CM | POA: Diagnosis not present

## 2018-06-30 DIAGNOSIS — L97301 Non-pressure chronic ulcer of unspecified ankle limited to breakdown of skin: Secondary | ICD-10-CM | POA: Diagnosis not present

## 2018-07-09 ENCOUNTER — Other Ambulatory Visit: Payer: Self-pay | Admitting: *Deleted

## 2018-07-09 DIAGNOSIS — I739 Peripheral vascular disease, unspecified: Secondary | ICD-10-CM

## 2018-07-11 ENCOUNTER — Ambulatory Visit (HOSPITAL_COMMUNITY)
Admission: RE | Admit: 2018-07-11 | Payer: Medicare Other | Source: Ambulatory Visit | Attending: Cardiovascular Disease | Admitting: Cardiovascular Disease

## 2018-07-11 ENCOUNTER — Ambulatory Visit (HOSPITAL_COMMUNITY)
Admission: RE | Admit: 2018-07-11 | Discharge: 2018-07-11 | Disposition: A | Discharge status: Home / Self Care | Payer: Medicare Other | Source: Ambulatory Visit | Attending: Cardiology | Admitting: Cardiology

## 2018-07-11 DIAGNOSIS — I739 Peripheral vascular disease, unspecified: Secondary | ICD-10-CM | POA: Insufficient documentation

## 2018-07-14 ENCOUNTER — Ambulatory Visit (INDEPENDENT_AMBULATORY_CARE_PROVIDER_SITE_OTHER): Payer: Medicare Other | Admitting: Internal Medicine

## 2018-07-14 ENCOUNTER — Encounter: Payer: Self-pay | Admitting: Internal Medicine

## 2018-07-14 VITALS — BP 110/68 | HR 45 | Temp 98.3°F | Ht 63.0 in | Wt 142.0 lb

## 2018-07-14 DIAGNOSIS — L97921 Non-pressure chronic ulcer of unspecified part of left lower leg limited to breakdown of skin: Secondary | ICD-10-CM | POA: Diagnosis not present

## 2018-07-14 DIAGNOSIS — L03116 Cellulitis of left lower limb: Secondary | ICD-10-CM | POA: Diagnosis not present

## 2018-07-14 MED ORDER — MUPIROCIN CALCIUM 2 % EX CREA: 1.0000 "application " | TOPICAL_CREAM | Freq: Two times a day (BID) | CUTANEOUS | 0 refills | Status: DC

## 2018-07-14 MED ORDER — MUPIROCIN 2 % EX OINT: 1.0000 "application " | TOPICAL_OINTMENT | Freq: Two times a day (BID) | CUTANEOUS | 0 refills | Status: DC

## 2018-07-14 MED ORDER — GABAPENTIN 300 MG PO CAPS: ORAL_CAPSULE | ORAL | 3 refills | Status: DC

## 2018-07-14 MED ORDER — DOXYCYCLINE HYCLATE 100 MG PO TABS: 100.0000 mg | ORAL_TABLET | Freq: Two times a day (BID) | ORAL | 0 refills | Status: DC

## 2018-07-14 MED ORDER — CEFTRIAXONE SODIUM 1 G IJ SOLR
1.0000 g | Freq: Once | INTRAMUSCULAR | Status: AC
  Administered 2018-07-14: 1 g via INTRAMUSCULAR

## 2018-07-14 NOTE — Progress Notes (Signed)
   Subjective:    Patient ID: Donna Woods, female    DOB: [birth year], 84 y.o.   MRN: [id number]  HPI Recently saw Dr. Delman Cheadle, Dermatologist regarding chronic stasis dermatitis with superficial ulcer. Santyl cream prescribed for 14 days. Now left leg very red for about a week with increased warmth. Cries at night with pain.She has a history of dementia. She and husband reside alone. Leg is very painful. She is having difficulty ambulating. CBC with diff drawn. Saw Cardiology last week for peripheral vascular studies but no occlusive disease. Daughter texted picture of leg today. Appointment made on urgent basis.    Review of Systems     Objective:   Physical Exam Medial leg has V shaped ulceration that has yellow exudate. Surrounding that leg is erythematous to knee. Ulcer is 3 cm long x o.5 to 1 cm in width.  Was cleaned with peroxide and Bactroban applied. Given one gram IM Rocphin.     Assessment & Plan:  Cellulitis Left leg with infected stasis ulcer.  Plan: Doxycycline 100 mg bid x 10 days. See if Plastic Surgery can evaluate. Continue dressings wioth Telfa and Bactroban. Follow up Friday.Gabapentin 300 mg hs for pain may increase to 600 mg if tolerated. Discuss with granddaughter who is here with them. Mother is an Therapist, sports but working today.

## 2018-07-14 NOTE — Patient Instructions (Signed)
Doxycycline 100 mg twice daily x 10 days. IM Rocephin given today. Gabapentin 300 mg hs may increase to 600 mg if tolerated for pain.

## 2018-07-15 ENCOUNTER — Ambulatory Visit (INDEPENDENT_AMBULATORY_CARE_PROVIDER_SITE_OTHER): Payer: Medicare Other | Admitting: Cardiovascular Disease

## 2018-07-15 ENCOUNTER — Encounter: Payer: Self-pay | Admitting: Cardiovascular Disease

## 2018-07-15 ENCOUNTER — Ambulatory Visit: Payer: Medicare Other | Admitting: Cardiovascular Disease

## 2018-07-15 VITALS — BP 112/66 | HR 44 | Ht 67.0 in | Wt 141.8 lb

## 2018-07-15 DIAGNOSIS — I739 Peripheral vascular disease, unspecified: Secondary | ICD-10-CM | POA: Diagnosis not present

## 2018-07-15 DIAGNOSIS — I878 Other specified disorders of veins: Secondary | ICD-10-CM

## 2018-07-15 NOTE — Progress Notes (Signed)
Thanks

## 2018-07-15 NOTE — Assessment & Plan Note (Signed)
Donna Woods is seen today as an add-on for evaluation of venous stasis dermatitis and cellulitis.  This apparently has been going on for several months but became worse over the last several weeks.  She had segmental pressures in our office 07/11/2018 that revealed normal ABIs with triphasic signal suggesting no evidence of arterial disease.  She saw Dr. Renold Genta yesterday who began her on doxycycline.  She clearly has venous stasis with superimposed cellulitis and will need referral to the wound care center for more aggressive therapy.

## 2018-07-15 NOTE — Progress Notes (Signed)
07/15/2018 Donna Woods   Aug 08, 1934  [id number]  Primary Physician Baxley, Cresenciano Lick, MD Primary Cardiologist: Lorretta Harp MD Garret Reddish, [place], [place]  HPI:  Donna Woods is a 84 y.o.  thin appearing, married, Caucasian female mother of 2, grandmother to 6 grandchildren who was formerly a patient of Dr. Janene Madeira remotely. I saw her  12/04/2017 she is accompanied by her daughter Levada Dy today.  She does have progressive dementia as well.. She has a history of palpitations related to asymptomatic PVCs, hypertension. She denies chest pain or shortness of breath. Her last stress test performed 5 years ago was nonischemic. She did have a poorly defined neurologic illness which affected her memory and spanned many months requiring prolonged hospitalization and antibiotic therapy which she has for the most part recovered from.saw her a year ago she is asymptomatic. She denies chest pain shortness of breath dizziness or presyncope.she was referred back for preoperative clearance before elective left total knee replacement by Dr. Durward Fortes.She had a Myoview stress test performed 10/19/15 which was low risk patient underwent total knee replacement without complication. Since I saw her a year ago she has remained asymptomatic. She is chronically bradycardic with heart rates in the 40s on low-dose beta blocker is asymptomatic from this.  Since I saw her 8 months ago she is developed venous stasis ulcers on both legs right greater than left with superimposed cellulitis.  She saw Dr.Mary Jenny Reichmann back in the office yesterday who began her on doxycycline.  Arterial Dopplers performed in our office 07/11/2018 showed normal ABIs and triphasic signals bilaterally.  Current Meds  Medication Sig  . albuterol (PROVENTIL HFA;VENTOLIN HFA) 108 (90 BASE) MCG/ACT inhaler Inhale 2 puffs into the lungs every 6 (six) hours as needed.  Marland Kitchen atenolol (TENORMIN) 25 MG tablet Take 0.5 tablets (12.5 mg total) by mouth  daily.  . Calcium-Magnesium-Vitamin D (CALCIUM MAGNESIUM PO) Take by mouth daily.  . collagenase (SANTYL) ointment Apply 1 application topically daily.  Marland Kitchen donepezil (ARICEPT) 5 MG tablet Take 1 tablet (5 mg total) by mouth at bedtime.  Marland Kitchen doxycycline (VIBRA-TABS) 100 MG tablet Take 1 tablet (100 mg total) by mouth 2 (two) times daily.  . fluocinonide cream (LIDEX) 1.09 % Apply 1 application topically 2 (two) times daily.  Marland Kitchen gabapentin (NEURONTIN) 300 MG capsule One or 2 capsules at night for pain  . lidocaine (LIDODERM) 5 % APPLY 1 TO 3 PATCHES FOR 12 HOURS OUT OF 24 HOURS AS DIRECTED  . Multiple Vitamin (MULTIVITAMIN) capsule Take 1 capsule by mouth daily.  . mupirocin ointment (BACTROBAN) 2 % Place 1 application into the nose 2 (two) times daily. PLEASE DISCONTINUE BACTROBAN CREAM AND DISPENSE OINTMENT.  Marland Kitchen nitrofurantoin, macrocrystal-monohydrate, (MACROBID) 100 MG capsule Take 1 capsule (100 mg total) by mouth 2 (two) times daily.  Marland Kitchen omeprazole (PRILOSEC) 20 MG capsule TAKE ONE CAPSULE BY MOUTH ONCE DAILY  . potassium chloride (MICRO-K) 10 MEQ CR capsule Take 10 mEq by mouth 2 (two) times daily.   Marland Kitchen triamterene-hydrochlorothiazide (MAXZIDE-25) 37.5-25 MG tablet TAKE ONE-HALF TO ONE TABLET BY MOUTH ONCE DAILY     Allergies  Allergen Reactions  . Adhesive [Tape]   . Benicar [Olmesartan]   . Penicillins Rash    Has patient had a PCN reaction causing immediate rash, facial/tongue/throat swelling, SOB or lightheadedness with hypotension: Yes Has patient had a PCN reaction causing severe rash involving mucus membranes or skin necrosis: No Has patient had a PCN reaction that required  hospitalization No Has patient had a PCN reaction occurring within the last 10 years: No If all of the above answers are "NO", then may proceed with Cephalosporin use.     Social History   Socioeconomic History  . Marital status: Married    Spouse name: Not on file  . Number of children: Not on file  .  Years of education: Not on file  . Highest education level: Not on file  Occupational History  . Not on file  Social Needs  . Financial resource strain: Not on file  . Food insecurity:    Worry: Not on file    Inability: Not on file  . Transportation needs:    Medical: Not on file    Non-medical: Not on file  Tobacco Use  . Smoking status: Never Smoker  . Smokeless tobacco: Never Used  Substance and Sexual Activity  . Alcohol use: No  . Drug use: No  . Sexual activity: Never  Lifestyle  . Physical activity:    Days per week: Not on file    Minutes per session: Not on file  . Stress: Not on file  Relationships  . Social connections:    Talks on phone: Not on file    Gets together: Not on file    Attends religious service: Not on file    Active member of club or organization: Not on file    Attends meetings of clubs or organizations: Not on file    Relationship status: Not on file  . Intimate partner violence:    Fear of current or ex partner: Not on file    Emotionally abused: Not on file    Physically abused: Not on file    Forced sexual activity: Not on file  Other Topics Concern  . Not on file  Social History Narrative  . Not on file     Review of Systems: General: negative for chills, fever, night sweats or weight changes.  Cardiovascular: negative for chest pain, dyspnea on exertion, edema, orthopnea, palpitations, paroxysmal nocturnal dyspnea or shortness of breath Dermatological: negative for rash Respiratory: negative for cough or wheezing Urologic: negative for hematuria Abdominal: negative for nausea, vomiting, diarrhea, bright red blood per rectum, melena, or hematemesis Neurologic: negative for visual changes, syncope, or dizziness All other systems reviewed and are otherwise negative except as noted above.    Blood pressure 112/66, pulse (!) 44, height 5\' 7"  (1.702 m), weight 141 lb 12.8 oz (64.3 kg).  General appearance: alert and no  distress Neck: no adenopathy, no carotid bruit, no JVD, supple, symmetrical, trachea midline and thyroid not enlarged, symmetric, no tenderness/mass/nodules Lungs: clear to auscultation bilaterally Heart: regular rate and rhythm, S1, S2 normal, no murmur, click, rub or gallop Extremities: Bilateral venous stasis ulcers with superimposed cellulitis Pulses: 2+ and symmetric Skin: Bilateral venous stasis ulcers with superimposed cellulitis Neurologic: Alert and oriented X 3, normal strength and tone. Normal symmetric reflexes. Normal coordination and gait  EKG sinus bradycardia at 44.  I personally reviewed this EKG.  ASSESSMENT AND PLAN:   Venous stasis Ms. Valade is seen today as an add-on for evaluation of venous stasis dermatitis and cellulitis.  This apparently has been going on for several months but became worse over the last several weeks.  She had segmental pressures in our office 07/11/2018 that revealed normal ABIs with triphasic signal suggesting no evidence of arterial disease.  She saw Dr. Renold Genta yesterday who began her on doxycycline.  She clearly has  venous stasis with superimposed cellulitis and will need referral to the wound care center for more aggressive therapy.      Lorretta Harp MD FACP,FACC,FAHA, [facility] 07/15/2018 1:57 PM

## 2018-07-15 NOTE — Patient Instructions (Signed)
Your physician recommends that you schedule a follow-up appointment in: [place]

## 2018-07-16 ENCOUNTER — Other Ambulatory Visit (HOSPITAL_COMMUNITY)
Admission: RE | Admit: 2018-07-16 | Discharge: 2018-07-16 | Disposition: A | Discharge status: Home / Self Care | Payer: Medicare Other | Source: Other Acute Inpatient Hospital | Attending: Physician Assistant | Admitting: Physician Assistant

## 2018-07-16 ENCOUNTER — Encounter (HOSPITAL_BASED_OUTPATIENT_CLINIC_OR_DEPARTMENT_OTHER): Payer: Medicare Other | Attending: Internal Medicine

## 2018-07-16 DIAGNOSIS — L97822 Non-pressure chronic ulcer of other part of left lower leg with fat layer exposed: Secondary | ICD-10-CM | POA: Diagnosis not present

## 2018-07-16 DIAGNOSIS — L97322 Non-pressure chronic ulcer of left ankle with fat layer exposed: Secondary | ICD-10-CM | POA: Diagnosis not present

## 2018-07-16 DIAGNOSIS — I872 Venous insufficiency (chronic) (peripheral): Secondary | ICD-10-CM | POA: Insufficient documentation

## 2018-07-16 DIAGNOSIS — I10 Essential (primary) hypertension: Secondary | ICD-10-CM | POA: Insufficient documentation

## 2018-07-16 DIAGNOSIS — L97312 Non-pressure chronic ulcer of right ankle with fat layer exposed: Secondary | ICD-10-CM | POA: Insufficient documentation

## 2018-07-16 DIAGNOSIS — L97812 Non-pressure chronic ulcer of other part of right lower leg with fat layer exposed: Secondary | ICD-10-CM | POA: Diagnosis not present

## 2018-07-16 DIAGNOSIS — F0390 Unspecified dementia without behavioral disturbance: Secondary | ICD-10-CM | POA: Insufficient documentation

## 2018-07-16 DIAGNOSIS — L97919 Non-pressure chronic ulcer of unspecified part of right lower leg with unspecified severity: Secondary | ICD-10-CM | POA: Diagnosis not present

## 2018-07-16 DIAGNOSIS — I739 Peripheral vascular disease, unspecified: Secondary | ICD-10-CM | POA: Insufficient documentation

## 2018-07-18 ENCOUNTER — Ambulatory Visit: Payer: Medicare Other | Admitting: Internal Medicine

## 2018-07-19 LAB — AEROBIC CULTURE W GRAM STAIN (SUPERFICIAL SPECIMEN)

## 2018-07-19 LAB — AEROBIC CULTURE  (SUPERFICIAL SPECIMEN)

## 2018-07-23 DIAGNOSIS — I10 Essential (primary) hypertension: Secondary | ICD-10-CM | POA: Diagnosis not present

## 2018-07-23 DIAGNOSIS — B965 Pseudomonas (aeruginosa) (mallei) (pseudomallei) as the cause of diseases classified elsewhere: Secondary | ICD-10-CM | POA: Diagnosis not present

## 2018-07-23 DIAGNOSIS — F0390 Unspecified dementia without behavioral disturbance: Secondary | ICD-10-CM | POA: Diagnosis not present

## 2018-07-23 DIAGNOSIS — L97322 Non-pressure chronic ulcer of left ankle with fat layer exposed: Secondary | ICD-10-CM | POA: Diagnosis not present

## 2018-07-23 DIAGNOSIS — L97312 Non-pressure chronic ulcer of right ankle with fat layer exposed: Secondary | ICD-10-CM | POA: Diagnosis not present

## 2018-07-23 DIAGNOSIS — I739 Peripheral vascular disease, unspecified: Secondary | ICD-10-CM | POA: Diagnosis not present

## 2018-07-23 DIAGNOSIS — R600 Localized edema: Secondary | ICD-10-CM | POA: Diagnosis not present

## 2018-07-23 DIAGNOSIS — I872 Venous insufficiency (chronic) (peripheral): Secondary | ICD-10-CM | POA: Diagnosis not present

## 2018-07-23 DIAGNOSIS — L97822 Non-pressure chronic ulcer of other part of left lower leg with fat layer exposed: Secondary | ICD-10-CM | POA: Diagnosis not present

## 2018-07-23 DIAGNOSIS — L97812 Non-pressure chronic ulcer of other part of right lower leg with fat layer exposed: Secondary | ICD-10-CM | POA: Diagnosis not present

## 2018-07-24 ENCOUNTER — Telehealth (HOSPITAL_COMMUNITY): Payer: Self-pay | Admitting: Surgery

## 2018-07-24 NOTE — Telephone Encounter (Signed)
Received an order from Jeri Cos, [place] to schedule LE Venous Reflux exam.  I was unable to reach the patient on her cell at [phone] as her voicemail is not set up.

## 2018-07-25 DIAGNOSIS — L97812 Non-pressure chronic ulcer of other part of right lower leg with fat layer exposed: Secondary | ICD-10-CM | POA: Diagnosis not present

## 2018-07-25 DIAGNOSIS — I872 Venous insufficiency (chronic) (peripheral): Secondary | ICD-10-CM | POA: Diagnosis not present

## 2018-07-28 DIAGNOSIS — L97812 Non-pressure chronic ulcer of other part of right lower leg with fat layer exposed: Secondary | ICD-10-CM | POA: Diagnosis not present

## 2018-07-28 DIAGNOSIS — I872 Venous insufficiency (chronic) (peripheral): Secondary | ICD-10-CM | POA: Diagnosis not present

## 2018-07-29 ENCOUNTER — Other Ambulatory Visit: Payer: Self-pay | Admitting: Physician Assistant

## 2018-07-29 ENCOUNTER — Ambulatory Visit (HOSPITAL_COMMUNITY)
Admission: RE | Admit: 2018-07-29 | Discharge: 2018-07-29 | Disposition: A | Discharge status: Home / Self Care | Payer: Medicare Other | Source: Ambulatory Visit | Attending: Vascular Surgery | Admitting: Vascular Surgery

## 2018-07-29 DIAGNOSIS — I872 Venous insufficiency (chronic) (peripheral): Secondary | ICD-10-CM

## 2018-07-29 DIAGNOSIS — R936 Abnormal findings on diagnostic imaging of limbs: Secondary | ICD-10-CM | POA: Diagnosis not present

## 2018-07-29 DIAGNOSIS — L97812 Non-pressure chronic ulcer of other part of right lower leg with fat layer exposed: Secondary | ICD-10-CM

## 2018-07-29 DIAGNOSIS — L97929 Non-pressure chronic ulcer of unspecified part of left lower leg with unspecified severity: Secondary | ICD-10-CM | POA: Insufficient documentation

## 2018-07-29 DIAGNOSIS — L97919 Non-pressure chronic ulcer of unspecified part of right lower leg with unspecified severity: Secondary | ICD-10-CM | POA: Insufficient documentation

## 2018-07-29 DIAGNOSIS — R600 Localized edema: Secondary | ICD-10-CM | POA: Insufficient documentation

## 2018-07-30 ENCOUNTER — Encounter (HOSPITAL_BASED_OUTPATIENT_CLINIC_OR_DEPARTMENT_OTHER): Payer: Medicare Other | Attending: Physician Assistant

## 2018-07-30 DIAGNOSIS — L97812 Non-pressure chronic ulcer of other part of right lower leg with fat layer exposed: Secondary | ICD-10-CM | POA: Diagnosis not present

## 2018-07-30 DIAGNOSIS — L97822 Non-pressure chronic ulcer of other part of left lower leg with fat layer exposed: Secondary | ICD-10-CM | POA: Diagnosis not present

## 2018-07-30 DIAGNOSIS — F0390 Unspecified dementia without behavioral disturbance: Secondary | ICD-10-CM | POA: Insufficient documentation

## 2018-07-30 DIAGNOSIS — I10 Essential (primary) hypertension: Secondary | ICD-10-CM | POA: Diagnosis not present

## 2018-07-30 DIAGNOSIS — I872 Venous insufficiency (chronic) (peripheral): Secondary | ICD-10-CM | POA: Insufficient documentation

## 2018-08-01 DIAGNOSIS — I872 Venous insufficiency (chronic) (peripheral): Secondary | ICD-10-CM | POA: Diagnosis not present

## 2018-08-01 DIAGNOSIS — L97812 Non-pressure chronic ulcer of other part of right lower leg with fat layer exposed: Secondary | ICD-10-CM | POA: Diagnosis not present

## 2018-08-04 DIAGNOSIS — I872 Venous insufficiency (chronic) (peripheral): Secondary | ICD-10-CM | POA: Diagnosis not present

## 2018-08-04 DIAGNOSIS — L97812 Non-pressure chronic ulcer of other part of right lower leg with fat layer exposed: Secondary | ICD-10-CM | POA: Diagnosis not present

## 2018-08-06 DIAGNOSIS — L97822 Non-pressure chronic ulcer of other part of left lower leg with fat layer exposed: Secondary | ICD-10-CM | POA: Diagnosis not present

## 2018-08-06 DIAGNOSIS — I872 Venous insufficiency (chronic) (peripheral): Secondary | ICD-10-CM | POA: Diagnosis not present

## 2018-08-06 DIAGNOSIS — F0390 Unspecified dementia without behavioral disturbance: Secondary | ICD-10-CM | POA: Diagnosis not present

## 2018-08-06 DIAGNOSIS — I10 Essential (primary) hypertension: Secondary | ICD-10-CM | POA: Diagnosis not present

## 2018-08-06 DIAGNOSIS — L97812 Non-pressure chronic ulcer of other part of right lower leg with fat layer exposed: Secondary | ICD-10-CM | POA: Diagnosis not present

## 2018-08-08 DIAGNOSIS — I872 Venous insufficiency (chronic) (peripheral): Secondary | ICD-10-CM | POA: Diagnosis not present

## 2018-08-08 DIAGNOSIS — L97812 Non-pressure chronic ulcer of other part of right lower leg with fat layer exposed: Secondary | ICD-10-CM | POA: Diagnosis not present

## 2018-08-11 DIAGNOSIS — I872 Venous insufficiency (chronic) (peripheral): Secondary | ICD-10-CM | POA: Diagnosis not present

## 2018-08-11 DIAGNOSIS — L97812 Non-pressure chronic ulcer of other part of right lower leg with fat layer exposed: Secondary | ICD-10-CM | POA: Diagnosis not present

## 2018-08-13 ENCOUNTER — Ambulatory Visit: Payer: Medicare Other | Admitting: Cardiovascular Disease

## 2018-08-13 DIAGNOSIS — I10 Essential (primary) hypertension: Secondary | ICD-10-CM | POA: Diagnosis not present

## 2018-08-13 DIAGNOSIS — B965 Pseudomonas (aeruginosa) (mallei) (pseudomallei) as the cause of diseases classified elsewhere: Secondary | ICD-10-CM | POA: Diagnosis not present

## 2018-08-13 DIAGNOSIS — L97822 Non-pressure chronic ulcer of other part of left lower leg with fat layer exposed: Secondary | ICD-10-CM | POA: Diagnosis not present

## 2018-08-13 DIAGNOSIS — I872 Venous insufficiency (chronic) (peripheral): Secondary | ICD-10-CM | POA: Diagnosis not present

## 2018-08-13 DIAGNOSIS — L97812 Non-pressure chronic ulcer of other part of right lower leg with fat layer exposed: Secondary | ICD-10-CM | POA: Diagnosis not present

## 2018-08-13 DIAGNOSIS — F0390 Unspecified dementia without behavioral disturbance: Secondary | ICD-10-CM | POA: Diagnosis not present

## 2018-08-15 DIAGNOSIS — I872 Venous insufficiency (chronic) (peripheral): Secondary | ICD-10-CM | POA: Diagnosis not present

## 2018-08-15 DIAGNOSIS — L97812 Non-pressure chronic ulcer of other part of right lower leg with fat layer exposed: Secondary | ICD-10-CM | POA: Diagnosis not present

## 2018-08-18 DIAGNOSIS — L97812 Non-pressure chronic ulcer of other part of right lower leg with fat layer exposed: Secondary | ICD-10-CM | POA: Diagnosis not present

## 2018-08-18 DIAGNOSIS — I872 Venous insufficiency (chronic) (peripheral): Secondary | ICD-10-CM | POA: Diagnosis not present

## 2018-08-19 ENCOUNTER — Ambulatory Visit: Payer: Medicare Other | Admitting: Internal Medicine

## 2018-08-20 ENCOUNTER — Ambulatory Visit: Payer: Medicare Other | Admitting: Neurology

## 2018-08-20 DIAGNOSIS — I872 Venous insufficiency (chronic) (peripheral): Secondary | ICD-10-CM | POA: Diagnosis not present

## 2018-08-20 DIAGNOSIS — L97822 Non-pressure chronic ulcer of other part of left lower leg with fat layer exposed: Secondary | ICD-10-CM | POA: Diagnosis not present

## 2018-08-20 DIAGNOSIS — I10 Essential (primary) hypertension: Secondary | ICD-10-CM | POA: Diagnosis not present

## 2018-08-20 DIAGNOSIS — F0390 Unspecified dementia without behavioral disturbance: Secondary | ICD-10-CM | POA: Diagnosis not present

## 2018-08-20 DIAGNOSIS — L97812 Non-pressure chronic ulcer of other part of right lower leg with fat layer exposed: Secondary | ICD-10-CM | POA: Diagnosis not present

## 2018-08-21 ENCOUNTER — Ambulatory Visit (INDEPENDENT_AMBULATORY_CARE_PROVIDER_SITE_OTHER): Payer: Medicare Other | Admitting: Internal Medicine

## 2018-08-21 ENCOUNTER — Encounter: Payer: Self-pay | Admitting: Internal Medicine

## 2018-08-21 VITALS — BP 120/80 | HR 52 | Temp 98.1°F | Wt 145.0 lb

## 2018-08-21 DIAGNOSIS — N816 Rectocele: Secondary | ICD-10-CM

## 2018-08-21 DIAGNOSIS — Z23 Encounter for immunization: Secondary | ICD-10-CM | POA: Diagnosis not present

## 2018-08-21 NOTE — Patient Instructions (Signed)
Flu vaccine given.  Patient has rectocele.  She was reassured.  No further treatment needed for this issue.

## 2018-08-21 NOTE — Progress Notes (Signed)
   Subjective:    Patient ID: Donna Woods, female    DOB: 03-07-1934, 84 y.o.   MRN: [id number]  HPI Pt originally scheduled for Tuesday but did not come that day and came with husband today. She and her husband both are having some memory issues. We worked her in today as they live out of town. Pt has noticed some protrusion in vaginal area that is new. No pain just  bulge in vaginal area she says. She has had hysterectomy. No complaint of pain or discomfort in vaginal area.  No UTI symptoms.   Review of Systems see above.      Objective:   Physical Exam Pelvic exam reveals NFEG.  Labia intact without erythema.  No vaginal discharge.  Patient has a rectocele on vaginal inspection.  No cystocele noted.       Assessment & Plan:  Rectocele  Plan: There is no need for her to have surgical correction of this.  Is not interfering with daily activities and is not causing pain.  She was reassured.  Flu vaccine given.

## 2018-08-22 DIAGNOSIS — L97812 Non-pressure chronic ulcer of other part of right lower leg with fat layer exposed: Secondary | ICD-10-CM | POA: Diagnosis not present

## 2018-08-22 DIAGNOSIS — I872 Venous insufficiency (chronic) (peripheral): Secondary | ICD-10-CM | POA: Diagnosis not present

## 2018-08-25 DIAGNOSIS — L97812 Non-pressure chronic ulcer of other part of right lower leg with fat layer exposed: Secondary | ICD-10-CM | POA: Diagnosis not present

## 2018-08-25 DIAGNOSIS — I872 Venous insufficiency (chronic) (peripheral): Secondary | ICD-10-CM | POA: Diagnosis not present

## 2018-08-27 ENCOUNTER — Encounter (HOSPITAL_BASED_OUTPATIENT_CLINIC_OR_DEPARTMENT_OTHER): Payer: Medicare Other | Attending: Internal Medicine

## 2018-08-27 DIAGNOSIS — F0390 Unspecified dementia without behavioral disturbance: Secondary | ICD-10-CM | POA: Diagnosis not present

## 2018-08-27 DIAGNOSIS — M810 Age-related osteoporosis without current pathological fracture: Secondary | ICD-10-CM | POA: Diagnosis not present

## 2018-08-27 DIAGNOSIS — I10 Essential (primary) hypertension: Secondary | ICD-10-CM | POA: Insufficient documentation

## 2018-08-27 DIAGNOSIS — L97812 Non-pressure chronic ulcer of other part of right lower leg with fat layer exposed: Secondary | ICD-10-CM | POA: Insufficient documentation

## 2018-08-27 DIAGNOSIS — L97822 Non-pressure chronic ulcer of other part of left lower leg with fat layer exposed: Secondary | ICD-10-CM | POA: Diagnosis not present

## 2018-08-27 DIAGNOSIS — L97312 Non-pressure chronic ulcer of right ankle with fat layer exposed: Secondary | ICD-10-CM | POA: Diagnosis not present

## 2018-08-27 DIAGNOSIS — I872 Venous insufficiency (chronic) (peripheral): Secondary | ICD-10-CM | POA: Insufficient documentation

## 2018-08-27 DIAGNOSIS — L97322 Non-pressure chronic ulcer of left ankle with fat layer exposed: Secondary | ICD-10-CM | POA: Diagnosis not present

## 2018-08-28 ENCOUNTER — Other Ambulatory Visit: Payer: Self-pay

## 2018-08-28 ENCOUNTER — Ambulatory Visit (INDEPENDENT_AMBULATORY_CARE_PROVIDER_SITE_OTHER): Payer: Medicare Other | Admitting: Vascular Surgery

## 2018-08-28 ENCOUNTER — Encounter: Payer: Self-pay | Admitting: Vascular Surgery

## 2018-08-28 VITALS — BP 113/62 | HR 43 | Temp 96.7°F | Resp 14 | Ht 66.0 in | Wt 141.0 lb

## 2018-08-28 DIAGNOSIS — I83019 Varicose veins of right lower extremity with ulcer of unspecified site: Secondary | ICD-10-CM | POA: Diagnosis not present

## 2018-08-28 DIAGNOSIS — L97929 Non-pressure chronic ulcer of unspecified part of left lower leg with unspecified severity: Secondary | ICD-10-CM | POA: Diagnosis not present

## 2018-08-28 DIAGNOSIS — L97919 Non-pressure chronic ulcer of unspecified part of right lower leg with unspecified severity: Secondary | ICD-10-CM | POA: Diagnosis not present

## 2018-08-28 DIAGNOSIS — I83029 Varicose veins of left lower extremity with ulcer of unspecified site: Secondary | ICD-10-CM | POA: Diagnosis not present

## 2018-08-28 NOTE — Progress Notes (Signed)
Referring Physician:Hoyt Stone PA-C  Patient name: Donna Woods MRN: [id number] DOB: [birth year] Sex: female  REASON FOR CONSULT: Nonhealing wounds bilateral legs  HPI: Donna Woods is a 84 y.o. female, a several month history of ulcerations on the medial malleolus of both legs which have been slow to heal.  He is currently being followed at the wound center.  They are doing compression dressings.  She was previously evaluated by Dr. Alvester Chou and thought not to have an arterial component to her condition.  She was sent here for further evaluation for venous disease.  She denies prior episodes of ulcerations similar to these.  She states that the ulcers are painful.  She denies prior history of DVT.  She occasionally does have some leg swelling.  Other medical problems include some memory deficits, hypertension, prediabetes, bradycardia of which are been stable.  Past Medical History:  Diagnosis Date  . Bradycardia   . DJD (degenerative joint disease)   . Fe deficiency anemia   . GERD (gastroesophageal reflux disease)   . HTN (hypertension)   . Hypokalemia   . Memory changes   . 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 Prediabetes   . PVC's (premature ventricular contractions)   . Vitamin B deficiency    Past Surgical History:  Procedure Laterality Date  . ABDOMINAL HYSTERECTOMY    . BREAST SURGERY     BENIGN LUMP REMOVED FROM ?LEFT bREAST  . CHOLECYSTECTOMY    . JOINT REPLACEMENT     RTA  . TONSILLECTOMY    . TOTAL ABDOMINAL HYSTERECTOMY W/ BILATERAL SALPINGOOPHORECTOMY     OVARY REMOVED A YR OR TWO BEFORE HYSTERECTOMY  . TOTAL KNEE ARTHROPLASTY Left 12/06/2015   Procedure: TOTAL KNEE ARTHROPLASTY;  Surgeon: Garald Balding, MD;  Location: [facility];  Service: Orthopedics;  Laterality: Left;    Family History  Problem Relation Age of Onset  . Pneumonia Mother   . Pulmonary disease Father     SOCIAL HISTORY: Social History   Socioeconomic History  . Marital status: Married      Spouse name: Not on file  . Number of children: Not on file  . Years of education: Not on file  . Highest education level: Not on file  Occupational History  . Not on file  Social Needs  . Financial resource strain: Not on file  . Food insecurity:    Worry: Not on file    Inability: Not on file  . Transportation needs:    Medical: Not on file    Non-medical: Not on file  Tobacco Use  . Smoking status: Never Smoker  . Smokeless tobacco: Never Used  Substance and Sexual Activity  . Alcohol use: No  . Drug use: No  . Sexual activity: Never  Lifestyle  . Physical activity:    Days per week: Not on file    Minutes per session: Not on file  . Stress: Not on file  Relationships  . Social connections:    Talks on phone: Not on file    Gets together: Not on file    Attends religious service: Not on file    Active member of club or organization: Not on file    Attends meetings of clubs or organizations: Not on file    Relationship status: Not on file  . Intimate partner violence:    Fear of current or ex partner: Not on file    Emotionally abused: Not on file  Physically abused: Not on file    Forced sexual activity: Not on file  Other Topics Concern  . Not on file  Social History Narrative  . Not on file    Allergies  Allergen Reactions  . Benicar [Olmesartan]   . Penicillins Rash    Has patient had a PCN reaction causing immediate rash, facial/tongue/throat swelling, SOB or lightheadedness with hypotension: Yes Has patient had a PCN reaction causing severe rash involving mucus membranes or skin necrosis: No Has patient had a PCN reaction that required hospitalization No Has patient had a PCN reaction occurring within the last 10 years: No If all of the above answers are "NO", then may proceed with Cephalosporin use.   . Tape Other (See Comments)    Causes blisters    Current Outpatient Medications  Medication Sig Dispense Refill  . albuterol (PROVENTIL  HFA;VENTOLIN HFA) 108 (90 BASE) MCG/ACT inhaler Inhale 2 puffs into the lungs every 6 (six) hours as needed.    Marland Kitchen atenolol (TENORMIN) 25 MG tablet Take 0.5 tablets (12.5 mg total) by mouth daily. 45 tablet 3  . Calcium-Magnesium-Vitamin D (CALCIUM MAGNESIUM PO) Take by mouth daily.    . collagenase (SANTYL) ointment Apply 1 application topically daily.    Marland Kitchen donepezil (ARICEPT) 5 MG tablet Take 1 tablet (5 mg total) by mouth at bedtime. 30 tablet 5  . fluocinonide cream (LIDEX) 0.93 % Apply 1 application topically 2 (two) times daily. 30 g 0  . gabapentin (NEURONTIN) 300 MG capsule One or 2 capsules at night for pain 60 capsule 3  . lidocaine (LIDODERM) 5 % APPLY 1 TO 3 PATCHES FOR 12 HOURS OUT OF 24 HOURS AS DIRECTED 90 patch 3  . Multiple Vitamin (MULTIVITAMIN) capsule Take 1 capsule by mouth daily.    . mupirocin ointment (BACTROBAN) 2 % Place 1 application into the nose 2 (two) times daily. PLEASE DISCONTINUE BACTROBAN CREAM AND DISPENSE OINTMENT. 22 g 0  . nitrofurantoin, macrocrystal-monohydrate, (MACROBID) 100 MG capsule Take 1 capsule (100 mg total) by mouth 2 (two) times daily. 20 capsule 0  . omeprazole (PRILOSEC) 20 MG capsule TAKE ONE CAPSULE BY MOUTH ONCE DAILY 90 capsule 3  . potassium chloride (MICRO-K) 10 MEQ CR capsule Take 10 mEq by mouth 2 (two) times daily.     Marland Kitchen triamterene-hydrochlorothiazide (MAXZIDE-25) 37.5-25 MG tablet TAKE ONE-HALF TO ONE TABLET BY MOUTH ONCE DAILY 90 tablet 1  . doxycycline (VIBRA-TABS) 100 MG tablet Take 1 tablet (100 mg total) by mouth 2 (two) times daily. (Patient not taking: Reported on 08/28/2018) 20 tablet 0   No current facility-administered medications for this visit.     ROS:   General:  No weight loss, Fever, chills  HEENT: No recent headaches, no nasal bleeding, no visual changes, no sore throat  Neurologic: No dizziness, blackouts, seizures. No recent symptoms of stroke or mini- stroke. No recent episodes of slurred speech, or temporary  blindness.  Cardiac: No recent episodes of chest pain/pressure, no shortness of breath at rest.  No shortness of breath with exertion.  Denies history of atrial fibrillation or irregular heartbeat  Vascular: No history of rest pain in feet.  No history of claudication.  No history of non-healing ulcer, No history of DVT   Pulmonary: No home oxygen, no productive cough, no hemoptysis,  No asthma or wheezing  Musculoskeletal:  [ ]  Arthritis, [ ]  Low back pain,  [ ]  Joint pain  Hematologic:No history of hypercoagulable state.  No history of easy  bleeding.  No history of anemia  Gastrointestinal: No hematochezia or melena,  No gastroesophageal reflux, no trouble swallowing  Urinary: [ ]  chronic Kidney disease, [ ]  on HD - [ ]  MWF or [ ]  TTHS, [ ]  Burning with urination, [ ]  Frequent urination, [ ]  Difficulty urinating;   Skin: No rashes  Psychological: No history of anxiety,  No history of depression   Physical Examination  Vitals:   08/28/18 1533  BP: 113/62  Pulse: (!) 43  Resp: 14  Temp: (!) 96.7 F (35.9 C)  TempSrc: Oral  SpO2: 99%  Weight: 141 lb (64 kg)  Height: 5\' 6"  (1.676 m)    Body mass index is 22.76 kg/m.  General:  Alert and oriented, no acute distress HEENT: Normal Neck: No bruit or JVD Pulmonary: Clear to auscultation bilaterally Cardiac: Regular Rate and Rhythm without murmur Abdomen: Soft, non-tender, non-distended Skin: No rash bilateral medial malleolus ulcers as depicted below Extremity Pulses:  2+ radial, brachial, femoral, dorsalis pedis, posterior tibial pulses bilaterally Musculoskeletal: No deformity or edema  Neurologic: Upper and lower extremity motor 5/5 and symmetric        DATA:  She had a venous reflux exam performed on July 30, 2018.  This showed elements of reflux in the left greater saphenous vein but only in the mid thigh and distal thigh there was also some reflux in the right common femoral vein but not in the right  greater saphenous vein.  Left greater saphenous vein was 4 mm in diameter  ASSESSMENT: Ulcers bilateral medial malleolus which certainly have characteristics of venous stasis ulcer.  However she had minimal reflux on her venous duplex exam.  Left greater saphenous vein was only 4 mm in diameter.  Right greater saphenous vein was similar size.  She has normal palpable pulses and I agree with Dr. Alvester Chou that she does not have an arterial component.  PLAN: She will continue follow-up at the wound center with compression dressings.  She will follow-up with me in 4 to 6 weeks and we will repeat her duplex ultrasound.  If she does have significant reflux at that point that we can document we could consider laser ablation of the left or right greater saphenous vein.  However without documented reflux there is minimal benefit to any treatment of her superficial venous system at this point.  Follow-up duplex ultrasound bilateral lower extremities 4 to 6 weeks  Ruta Hinds, MD [facility] Office: [phone] Pager: [phone]

## 2018-08-29 DIAGNOSIS — L97812 Non-pressure chronic ulcer of other part of right lower leg with fat layer exposed: Secondary | ICD-10-CM | POA: Diagnosis not present

## 2018-08-29 DIAGNOSIS — I872 Venous insufficiency (chronic) (peripheral): Secondary | ICD-10-CM | POA: Diagnosis not present

## 2018-09-01 DIAGNOSIS — I872 Venous insufficiency (chronic) (peripheral): Secondary | ICD-10-CM | POA: Diagnosis not present

## 2018-09-01 DIAGNOSIS — L97812 Non-pressure chronic ulcer of other part of right lower leg with fat layer exposed: Secondary | ICD-10-CM | POA: Diagnosis not present

## 2018-09-02 ENCOUNTER — Other Ambulatory Visit: Payer: Self-pay

## 2018-09-02 DIAGNOSIS — I83029 Varicose veins of left lower extremity with ulcer of unspecified site: Principal | ICD-10-CM

## 2018-09-02 DIAGNOSIS — L97919 Non-pressure chronic ulcer of unspecified part of right lower leg with unspecified severity: Principal | ICD-10-CM

## 2018-09-02 DIAGNOSIS — I83019 Varicose veins of right lower extremity with ulcer of unspecified site: Secondary | ICD-10-CM

## 2018-09-02 DIAGNOSIS — L97929 Non-pressure chronic ulcer of unspecified part of left lower leg with unspecified severity: Principal | ICD-10-CM

## 2018-09-03 DIAGNOSIS — L97812 Non-pressure chronic ulcer of other part of right lower leg with fat layer exposed: Secondary | ICD-10-CM | POA: Diagnosis not present

## 2018-09-03 DIAGNOSIS — I872 Venous insufficiency (chronic) (peripheral): Secondary | ICD-10-CM | POA: Diagnosis not present

## 2018-09-05 ENCOUNTER — Ambulatory Visit: Payer: Medicare Other | Admitting: Cardiovascular Disease

## 2018-09-05 DIAGNOSIS — I872 Venous insufficiency (chronic) (peripheral): Secondary | ICD-10-CM | POA: Diagnosis not present

## 2018-09-05 DIAGNOSIS — L97812 Non-pressure chronic ulcer of other part of right lower leg with fat layer exposed: Secondary | ICD-10-CM | POA: Diagnosis not present

## 2018-09-08 DIAGNOSIS — L97812 Non-pressure chronic ulcer of other part of right lower leg with fat layer exposed: Secondary | ICD-10-CM | POA: Diagnosis not present

## 2018-09-08 DIAGNOSIS — I872 Venous insufficiency (chronic) (peripheral): Secondary | ICD-10-CM | POA: Diagnosis not present

## 2018-09-10 DIAGNOSIS — I872 Venous insufficiency (chronic) (peripheral): Secondary | ICD-10-CM | POA: Diagnosis not present

## 2018-09-10 DIAGNOSIS — L97822 Non-pressure chronic ulcer of other part of left lower leg with fat layer exposed: Secondary | ICD-10-CM | POA: Diagnosis not present

## 2018-09-10 DIAGNOSIS — L97322 Non-pressure chronic ulcer of left ankle with fat layer exposed: Secondary | ICD-10-CM | POA: Diagnosis not present

## 2018-09-10 DIAGNOSIS — I10 Essential (primary) hypertension: Secondary | ICD-10-CM | POA: Diagnosis not present

## 2018-09-10 DIAGNOSIS — F0390 Unspecified dementia without behavioral disturbance: Secondary | ICD-10-CM | POA: Diagnosis not present

## 2018-09-10 DIAGNOSIS — L97812 Non-pressure chronic ulcer of other part of right lower leg with fat layer exposed: Secondary | ICD-10-CM | POA: Diagnosis not present

## 2018-09-10 DIAGNOSIS — M810 Age-related osteoporosis without current pathological fracture: Secondary | ICD-10-CM | POA: Diagnosis not present

## 2018-09-10 DIAGNOSIS — L97312 Non-pressure chronic ulcer of right ankle with fat layer exposed: Secondary | ICD-10-CM | POA: Diagnosis not present

## 2018-09-12 ENCOUNTER — Telehealth: Payer: Self-pay

## 2018-09-12 ENCOUNTER — Other Ambulatory Visit: Payer: Self-pay | Admitting: Internal Medicine

## 2018-09-12 DIAGNOSIS — I872 Venous insufficiency (chronic) (peripheral): Secondary | ICD-10-CM | POA: Diagnosis not present

## 2018-09-12 DIAGNOSIS — L97812 Non-pressure chronic ulcer of other part of right lower leg with fat layer exposed: Secondary | ICD-10-CM | POA: Diagnosis not present

## 2018-09-12 MED ORDER — OLANZAPINE 5 MG PO TABS: 5.0000 mg | ORAL_TABLET | Freq: Every day | ORAL | 1 refills | Status: DC

## 2018-09-12 NOTE — Telephone Encounter (Signed)
I need more information. What ulcers are we talking about?

## 2018-09-12 NOTE — Telephone Encounter (Signed)
Patient's husband is calling to request pain medication for patient. She is complaining of ulcers being painful.

## 2018-09-12 NOTE — Telephone Encounter (Signed)
[facility] is dressing leg wounds. Pt complaining of burning perhaps related to substances being used in dressings. She has dementia. Reluctant to call in pain medication. Try Zyprexa 5 mg hs

## 2018-09-15 DIAGNOSIS — L97812 Non-pressure chronic ulcer of other part of right lower leg with fat layer exposed: Secondary | ICD-10-CM | POA: Diagnosis not present

## 2018-09-15 DIAGNOSIS — I872 Venous insufficiency (chronic) (peripheral): Secondary | ICD-10-CM | POA: Diagnosis not present

## 2018-09-16 ENCOUNTER — Telehealth: Payer: Self-pay | Admitting: Internal Medicine

## 2018-09-16 ENCOUNTER — Encounter: Payer: Self-pay | Admitting: Internal Medicine

## 2018-09-16 NOTE — Telephone Encounter (Signed)
Pt has memory loss and agitation. Daughter says she is up at night a lot. This is hard on pt's husband. She has leg ulcers that are being seen by [facility] nursing in addition to [facility]. Husband called and requested something for her as she said her legs were burning. Reluctant to prescribe pain meds with memory loss. I prescribed Zyprexa but husband called and said that was not what he requested. I do think she should try it at bedtime. Lucretia Roers spoke to nurse Caro Laroche about this to see if she could get husband to comply with this.Estill Bamberg thinks he may be giving pt too much Tylenol. We can certainly try Tramadol during dressing changes but not comfortable with her being on this around the clock due to sedation, confusion and fall risk.

## 2018-09-17 DIAGNOSIS — F0390 Unspecified dementia without behavioral disturbance: Secondary | ICD-10-CM | POA: Diagnosis not present

## 2018-09-17 DIAGNOSIS — L97812 Non-pressure chronic ulcer of other part of right lower leg with fat layer exposed: Secondary | ICD-10-CM | POA: Diagnosis not present

## 2018-09-17 DIAGNOSIS — L97819 Non-pressure chronic ulcer of other part of right lower leg with unspecified severity: Secondary | ICD-10-CM | POA: Diagnosis not present

## 2018-09-17 DIAGNOSIS — I872 Venous insufficiency (chronic) (peripheral): Secondary | ICD-10-CM | POA: Diagnosis not present

## 2018-09-17 DIAGNOSIS — M810 Age-related osteoporosis without current pathological fracture: Secondary | ICD-10-CM | POA: Diagnosis not present

## 2018-09-17 DIAGNOSIS — L97822 Non-pressure chronic ulcer of other part of left lower leg with fat layer exposed: Secondary | ICD-10-CM | POA: Diagnosis not present

## 2018-09-17 DIAGNOSIS — I10 Essential (primary) hypertension: Secondary | ICD-10-CM | POA: Diagnosis not present

## 2018-09-19 DIAGNOSIS — L97812 Non-pressure chronic ulcer of other part of right lower leg with fat layer exposed: Secondary | ICD-10-CM | POA: Diagnosis not present

## 2018-09-19 DIAGNOSIS — I872 Venous insufficiency (chronic) (peripheral): Secondary | ICD-10-CM | POA: Diagnosis not present

## 2018-09-22 DIAGNOSIS — L97812 Non-pressure chronic ulcer of other part of right lower leg with fat layer exposed: Secondary | ICD-10-CM | POA: Diagnosis not present

## 2018-09-22 DIAGNOSIS — I872 Venous insufficiency (chronic) (peripheral): Secondary | ICD-10-CM | POA: Diagnosis not present

## 2018-09-23 DIAGNOSIS — I872 Venous insufficiency (chronic) (peripheral): Secondary | ICD-10-CM | POA: Diagnosis not present

## 2018-09-23 DIAGNOSIS — I739 Peripheral vascular disease, unspecified: Secondary | ICD-10-CM | POA: Diagnosis not present

## 2018-09-23 DIAGNOSIS — F0390 Unspecified dementia without behavioral disturbance: Secondary | ICD-10-CM | POA: Diagnosis not present

## 2018-09-23 DIAGNOSIS — K219 Gastro-esophageal reflux disease without esophagitis: Secondary | ICD-10-CM | POA: Diagnosis not present

## 2018-09-23 DIAGNOSIS — I10 Essential (primary) hypertension: Secondary | ICD-10-CM | POA: Diagnosis not present

## 2018-09-23 DIAGNOSIS — B965 Pseudomonas (aeruginosa) (mallei) (pseudomallei) as the cause of diseases classified elsewhere: Secondary | ICD-10-CM | POA: Diagnosis not present

## 2018-09-23 DIAGNOSIS — Z9181 History of falling: Secondary | ICD-10-CM | POA: Diagnosis not present

## 2018-09-23 DIAGNOSIS — I4891 Unspecified atrial fibrillation: Secondary | ICD-10-CM | POA: Diagnosis not present

## 2018-09-23 DIAGNOSIS — M810 Age-related osteoporosis without current pathological fracture: Secondary | ICD-10-CM | POA: Diagnosis not present

## 2018-09-23 DIAGNOSIS — Z96652 Presence of left artificial knee joint: Secondary | ICD-10-CM | POA: Diagnosis not present

## 2018-09-23 DIAGNOSIS — L97822 Non-pressure chronic ulcer of other part of left lower leg with fat layer exposed: Secondary | ICD-10-CM | POA: Diagnosis not present

## 2018-09-23 DIAGNOSIS — L97812 Non-pressure chronic ulcer of other part of right lower leg with fat layer exposed: Secondary | ICD-10-CM | POA: Diagnosis not present

## 2018-09-24 DIAGNOSIS — M810 Age-related osteoporosis without current pathological fracture: Secondary | ICD-10-CM | POA: Diagnosis not present

## 2018-09-24 DIAGNOSIS — I10 Essential (primary) hypertension: Secondary | ICD-10-CM | POA: Diagnosis not present

## 2018-09-24 DIAGNOSIS — L97822 Non-pressure chronic ulcer of other part of left lower leg with fat layer exposed: Secondary | ICD-10-CM | POA: Diagnosis not present

## 2018-09-24 DIAGNOSIS — L97812 Non-pressure chronic ulcer of other part of right lower leg with fat layer exposed: Secondary | ICD-10-CM | POA: Diagnosis not present

## 2018-09-24 DIAGNOSIS — I872 Venous insufficiency (chronic) (peripheral): Secondary | ICD-10-CM | POA: Diagnosis not present

## 2018-09-24 DIAGNOSIS — F0390 Unspecified dementia without behavioral disturbance: Secondary | ICD-10-CM | POA: Diagnosis not present

## 2018-09-26 ENCOUNTER — Ambulatory Visit (INDEPENDENT_AMBULATORY_CARE_PROVIDER_SITE_OTHER): Payer: Medicare Other | Admitting: Cardiovascular Disease

## 2018-09-26 ENCOUNTER — Encounter: Payer: Self-pay | Admitting: Cardiovascular Disease

## 2018-09-26 DIAGNOSIS — R001 Bradycardia, unspecified: Secondary | ICD-10-CM | POA: Diagnosis not present

## 2018-09-26 DIAGNOSIS — I10 Essential (primary) hypertension: Secondary | ICD-10-CM | POA: Diagnosis not present

## 2018-09-26 DIAGNOSIS — L97822 Non-pressure chronic ulcer of other part of left lower leg with fat layer exposed: Secondary | ICD-10-CM | POA: Diagnosis not present

## 2018-09-26 DIAGNOSIS — L97929 Non-pressure chronic ulcer of unspecified part of left lower leg with unspecified severity: Secondary | ICD-10-CM

## 2018-09-26 DIAGNOSIS — I83019 Varicose veins of right lower extremity with ulcer of unspecified site: Secondary | ICD-10-CM | POA: Insufficient documentation

## 2018-09-26 DIAGNOSIS — L97919 Non-pressure chronic ulcer of unspecified part of right lower leg with unspecified severity: Secondary | ICD-10-CM

## 2018-09-26 DIAGNOSIS — I83029 Varicose veins of left lower extremity with ulcer of unspecified site: Secondary | ICD-10-CM | POA: Diagnosis not present

## 2018-09-26 DIAGNOSIS — L97812 Non-pressure chronic ulcer of other part of right lower leg with fat layer exposed: Secondary | ICD-10-CM | POA: Diagnosis not present

## 2018-09-26 DIAGNOSIS — I739 Peripheral vascular disease, unspecified: Secondary | ICD-10-CM | POA: Diagnosis not present

## 2018-09-26 DIAGNOSIS — B965 Pseudomonas (aeruginosa) (mallei) (pseudomallei) as the cause of diseases classified elsewhere: Secondary | ICD-10-CM | POA: Diagnosis not present

## 2018-09-26 DIAGNOSIS — I872 Venous insufficiency (chronic) (peripheral): Secondary | ICD-10-CM | POA: Diagnosis not present

## 2018-09-26 NOTE — Patient Instructions (Signed)
Medication Instructions:  Your physician recommends that you continue on your current medications as directed. Please refer to the Current Medication list given to you today. If you need a refill on your cardiac medications before your next appointment, please call your pharmacy.   Lab work: none If you have labs (blood work) drawn today and your tests are completely normal, you will receive your results only by: Marland Kitchen MyChart Message (if you have MyChart) OR . A paper copy in the mail If you have any lab test that is abnormal or we need to change your treatment, we will call you to review the results.  Testing/Procedures: none  Follow-Up: At [facility], you and your health needs are our priority.  As part of our continuing mission to provide you with exceptional heart care, we have created designated Provider Care Teams.  These Care Teams include your primary Cardiologist (physician) and Advanced Practice Providers (APPs -  Physician Assistants and Nurse Practitioners) who all work together to provide you with the care you need, when you need it. You will need a follow up appointment in 6 months.  Please call our office 2 months in advance to schedule this appointment.  You may see Quay Burow, MD or one of the following Advanced Practice Providers on your designated Care Team:   Kerin Ransom, PA-C Roby Lofts, [place] . Sande Rives, PA-C  Any Other Special Instructions Will Be Listed Below (If Applicable).

## 2018-09-26 NOTE — Assessment & Plan Note (Signed)
History of essential hypertension blood pressure measured today at 147/71.  She is on atenolol and Maxide.  Continue current meds at current dosing.

## 2018-09-26 NOTE — Progress Notes (Signed)
09/26/2018 [name]   May 26, 1934  [id number]  Primary Physician Baxley, Cresenciano Lick, MD Primary Cardiologist: Lorretta Harp MD Garret Reddish, [place], [place]  HPI:  Donna Woods is a 84 y.o.  thin appearing, married, Caucasian female mother of 2, grandmother to 6 grandchildren who was formerly a patient of Dr. Janene Madeira remotely. I last saw her in the office 07/15/2018. She does have progressive dementia as well.. She has a history of palpitations related to asymptomatic PVCs, hypertension. She denies chest pain or shortness of breath. Her last stress test performed 5 years ago was nonischemic. She did have a poorly defined neurologic illness which affected her memory and spanned many months requiring prolonged hospitalization and antibiotic therapy which she has for the most part recovered from.saw her a year ago she is asymptomatic. She denies chest pain shortness of breath dizziness or presyncope.she was referred back for preoperative clearance before elective left total knee replacement by Dr. Durward Fortes.She had a Myoview stress test performed 10/19/15 which was low risk patient underwent total knee replacement without complication. Since I saw her a year ago she has remained asymptomatic.She is chronically bradycardic with heart rates in the 40s on low-dose beta blocker is asymptomatic from this.  Since I saw her 2 months ago I have sent her to the wound care center for bilateral lower extremity venous stasis ulcers.  She is also seen Dr. Oneida Alar for evaluation of this as well and we both agree that there is no arterial component.  Otherwise, she is asymptomatic and denies chest pain or shortness of breath.    Current Meds  Medication Sig  . atenolol (TENORMIN) 25 MG tablet Take 0.5 tablets (12.5 mg total) by mouth daily.  . Calcium-Magnesium-Vitamin D (CALCIUM MAGNESIUM PO) Take by mouth daily.  Marland Kitchen lidocaine (LIDODERM) 5 % APPLY 1 TO 3 PATCHES FOR 12 HOURS OUT OF 24 HOURS AS  DIRECTED  . Multiple Vitamin (MULTIVITAMIN) capsule Take 1 capsule by mouth daily.  . mupirocin ointment (BACTROBAN) 2 % Place 1 application into the nose 2 (two) times daily. PLEASE DISCONTINUE BACTROBAN CREAM AND DISPENSE OINTMENT.  Marland Kitchen omeprazole (PRILOSEC) 20 MG capsule TAKE ONE CAPSULE BY MOUTH ONCE DAILY  . potassium chloride (MICRO-K) 10 MEQ CR capsule Take 10 mEq by mouth 2 (two) times daily.   Marland Kitchen triamterene-hydrochlorothiazide (MAXZIDE-25) 37.5-25 MG tablet TAKE ONE-HALF TO ONE TABLET BY MOUTH ONCE DAILY     Allergies  Allergen Reactions  . Benicar [Olmesartan]   . Penicillins Rash    Has patient had a PCN reaction causing immediate rash, facial/tongue/throat swelling, SOB or lightheadedness with hypotension: Yes Has patient had a PCN reaction causing severe rash involving mucus membranes or skin necrosis: No Has patient had a PCN reaction that required hospitalization No Has patient had a PCN reaction occurring within the last 10 years: No If all of the above answers are "NO", then may proceed with Cephalosporin use.   . Tape Other (See Comments)    Causes blisters    Social History   Socioeconomic History  . Marital status: Married    Spouse name: Not on file  . Number of children: Not on file  . Years of education: Not on file  . Highest education level: Not on file  Occupational History  . Not on file  Social Needs  . Financial resource strain: Not on file  . Food insecurity:    Worry: Not on file    Inability: Not on  file  . Transportation needs:    Medical: Not on file    Non-medical: Not on file  Tobacco Use  . Smoking status: Never Smoker  . Smokeless tobacco: Never Used  Substance and Sexual Activity  . Alcohol use: No  . Drug use: No  . Sexual activity: Never  Lifestyle  . Physical activity:    Days per week: Not on file    Minutes per session: Not on file  . Stress: Not on file  Relationships  . Social connections:    Talks on phone: Not on file     Gets together: Not on file    Attends religious service: Not on file    Active member of club or organization: Not on file    Attends meetings of clubs or organizations: Not on file    Relationship status: Not on file  . Intimate partner violence:    Fear of current or ex partner: Not on file    Emotionally abused: Not on file    Physically abused: Not on file    Forced sexual activity: Not on file  Other Topics Concern  . Not on file  Social History Narrative  . Not on file     Review of Systems: General: negative for chills, fever, night sweats or weight changes.  Cardiovascular: negative for chest pain, dyspnea on exertion, edema, orthopnea, palpitations, paroxysmal nocturnal dyspnea or shortness of breath Dermatological: negative for rash Respiratory: negative for cough or wheezing Urologic: negative for hematuria Abdominal: negative for nausea, vomiting, diarrhea, bright red blood per rectum, melena, or hematemesis Neurologic: negative for visual changes, syncope, or dizziness All other systems reviewed and are otherwise negative except as noted above.    Blood pressure (!) 147/71, pulse (!) 52, height 5' 6.5" (1.689 m), weight 143 lb (64.9 kg), SpO2 99 %.  General appearance: alert and no distress Neck: no adenopathy, no carotid bruit, no JVD, supple, symmetrical, trachea midline and thyroid not enlarged, symmetric, no tenderness/mass/nodules Lungs: clear to auscultation bilaterally Heart: regular rate and rhythm, S1, S2 normal, no murmur, click, rub or gallop Extremities: extremities normal, atraumatic, no cyanosis or edema and Both legs are wrapped with pressure dressings. Pulses: 2+ and symmetric Skin: Her lower extremity venous stasis ulcers however these are wrapped and unavailable to be seen Neurologic: Alert and oriented X 3, normal strength and tone. Normal symmetric reflexes. Normal coordination and gait  EKG not performed today  ASSESSMENT AND PLAN:    Hypertension History of essential hypertension blood pressure measured today at 147/71.  She is on atenolol and Maxide.  Continue current meds at current dosing.  Bradycardia Chronic  Venous stasis ulcers of both lower extremities ([place]) Ms. Brazil has bilateral lower extremity venous stasis ulcers which are being treated at the wound care center.      Lorretta Harp MD FACP,FACC,FAHA, [facility] 09/26/2018 4:05 PM

## 2018-09-26 NOTE — Assessment & Plan Note (Signed)
Chronic. 

## 2018-09-26 NOTE — Assessment & Plan Note (Signed)
Ms. Scarboro has bilateral lower extremity venous stasis ulcers which are being treated at the wound care center.

## 2018-09-29 DIAGNOSIS — B965 Pseudomonas (aeruginosa) (mallei) (pseudomallei) as the cause of diseases classified elsewhere: Secondary | ICD-10-CM | POA: Diagnosis not present

## 2018-09-29 DIAGNOSIS — L97822 Non-pressure chronic ulcer of other part of left lower leg with fat layer exposed: Secondary | ICD-10-CM | POA: Diagnosis not present

## 2018-09-29 DIAGNOSIS — I872 Venous insufficiency (chronic) (peripheral): Secondary | ICD-10-CM | POA: Diagnosis not present

## 2018-09-29 DIAGNOSIS — L97812 Non-pressure chronic ulcer of other part of right lower leg with fat layer exposed: Secondary | ICD-10-CM | POA: Diagnosis not present

## 2018-09-29 DIAGNOSIS — I739 Peripheral vascular disease, unspecified: Secondary | ICD-10-CM | POA: Diagnosis not present

## 2018-09-29 DIAGNOSIS — I10 Essential (primary) hypertension: Secondary | ICD-10-CM | POA: Diagnosis not present

## 2018-10-01 DIAGNOSIS — I872 Venous insufficiency (chronic) (peripheral): Secondary | ICD-10-CM | POA: Diagnosis not present

## 2018-10-01 DIAGNOSIS — L97822 Non-pressure chronic ulcer of other part of left lower leg with fat layer exposed: Secondary | ICD-10-CM | POA: Diagnosis not present

## 2018-10-01 DIAGNOSIS — I10 Essential (primary) hypertension: Secondary | ICD-10-CM | POA: Diagnosis not present

## 2018-10-01 DIAGNOSIS — I739 Peripheral vascular disease, unspecified: Secondary | ICD-10-CM | POA: Diagnosis not present

## 2018-10-01 DIAGNOSIS — B965 Pseudomonas (aeruginosa) (mallei) (pseudomallei) as the cause of diseases classified elsewhere: Secondary | ICD-10-CM | POA: Diagnosis not present

## 2018-10-01 DIAGNOSIS — L97812 Non-pressure chronic ulcer of other part of right lower leg with fat layer exposed: Secondary | ICD-10-CM | POA: Diagnosis not present

## 2018-10-03 DIAGNOSIS — L97822 Non-pressure chronic ulcer of other part of left lower leg with fat layer exposed: Secondary | ICD-10-CM | POA: Diagnosis not present

## 2018-10-03 DIAGNOSIS — L97812 Non-pressure chronic ulcer of other part of right lower leg with fat layer exposed: Secondary | ICD-10-CM | POA: Diagnosis not present

## 2018-10-03 DIAGNOSIS — B965 Pseudomonas (aeruginosa) (mallei) (pseudomallei) as the cause of diseases classified elsewhere: Secondary | ICD-10-CM | POA: Diagnosis not present

## 2018-10-03 DIAGNOSIS — I739 Peripheral vascular disease, unspecified: Secondary | ICD-10-CM | POA: Diagnosis not present

## 2018-10-03 DIAGNOSIS — I872 Venous insufficiency (chronic) (peripheral): Secondary | ICD-10-CM | POA: Diagnosis not present

## 2018-10-03 DIAGNOSIS — I10 Essential (primary) hypertension: Secondary | ICD-10-CM | POA: Diagnosis not present

## 2018-10-06 DIAGNOSIS — I872 Venous insufficiency (chronic) (peripheral): Secondary | ICD-10-CM | POA: Diagnosis not present

## 2018-10-06 DIAGNOSIS — I10 Essential (primary) hypertension: Secondary | ICD-10-CM | POA: Diagnosis not present

## 2018-10-06 DIAGNOSIS — I739 Peripheral vascular disease, unspecified: Secondary | ICD-10-CM | POA: Diagnosis not present

## 2018-10-06 DIAGNOSIS — B965 Pseudomonas (aeruginosa) (mallei) (pseudomallei) as the cause of diseases classified elsewhere: Secondary | ICD-10-CM | POA: Diagnosis not present

## 2018-10-06 DIAGNOSIS — L97812 Non-pressure chronic ulcer of other part of right lower leg with fat layer exposed: Secondary | ICD-10-CM | POA: Diagnosis not present

## 2018-10-06 DIAGNOSIS — L97822 Non-pressure chronic ulcer of other part of left lower leg with fat layer exposed: Secondary | ICD-10-CM | POA: Diagnosis not present

## 2018-10-08 ENCOUNTER — Encounter (HOSPITAL_BASED_OUTPATIENT_CLINIC_OR_DEPARTMENT_OTHER): Payer: Medicare Other | Attending: Physician Assistant

## 2018-10-08 DIAGNOSIS — I10 Essential (primary) hypertension: Secondary | ICD-10-CM | POA: Insufficient documentation

## 2018-10-08 DIAGNOSIS — I872 Venous insufficiency (chronic) (peripheral): Secondary | ICD-10-CM | POA: Insufficient documentation

## 2018-10-08 DIAGNOSIS — L97822 Non-pressure chronic ulcer of other part of left lower leg with fat layer exposed: Secondary | ICD-10-CM | POA: Diagnosis not present

## 2018-10-08 DIAGNOSIS — F0390 Unspecified dementia without behavioral disturbance: Secondary | ICD-10-CM | POA: Insufficient documentation

## 2018-10-08 DIAGNOSIS — I739 Peripheral vascular disease, unspecified: Secondary | ICD-10-CM | POA: Insufficient documentation

## 2018-10-08 DIAGNOSIS — L97812 Non-pressure chronic ulcer of other part of right lower leg with fat layer exposed: Secondary | ICD-10-CM | POA: Diagnosis not present

## 2018-10-09 ENCOUNTER — Ambulatory Visit: Payer: Medicare Other | Admitting: Neurology

## 2018-10-10 DIAGNOSIS — B965 Pseudomonas (aeruginosa) (mallei) (pseudomallei) as the cause of diseases classified elsewhere: Secondary | ICD-10-CM | POA: Diagnosis not present

## 2018-10-10 DIAGNOSIS — I739 Peripheral vascular disease, unspecified: Secondary | ICD-10-CM | POA: Diagnosis not present

## 2018-10-10 DIAGNOSIS — L97822 Non-pressure chronic ulcer of other part of left lower leg with fat layer exposed: Secondary | ICD-10-CM | POA: Diagnosis not present

## 2018-10-10 DIAGNOSIS — I10 Essential (primary) hypertension: Secondary | ICD-10-CM | POA: Diagnosis not present

## 2018-10-10 DIAGNOSIS — L97812 Non-pressure chronic ulcer of other part of right lower leg with fat layer exposed: Secondary | ICD-10-CM | POA: Diagnosis not present

## 2018-10-10 DIAGNOSIS — I872 Venous insufficiency (chronic) (peripheral): Secondary | ICD-10-CM | POA: Diagnosis not present

## 2018-10-13 DIAGNOSIS — I10 Essential (primary) hypertension: Secondary | ICD-10-CM | POA: Diagnosis not present

## 2018-10-13 DIAGNOSIS — B965 Pseudomonas (aeruginosa) (mallei) (pseudomallei) as the cause of diseases classified elsewhere: Secondary | ICD-10-CM | POA: Diagnosis not present

## 2018-10-13 DIAGNOSIS — L97822 Non-pressure chronic ulcer of other part of left lower leg with fat layer exposed: Secondary | ICD-10-CM | POA: Diagnosis not present

## 2018-10-13 DIAGNOSIS — I872 Venous insufficiency (chronic) (peripheral): Secondary | ICD-10-CM | POA: Diagnosis not present

## 2018-10-13 DIAGNOSIS — L97812 Non-pressure chronic ulcer of other part of right lower leg with fat layer exposed: Secondary | ICD-10-CM | POA: Diagnosis not present

## 2018-10-13 DIAGNOSIS — I739 Peripheral vascular disease, unspecified: Secondary | ICD-10-CM | POA: Diagnosis not present

## 2018-10-15 DIAGNOSIS — L97822 Non-pressure chronic ulcer of other part of left lower leg with fat layer exposed: Secondary | ICD-10-CM | POA: Diagnosis not present

## 2018-10-15 DIAGNOSIS — L97812 Non-pressure chronic ulcer of other part of right lower leg with fat layer exposed: Secondary | ICD-10-CM | POA: Diagnosis not present

## 2018-10-15 DIAGNOSIS — B965 Pseudomonas (aeruginosa) (mallei) (pseudomallei) as the cause of diseases classified elsewhere: Secondary | ICD-10-CM | POA: Diagnosis not present

## 2018-10-15 DIAGNOSIS — I872 Venous insufficiency (chronic) (peripheral): Secondary | ICD-10-CM | POA: Diagnosis not present

## 2018-10-15 DIAGNOSIS — I739 Peripheral vascular disease, unspecified: Secondary | ICD-10-CM | POA: Diagnosis not present

## 2018-10-15 DIAGNOSIS — I10 Essential (primary) hypertension: Secondary | ICD-10-CM | POA: Diagnosis not present

## 2018-10-16 ENCOUNTER — Encounter: Payer: Self-pay | Admitting: Vascular Surgery

## 2018-10-16 ENCOUNTER — Ambulatory Visit (HOSPITAL_COMMUNITY)
Admission: RE | Admit: 2018-10-16 | Discharge: 2018-10-16 | Disposition: A | Discharge status: Home / Self Care | Payer: Medicare Other | Source: Ambulatory Visit | Attending: Vascular Surgery | Admitting: Vascular Surgery

## 2018-10-16 ENCOUNTER — Other Ambulatory Visit: Payer: Self-pay | Admitting: *Deleted

## 2018-10-16 ENCOUNTER — Ambulatory Visit (INDEPENDENT_AMBULATORY_CARE_PROVIDER_SITE_OTHER): Payer: Medicare Other | Admitting: Vascular Surgery

## 2018-10-16 VITALS — BP 124/65 | HR 47 | Temp 97.4°F | Resp 18 | Ht 66.6 in | Wt 145.7 lb

## 2018-10-16 DIAGNOSIS — I83019 Varicose veins of right lower extremity with ulcer of unspecified site: Secondary | ICD-10-CM | POA: Diagnosis not present

## 2018-10-16 DIAGNOSIS — I83813 Varicose veins of bilateral lower extremities with pain: Secondary | ICD-10-CM | POA: Diagnosis not present

## 2018-10-16 DIAGNOSIS — I83029 Varicose veins of left lower extremity with ulcer of unspecified site: Secondary | ICD-10-CM | POA: Diagnosis not present

## 2018-10-16 DIAGNOSIS — L97919 Non-pressure chronic ulcer of unspecified part of right lower leg with unspecified severity: Secondary | ICD-10-CM

## 2018-10-16 DIAGNOSIS — L97929 Non-pressure chronic ulcer of unspecified part of left lower leg with unspecified severity: Secondary | ICD-10-CM | POA: Diagnosis not present

## 2018-10-16 NOTE — Progress Notes (Signed)
Patient is an 84 year old female who I last saw in October 2019.  At that time she had a several month history of ulcerations over the medial malleolus of both legs.  She was being followed at the wound center.  They were doing compression dressings.  Arterial occlusive disease was ruled out.  Chronic medical problems remain memory deficits hypertension prediabetes bradycardia all of which are currently stable.  Review of systems: She denies chest pain.  She denies shortness of breath.  Family History  Problem Relation Age of Onset  . Pneumonia Mother   . Pulmonary disease Father     Social History   Socioeconomic History  . Marital status: Married    Spouse name: Not on file  . Number of children: Not on file  . Years of education: Not on file  . Highest education level: Not on file  Occupational History  . Not on file  Social Needs  . Financial resource strain: Not on file  . Food insecurity:    Worry: Not on file    Inability: Not on file  . Transportation needs:    Medical: Not on file    Non-medical: Not on file  Tobacco Use  . Smoking status: Never Smoker  . Smokeless tobacco: Never Used  Substance and Sexual Activity  . Alcohol use: No  . Drug use: No  . Sexual activity: Never  Lifestyle  . Physical activity:    Days per week: Not on file    Minutes per session: Not on file  . Stress: Not on file  Relationships  . Social connections:    Talks on phone: Not on file    Gets together: Not on file    Attends religious service: Not on file    Active member of club or organization: Not on file    Attends meetings of clubs or organizations: Not on file    Relationship status: Not on file  . Intimate partner violence:    Fear of current or ex partner: Not on file    Emotionally abused: Not on file    Physically abused: Not on file    Forced sexual activity: Not on file  Other Topics Concern  . Not on file  Social History Narrative  . Not on file      Physical exam:  Vitals:   10/16/18 1542  BP: 124/65  Pulse: (!) 47  Resp: 18  Temp: (!) 97.4 F (36.3 C)  TempSrc: Oral  SpO2: 100%  Weight: 145 lb 11.2 oz (66.1 kg)  Height: 5' 6.6" (1.692 m)   Skin: Patient still with stasis ulcers on the medial aspect of her medial malleolus bilaterally.  Although the ulcers are cleaner on this office visit they are of similar dimensions to when she saw me October 3.  She still has pain in the ulcers of both legs.  Vascular: 2+ dorsalis pedis pulses bilaterally  Data: She had a duplex ultrasound today which showed reflux in the common femoral vein and greater saphenous vein at the saphenofemoral junction on the right side vein diameter was about 4 mm uniformly.  I repeated portions of her ultrasound at the bedside with the SonoSite and confirmed again this was a fairly uniform diameter 4 mm vein.  On the left side she did have evidence of reflux in the left greater saphenous vein but the vein was of smaller diameter and some sections 3-4 but some segments also that may not have been in complete continuity.  Assessment: Patient with venous stasis ulcers bilaterally.  I believe the vein diameter on the right is amenable to laser ablation as well as the fact that she has reflux in this leg and slowly healing venous stasis ulcers.  Procedure would be done to assist with wound healing.  She is CEAP class VI.  Plan: Laser ablation right greater saphenous vein pending insurance approval.  Risk benefits possible complications procedure details were discussed the patient and her husband today.  They wish to proceed.  Ruta Hinds, MD [facility] Office: [phone] Pager: [phone]

## 2018-10-17 DIAGNOSIS — I10 Essential (primary) hypertension: Secondary | ICD-10-CM | POA: Diagnosis not present

## 2018-10-17 DIAGNOSIS — L97822 Non-pressure chronic ulcer of other part of left lower leg with fat layer exposed: Secondary | ICD-10-CM | POA: Diagnosis not present

## 2018-10-17 DIAGNOSIS — I739 Peripheral vascular disease, unspecified: Secondary | ICD-10-CM | POA: Diagnosis not present

## 2018-10-17 DIAGNOSIS — B965 Pseudomonas (aeruginosa) (mallei) (pseudomallei) as the cause of diseases classified elsewhere: Secondary | ICD-10-CM | POA: Diagnosis not present

## 2018-10-17 DIAGNOSIS — L97812 Non-pressure chronic ulcer of other part of right lower leg with fat layer exposed: Secondary | ICD-10-CM | POA: Diagnosis not present

## 2018-10-17 DIAGNOSIS — I872 Venous insufficiency (chronic) (peripheral): Secondary | ICD-10-CM | POA: Diagnosis not present

## 2018-10-20 DIAGNOSIS — I10 Essential (primary) hypertension: Secondary | ICD-10-CM | POA: Diagnosis not present

## 2018-10-20 DIAGNOSIS — L97822 Non-pressure chronic ulcer of other part of left lower leg with fat layer exposed: Secondary | ICD-10-CM | POA: Diagnosis not present

## 2018-10-20 DIAGNOSIS — L97812 Non-pressure chronic ulcer of other part of right lower leg with fat layer exposed: Secondary | ICD-10-CM | POA: Diagnosis not present

## 2018-10-20 DIAGNOSIS — I872 Venous insufficiency (chronic) (peripheral): Secondary | ICD-10-CM | POA: Diagnosis not present

## 2018-10-20 DIAGNOSIS — B965 Pseudomonas (aeruginosa) (mallei) (pseudomallei) as the cause of diseases classified elsewhere: Secondary | ICD-10-CM | POA: Diagnosis not present

## 2018-10-20 DIAGNOSIS — I739 Peripheral vascular disease, unspecified: Secondary | ICD-10-CM | POA: Diagnosis not present

## 2018-10-22 DIAGNOSIS — F0390 Unspecified dementia without behavioral disturbance: Secondary | ICD-10-CM | POA: Diagnosis not present

## 2018-10-22 DIAGNOSIS — I10 Essential (primary) hypertension: Secondary | ICD-10-CM | POA: Diagnosis not present

## 2018-10-22 DIAGNOSIS — I739 Peripheral vascular disease, unspecified: Secondary | ICD-10-CM | POA: Diagnosis not present

## 2018-10-22 DIAGNOSIS — L97812 Non-pressure chronic ulcer of other part of right lower leg with fat layer exposed: Secondary | ICD-10-CM | POA: Diagnosis not present

## 2018-10-22 DIAGNOSIS — I872 Venous insufficiency (chronic) (peripheral): Secondary | ICD-10-CM | POA: Diagnosis not present

## 2018-10-22 DIAGNOSIS — L97822 Non-pressure chronic ulcer of other part of left lower leg with fat layer exposed: Secondary | ICD-10-CM | POA: Diagnosis not present

## 2018-10-25 DIAGNOSIS — I739 Peripheral vascular disease, unspecified: Secondary | ICD-10-CM | POA: Diagnosis not present

## 2018-10-25 DIAGNOSIS — I872 Venous insufficiency (chronic) (peripheral): Secondary | ICD-10-CM | POA: Diagnosis not present

## 2018-10-25 DIAGNOSIS — I10 Essential (primary) hypertension: Secondary | ICD-10-CM | POA: Diagnosis not present

## 2018-10-25 DIAGNOSIS — L97822 Non-pressure chronic ulcer of other part of left lower leg with fat layer exposed: Secondary | ICD-10-CM | POA: Diagnosis not present

## 2018-10-25 DIAGNOSIS — B965 Pseudomonas (aeruginosa) (mallei) (pseudomallei) as the cause of diseases classified elsewhere: Secondary | ICD-10-CM | POA: Diagnosis not present

## 2018-10-25 DIAGNOSIS — L97812 Non-pressure chronic ulcer of other part of right lower leg with fat layer exposed: Secondary | ICD-10-CM | POA: Diagnosis not present

## 2018-10-27 DIAGNOSIS — I739 Peripheral vascular disease, unspecified: Secondary | ICD-10-CM | POA: Diagnosis not present

## 2018-10-27 DIAGNOSIS — I10 Essential (primary) hypertension: Secondary | ICD-10-CM | POA: Diagnosis not present

## 2018-10-27 DIAGNOSIS — L97822 Non-pressure chronic ulcer of other part of left lower leg with fat layer exposed: Secondary | ICD-10-CM | POA: Diagnosis not present

## 2018-10-27 DIAGNOSIS — B965 Pseudomonas (aeruginosa) (mallei) (pseudomallei) as the cause of diseases classified elsewhere: Secondary | ICD-10-CM | POA: Diagnosis not present

## 2018-10-27 DIAGNOSIS — I872 Venous insufficiency (chronic) (peripheral): Secondary | ICD-10-CM | POA: Diagnosis not present

## 2018-10-27 DIAGNOSIS — L97812 Non-pressure chronic ulcer of other part of right lower leg with fat layer exposed: Secondary | ICD-10-CM | POA: Diagnosis not present

## 2018-10-29 DIAGNOSIS — I739 Peripheral vascular disease, unspecified: Secondary | ICD-10-CM | POA: Diagnosis not present

## 2018-10-29 DIAGNOSIS — I10 Essential (primary) hypertension: Secondary | ICD-10-CM | POA: Diagnosis not present

## 2018-10-29 DIAGNOSIS — L97812 Non-pressure chronic ulcer of other part of right lower leg with fat layer exposed: Secondary | ICD-10-CM | POA: Diagnosis not present

## 2018-10-29 DIAGNOSIS — B965 Pseudomonas (aeruginosa) (mallei) (pseudomallei) as the cause of diseases classified elsewhere: Secondary | ICD-10-CM | POA: Diagnosis not present

## 2018-10-29 DIAGNOSIS — I872 Venous insufficiency (chronic) (peripheral): Secondary | ICD-10-CM | POA: Diagnosis not present

## 2018-10-29 DIAGNOSIS — L97822 Non-pressure chronic ulcer of other part of left lower leg with fat layer exposed: Secondary | ICD-10-CM | POA: Diagnosis not present

## 2018-10-31 ENCOUNTER — Ambulatory Visit: Payer: Medicare Other | Admitting: Neurology

## 2018-10-31 DIAGNOSIS — I872 Venous insufficiency (chronic) (peripheral): Secondary | ICD-10-CM | POA: Diagnosis not present

## 2018-10-31 DIAGNOSIS — L97812 Non-pressure chronic ulcer of other part of right lower leg with fat layer exposed: Secondary | ICD-10-CM | POA: Diagnosis not present

## 2018-10-31 DIAGNOSIS — L97822 Non-pressure chronic ulcer of other part of left lower leg with fat layer exposed: Secondary | ICD-10-CM | POA: Diagnosis not present

## 2018-10-31 DIAGNOSIS — B965 Pseudomonas (aeruginosa) (mallei) (pseudomallei) as the cause of diseases classified elsewhere: Secondary | ICD-10-CM | POA: Diagnosis not present

## 2018-10-31 DIAGNOSIS — I10 Essential (primary) hypertension: Secondary | ICD-10-CM | POA: Diagnosis not present

## 2018-10-31 DIAGNOSIS — I739 Peripheral vascular disease, unspecified: Secondary | ICD-10-CM | POA: Diagnosis not present

## 2018-11-03 DIAGNOSIS — L97822 Non-pressure chronic ulcer of other part of left lower leg with fat layer exposed: Secondary | ICD-10-CM | POA: Diagnosis not present

## 2018-11-03 DIAGNOSIS — I872 Venous insufficiency (chronic) (peripheral): Secondary | ICD-10-CM | POA: Diagnosis not present

## 2018-11-03 DIAGNOSIS — B965 Pseudomonas (aeruginosa) (mallei) (pseudomallei) as the cause of diseases classified elsewhere: Secondary | ICD-10-CM | POA: Diagnosis not present

## 2018-11-03 DIAGNOSIS — I10 Essential (primary) hypertension: Secondary | ICD-10-CM | POA: Diagnosis not present

## 2018-11-03 DIAGNOSIS — I739 Peripheral vascular disease, unspecified: Secondary | ICD-10-CM | POA: Diagnosis not present

## 2018-11-03 DIAGNOSIS — L97812 Non-pressure chronic ulcer of other part of right lower leg with fat layer exposed: Secondary | ICD-10-CM | POA: Diagnosis not present

## 2018-11-05 ENCOUNTER — Encounter (HOSPITAL_BASED_OUTPATIENT_CLINIC_OR_DEPARTMENT_OTHER): Payer: Medicare Other | Attending: Internal Medicine

## 2018-11-05 DIAGNOSIS — I10 Essential (primary) hypertension: Secondary | ICD-10-CM | POA: Diagnosis not present

## 2018-11-05 DIAGNOSIS — L97812 Non-pressure chronic ulcer of other part of right lower leg with fat layer exposed: Secondary | ICD-10-CM | POA: Diagnosis not present

## 2018-11-05 DIAGNOSIS — I872 Venous insufficiency (chronic) (peripheral): Secondary | ICD-10-CM | POA: Diagnosis not present

## 2018-11-05 DIAGNOSIS — I87313 Chronic venous hypertension (idiopathic) with ulcer of bilateral lower extremity: Secondary | ICD-10-CM | POA: Diagnosis not present

## 2018-11-05 DIAGNOSIS — L97822 Non-pressure chronic ulcer of other part of left lower leg with fat layer exposed: Secondary | ICD-10-CM | POA: Diagnosis not present

## 2018-11-05 DIAGNOSIS — L539 Erythematous condition, unspecified: Secondary | ICD-10-CM | POA: Diagnosis not present

## 2018-11-06 ENCOUNTER — Encounter (HOSPITAL_BASED_OUTPATIENT_CLINIC_OR_DEPARTMENT_OTHER): Payer: Medicare Other

## 2018-11-07 DIAGNOSIS — L97822 Non-pressure chronic ulcer of other part of left lower leg with fat layer exposed: Secondary | ICD-10-CM | POA: Diagnosis not present

## 2018-11-07 DIAGNOSIS — I10 Essential (primary) hypertension: Secondary | ICD-10-CM | POA: Diagnosis not present

## 2018-11-07 DIAGNOSIS — B965 Pseudomonas (aeruginosa) (mallei) (pseudomallei) as the cause of diseases classified elsewhere: Secondary | ICD-10-CM | POA: Diagnosis not present

## 2018-11-07 DIAGNOSIS — I739 Peripheral vascular disease, unspecified: Secondary | ICD-10-CM | POA: Diagnosis not present

## 2018-11-07 DIAGNOSIS — I872 Venous insufficiency (chronic) (peripheral): Secondary | ICD-10-CM | POA: Diagnosis not present

## 2018-11-07 DIAGNOSIS — L97812 Non-pressure chronic ulcer of other part of right lower leg with fat layer exposed: Secondary | ICD-10-CM | POA: Diagnosis not present

## 2018-11-10 ENCOUNTER — Telehealth: Payer: Self-pay | Admitting: *Deleted

## 2018-11-10 DIAGNOSIS — I739 Peripheral vascular disease, unspecified: Secondary | ICD-10-CM | POA: Diagnosis not present

## 2018-11-10 DIAGNOSIS — L97822 Non-pressure chronic ulcer of other part of left lower leg with fat layer exposed: Secondary | ICD-10-CM | POA: Diagnosis not present

## 2018-11-10 DIAGNOSIS — I872 Venous insufficiency (chronic) (peripheral): Secondary | ICD-10-CM | POA: Diagnosis not present

## 2018-11-10 DIAGNOSIS — L97812 Non-pressure chronic ulcer of other part of right lower leg with fat layer exposed: Secondary | ICD-10-CM | POA: Diagnosis not present

## 2018-11-10 DIAGNOSIS — I10 Essential (primary) hypertension: Secondary | ICD-10-CM | POA: Diagnosis not present

## 2018-11-10 DIAGNOSIS — B965 Pseudomonas (aeruginosa) (mallei) (pseudomallei) as the cause of diseases classified elsewhere: Secondary | ICD-10-CM | POA: Diagnosis not present

## 2018-11-10 NOTE — Telephone Encounter (Signed)
Spoke with Mr. Gillham' husband to confirm that she was cancelling her laser ablation procedure scheduled on 11-12-2018 2:00PM with Dr. Oneida Alar. Mr. Sole stated that his wife "was not feeling well" and wanted to cancel the appointment on 11-12-2018 and reschedule it.  Mr. Olsson states that his wife will call back to reschedule the laser ablation procedure.

## 2018-11-12 ENCOUNTER — Other Ambulatory Visit: Payer: Medicare Other | Admitting: Vascular Surgery

## 2018-11-12 DIAGNOSIS — L97822 Non-pressure chronic ulcer of other part of left lower leg with fat layer exposed: Secondary | ICD-10-CM | POA: Diagnosis not present

## 2018-11-12 DIAGNOSIS — B965 Pseudomonas (aeruginosa) (mallei) (pseudomallei) as the cause of diseases classified elsewhere: Secondary | ICD-10-CM | POA: Diagnosis not present

## 2018-11-12 DIAGNOSIS — I10 Essential (primary) hypertension: Secondary | ICD-10-CM | POA: Diagnosis not present

## 2018-11-12 DIAGNOSIS — L97812 Non-pressure chronic ulcer of other part of right lower leg with fat layer exposed: Secondary | ICD-10-CM | POA: Diagnosis not present

## 2018-11-12 DIAGNOSIS — I739 Peripheral vascular disease, unspecified: Secondary | ICD-10-CM | POA: Diagnosis not present

## 2018-11-12 DIAGNOSIS — I872 Venous insufficiency (chronic) (peripheral): Secondary | ICD-10-CM | POA: Diagnosis not present

## 2018-11-14 DIAGNOSIS — I739 Peripheral vascular disease, unspecified: Secondary | ICD-10-CM | POA: Diagnosis not present

## 2018-11-14 DIAGNOSIS — L97812 Non-pressure chronic ulcer of other part of right lower leg with fat layer exposed: Secondary | ICD-10-CM | POA: Diagnosis not present

## 2018-11-14 DIAGNOSIS — I872 Venous insufficiency (chronic) (peripheral): Secondary | ICD-10-CM | POA: Diagnosis not present

## 2018-11-14 DIAGNOSIS — I10 Essential (primary) hypertension: Secondary | ICD-10-CM | POA: Diagnosis not present

## 2018-11-14 DIAGNOSIS — B965 Pseudomonas (aeruginosa) (mallei) (pseudomallei) as the cause of diseases classified elsewhere: Secondary | ICD-10-CM | POA: Diagnosis not present

## 2018-11-14 DIAGNOSIS — L97822 Non-pressure chronic ulcer of other part of left lower leg with fat layer exposed: Secondary | ICD-10-CM | POA: Diagnosis not present

## 2018-11-16 DIAGNOSIS — B965 Pseudomonas (aeruginosa) (mallei) (pseudomallei) as the cause of diseases classified elsewhere: Secondary | ICD-10-CM | POA: Diagnosis not present

## 2018-11-16 DIAGNOSIS — I10 Essential (primary) hypertension: Secondary | ICD-10-CM | POA: Diagnosis not present

## 2018-11-16 DIAGNOSIS — I739 Peripheral vascular disease, unspecified: Secondary | ICD-10-CM | POA: Diagnosis not present

## 2018-11-16 DIAGNOSIS — L97822 Non-pressure chronic ulcer of other part of left lower leg with fat layer exposed: Secondary | ICD-10-CM | POA: Diagnosis not present

## 2018-11-16 DIAGNOSIS — I872 Venous insufficiency (chronic) (peripheral): Secondary | ICD-10-CM | POA: Diagnosis not present

## 2018-11-16 DIAGNOSIS — L97812 Non-pressure chronic ulcer of other part of right lower leg with fat layer exposed: Secondary | ICD-10-CM | POA: Diagnosis not present

## 2018-11-18 DIAGNOSIS — I739 Peripheral vascular disease, unspecified: Secondary | ICD-10-CM | POA: Diagnosis not present

## 2018-11-18 DIAGNOSIS — B965 Pseudomonas (aeruginosa) (mallei) (pseudomallei) as the cause of diseases classified elsewhere: Secondary | ICD-10-CM | POA: Diagnosis not present

## 2018-11-18 DIAGNOSIS — I10 Essential (primary) hypertension: Secondary | ICD-10-CM | POA: Diagnosis not present

## 2018-11-18 DIAGNOSIS — L97822 Non-pressure chronic ulcer of other part of left lower leg with fat layer exposed: Secondary | ICD-10-CM | POA: Diagnosis not present

## 2018-11-18 DIAGNOSIS — L97812 Non-pressure chronic ulcer of other part of right lower leg with fat layer exposed: Secondary | ICD-10-CM | POA: Diagnosis not present

## 2018-11-18 DIAGNOSIS — I872 Venous insufficiency (chronic) (peripheral): Secondary | ICD-10-CM | POA: Diagnosis not present

## 2018-11-20 DIAGNOSIS — I739 Peripheral vascular disease, unspecified: Secondary | ICD-10-CM | POA: Diagnosis not present

## 2018-11-20 DIAGNOSIS — I872 Venous insufficiency (chronic) (peripheral): Secondary | ICD-10-CM | POA: Diagnosis not present

## 2018-11-20 DIAGNOSIS — B965 Pseudomonas (aeruginosa) (mallei) (pseudomallei) as the cause of diseases classified elsewhere: Secondary | ICD-10-CM | POA: Diagnosis not present

## 2018-11-20 DIAGNOSIS — L97812 Non-pressure chronic ulcer of other part of right lower leg with fat layer exposed: Secondary | ICD-10-CM | POA: Diagnosis not present

## 2018-11-20 DIAGNOSIS — I10 Essential (primary) hypertension: Secondary | ICD-10-CM | POA: Diagnosis not present

## 2018-11-20 DIAGNOSIS — L97822 Non-pressure chronic ulcer of other part of left lower leg with fat layer exposed: Secondary | ICD-10-CM | POA: Diagnosis not present

## 2018-11-22 DIAGNOSIS — L97822 Non-pressure chronic ulcer of other part of left lower leg with fat layer exposed: Secondary | ICD-10-CM | POA: Diagnosis not present

## 2018-11-22 DIAGNOSIS — K219 Gastro-esophageal reflux disease without esophagitis: Secondary | ICD-10-CM | POA: Diagnosis not present

## 2018-11-22 DIAGNOSIS — F0390 Unspecified dementia without behavioral disturbance: Secondary | ICD-10-CM | POA: Diagnosis not present

## 2018-11-22 DIAGNOSIS — M810 Age-related osteoporosis without current pathological fracture: Secondary | ICD-10-CM | POA: Diagnosis not present

## 2018-11-22 DIAGNOSIS — I872 Venous insufficiency (chronic) (peripheral): Secondary | ICD-10-CM | POA: Diagnosis not present

## 2018-11-22 DIAGNOSIS — I10 Essential (primary) hypertension: Secondary | ICD-10-CM | POA: Diagnosis not present

## 2018-11-22 DIAGNOSIS — B965 Pseudomonas (aeruginosa) (mallei) (pseudomallei) as the cause of diseases classified elsewhere: Secondary | ICD-10-CM | POA: Diagnosis not present

## 2018-11-22 DIAGNOSIS — Z9181 History of falling: Secondary | ICD-10-CM | POA: Diagnosis not present

## 2018-11-22 DIAGNOSIS — I4891 Unspecified atrial fibrillation: Secondary | ICD-10-CM | POA: Diagnosis not present

## 2018-11-22 DIAGNOSIS — Z96652 Presence of left artificial knee joint: Secondary | ICD-10-CM | POA: Diagnosis not present

## 2018-11-22 DIAGNOSIS — L97812 Non-pressure chronic ulcer of other part of right lower leg with fat layer exposed: Secondary | ICD-10-CM | POA: Diagnosis not present

## 2018-11-22 DIAGNOSIS — I739 Peripheral vascular disease, unspecified: Secondary | ICD-10-CM | POA: Diagnosis not present

## 2018-11-24 DIAGNOSIS — I739 Peripheral vascular disease, unspecified: Secondary | ICD-10-CM | POA: Diagnosis not present

## 2018-11-24 DIAGNOSIS — I10 Essential (primary) hypertension: Secondary | ICD-10-CM | POA: Diagnosis not present

## 2018-11-24 DIAGNOSIS — L97812 Non-pressure chronic ulcer of other part of right lower leg with fat layer exposed: Secondary | ICD-10-CM | POA: Diagnosis not present

## 2018-11-24 DIAGNOSIS — I872 Venous insufficiency (chronic) (peripheral): Secondary | ICD-10-CM | POA: Diagnosis not present

## 2018-11-24 DIAGNOSIS — B965 Pseudomonas (aeruginosa) (mallei) (pseudomallei) as the cause of diseases classified elsewhere: Secondary | ICD-10-CM | POA: Diagnosis not present

## 2018-11-24 DIAGNOSIS — L97822 Non-pressure chronic ulcer of other part of left lower leg with fat layer exposed: Secondary | ICD-10-CM | POA: Diagnosis not present

## 2018-11-27 ENCOUNTER — Encounter (HOSPITAL_BASED_OUTPATIENT_CLINIC_OR_DEPARTMENT_OTHER): Payer: Medicare Other | Attending: Internal Medicine

## 2018-11-27 ENCOUNTER — Ambulatory Visit: Payer: Medicare Other | Admitting: Vascular Surgery

## 2018-11-27 ENCOUNTER — Encounter (HOSPITAL_COMMUNITY): Payer: Medicare Other

## 2018-11-27 DIAGNOSIS — L97812 Non-pressure chronic ulcer of other part of right lower leg with fat layer exposed: Secondary | ICD-10-CM | POA: Diagnosis not present

## 2018-11-27 DIAGNOSIS — F0390 Unspecified dementia without behavioral disturbance: Secondary | ICD-10-CM | POA: Insufficient documentation

## 2018-11-27 DIAGNOSIS — I10 Essential (primary) hypertension: Secondary | ICD-10-CM | POA: Insufficient documentation

## 2018-11-27 DIAGNOSIS — I872 Venous insufficiency (chronic) (peripheral): Secondary | ICD-10-CM | POA: Insufficient documentation

## 2018-11-27 DIAGNOSIS — L97312 Non-pressure chronic ulcer of right ankle with fat layer exposed: Secondary | ICD-10-CM | POA: Diagnosis not present

## 2018-11-27 DIAGNOSIS — I739 Peripheral vascular disease, unspecified: Secondary | ICD-10-CM | POA: Diagnosis not present

## 2018-11-27 DIAGNOSIS — L97322 Non-pressure chronic ulcer of left ankle with fat layer exposed: Secondary | ICD-10-CM | POA: Diagnosis not present

## 2018-11-27 DIAGNOSIS — L97822 Non-pressure chronic ulcer of other part of left lower leg with fat layer exposed: Secondary | ICD-10-CM | POA: Insufficient documentation

## 2018-12-01 DIAGNOSIS — L97812 Non-pressure chronic ulcer of other part of right lower leg with fat layer exposed: Secondary | ICD-10-CM | POA: Diagnosis not present

## 2018-12-01 DIAGNOSIS — I10 Essential (primary) hypertension: Secondary | ICD-10-CM | POA: Diagnosis not present

## 2018-12-01 DIAGNOSIS — I872 Venous insufficiency (chronic) (peripheral): Secondary | ICD-10-CM | POA: Diagnosis not present

## 2018-12-01 DIAGNOSIS — L97822 Non-pressure chronic ulcer of other part of left lower leg with fat layer exposed: Secondary | ICD-10-CM | POA: Diagnosis not present

## 2018-12-01 DIAGNOSIS — I739 Peripheral vascular disease, unspecified: Secondary | ICD-10-CM | POA: Diagnosis not present

## 2018-12-01 DIAGNOSIS — B965 Pseudomonas (aeruginosa) (mallei) (pseudomallei) as the cause of diseases classified elsewhere: Secondary | ICD-10-CM | POA: Diagnosis not present

## 2018-12-03 DIAGNOSIS — I739 Peripheral vascular disease, unspecified: Secondary | ICD-10-CM | POA: Diagnosis not present

## 2018-12-03 DIAGNOSIS — L97812 Non-pressure chronic ulcer of other part of right lower leg with fat layer exposed: Secondary | ICD-10-CM | POA: Diagnosis not present

## 2018-12-03 DIAGNOSIS — L97822 Non-pressure chronic ulcer of other part of left lower leg with fat layer exposed: Secondary | ICD-10-CM | POA: Diagnosis not present

## 2018-12-03 DIAGNOSIS — B965 Pseudomonas (aeruginosa) (mallei) (pseudomallei) as the cause of diseases classified elsewhere: Secondary | ICD-10-CM | POA: Diagnosis not present

## 2018-12-03 DIAGNOSIS — I872 Venous insufficiency (chronic) (peripheral): Secondary | ICD-10-CM | POA: Diagnosis not present

## 2018-12-03 DIAGNOSIS — I10 Essential (primary) hypertension: Secondary | ICD-10-CM | POA: Diagnosis not present

## 2018-12-05 DIAGNOSIS — B965 Pseudomonas (aeruginosa) (mallei) (pseudomallei) as the cause of diseases classified elsewhere: Secondary | ICD-10-CM | POA: Diagnosis not present

## 2018-12-05 DIAGNOSIS — L97812 Non-pressure chronic ulcer of other part of right lower leg with fat layer exposed: Secondary | ICD-10-CM | POA: Diagnosis not present

## 2018-12-05 DIAGNOSIS — I872 Venous insufficiency (chronic) (peripheral): Secondary | ICD-10-CM | POA: Diagnosis not present

## 2018-12-05 DIAGNOSIS — I10 Essential (primary) hypertension: Secondary | ICD-10-CM | POA: Diagnosis not present

## 2018-12-05 DIAGNOSIS — L97822 Non-pressure chronic ulcer of other part of left lower leg with fat layer exposed: Secondary | ICD-10-CM | POA: Diagnosis not present

## 2018-12-05 DIAGNOSIS — I739 Peripheral vascular disease, unspecified: Secondary | ICD-10-CM | POA: Diagnosis not present

## 2018-12-06 ENCOUNTER — Other Ambulatory Visit: Payer: Self-pay | Admitting: Internal Medicine

## 2018-12-08 DIAGNOSIS — L97812 Non-pressure chronic ulcer of other part of right lower leg with fat layer exposed: Secondary | ICD-10-CM | POA: Diagnosis not present

## 2018-12-08 DIAGNOSIS — L209 Atopic dermatitis, unspecified: Secondary | ICD-10-CM | POA: Diagnosis not present

## 2018-12-08 DIAGNOSIS — L97822 Non-pressure chronic ulcer of other part of left lower leg with fat layer exposed: Secondary | ICD-10-CM | POA: Diagnosis not present

## 2018-12-08 DIAGNOSIS — I10 Essential (primary) hypertension: Secondary | ICD-10-CM | POA: Diagnosis not present

## 2018-12-08 DIAGNOSIS — I872 Venous insufficiency (chronic) (peripheral): Secondary | ICD-10-CM | POA: Diagnosis not present

## 2018-12-08 DIAGNOSIS — I739 Peripheral vascular disease, unspecified: Secondary | ICD-10-CM | POA: Diagnosis not present

## 2018-12-08 DIAGNOSIS — B965 Pseudomonas (aeruginosa) (mallei) (pseudomallei) as the cause of diseases classified elsewhere: Secondary | ICD-10-CM | POA: Diagnosis not present

## 2018-12-10 DIAGNOSIS — F0390 Unspecified dementia without behavioral disturbance: Secondary | ICD-10-CM | POA: Diagnosis not present

## 2018-12-10 DIAGNOSIS — I10 Essential (primary) hypertension: Secondary | ICD-10-CM | POA: Diagnosis not present

## 2018-12-10 DIAGNOSIS — I872 Venous insufficiency (chronic) (peripheral): Secondary | ICD-10-CM | POA: Diagnosis not present

## 2018-12-10 DIAGNOSIS — I739 Peripheral vascular disease, unspecified: Secondary | ICD-10-CM | POA: Diagnosis not present

## 2018-12-10 DIAGNOSIS — L97322 Non-pressure chronic ulcer of left ankle with fat layer exposed: Secondary | ICD-10-CM | POA: Diagnosis not present

## 2018-12-10 DIAGNOSIS — L97312 Non-pressure chronic ulcer of right ankle with fat layer exposed: Secondary | ICD-10-CM | POA: Diagnosis not present

## 2018-12-10 DIAGNOSIS — L97812 Non-pressure chronic ulcer of other part of right lower leg with fat layer exposed: Secondary | ICD-10-CM | POA: Diagnosis not present

## 2018-12-10 DIAGNOSIS — L97822 Non-pressure chronic ulcer of other part of left lower leg with fat layer exposed: Secondary | ICD-10-CM | POA: Diagnosis not present

## 2018-12-12 DIAGNOSIS — I872 Venous insufficiency (chronic) (peripheral): Secondary | ICD-10-CM | POA: Diagnosis not present

## 2018-12-12 DIAGNOSIS — L97822 Non-pressure chronic ulcer of other part of left lower leg with fat layer exposed: Secondary | ICD-10-CM | POA: Diagnosis not present

## 2018-12-12 DIAGNOSIS — B965 Pseudomonas (aeruginosa) (mallei) (pseudomallei) as the cause of diseases classified elsewhere: Secondary | ICD-10-CM | POA: Diagnosis not present

## 2018-12-12 DIAGNOSIS — I739 Peripheral vascular disease, unspecified: Secondary | ICD-10-CM | POA: Diagnosis not present

## 2018-12-12 DIAGNOSIS — L97812 Non-pressure chronic ulcer of other part of right lower leg with fat layer exposed: Secondary | ICD-10-CM | POA: Diagnosis not present

## 2018-12-12 DIAGNOSIS — I10 Essential (primary) hypertension: Secondary | ICD-10-CM | POA: Diagnosis not present

## 2018-12-15 DIAGNOSIS — B965 Pseudomonas (aeruginosa) (mallei) (pseudomallei) as the cause of diseases classified elsewhere: Secondary | ICD-10-CM | POA: Diagnosis not present

## 2018-12-15 DIAGNOSIS — I739 Peripheral vascular disease, unspecified: Secondary | ICD-10-CM | POA: Diagnosis not present

## 2018-12-15 DIAGNOSIS — L97822 Non-pressure chronic ulcer of other part of left lower leg with fat layer exposed: Secondary | ICD-10-CM | POA: Diagnosis not present

## 2018-12-15 DIAGNOSIS — I872 Venous insufficiency (chronic) (peripheral): Secondary | ICD-10-CM | POA: Diagnosis not present

## 2018-12-15 DIAGNOSIS — I10 Essential (primary) hypertension: Secondary | ICD-10-CM | POA: Diagnosis not present

## 2018-12-15 DIAGNOSIS — L97812 Non-pressure chronic ulcer of other part of right lower leg with fat layer exposed: Secondary | ICD-10-CM | POA: Diagnosis not present

## 2018-12-17 DIAGNOSIS — B965 Pseudomonas (aeruginosa) (mallei) (pseudomallei) as the cause of diseases classified elsewhere: Secondary | ICD-10-CM | POA: Diagnosis not present

## 2018-12-17 DIAGNOSIS — I872 Venous insufficiency (chronic) (peripheral): Secondary | ICD-10-CM | POA: Diagnosis not present

## 2018-12-17 DIAGNOSIS — I739 Peripheral vascular disease, unspecified: Secondary | ICD-10-CM | POA: Diagnosis not present

## 2018-12-17 DIAGNOSIS — L97812 Non-pressure chronic ulcer of other part of right lower leg with fat layer exposed: Secondary | ICD-10-CM | POA: Diagnosis not present

## 2018-12-17 DIAGNOSIS — L97822 Non-pressure chronic ulcer of other part of left lower leg with fat layer exposed: Secondary | ICD-10-CM | POA: Diagnosis not present

## 2018-12-17 DIAGNOSIS — I10 Essential (primary) hypertension: Secondary | ICD-10-CM | POA: Diagnosis not present

## 2018-12-18 DIAGNOSIS — A419 Sepsis, unspecified organism: Secondary | ICD-10-CM | POA: Diagnosis not present

## 2018-12-18 DIAGNOSIS — N281 Cyst of kidney, acquired: Secondary | ICD-10-CM | POA: Diagnosis not present

## 2018-12-18 DIAGNOSIS — R5383 Other fatigue: Secondary | ICD-10-CM | POA: Diagnosis not present

## 2018-12-18 DIAGNOSIS — R918 Other nonspecific abnormal finding of lung field: Secondary | ICD-10-CM | POA: Diagnosis not present

## 2018-12-18 DIAGNOSIS — K219 Gastro-esophageal reflux disease without esophagitis: Secondary | ICD-10-CM | POA: Diagnosis present

## 2018-12-18 DIAGNOSIS — I10 Essential (primary) hypertension: Secondary | ICD-10-CM | POA: Diagnosis present

## 2018-12-18 DIAGNOSIS — N179 Acute kidney failure, unspecified: Secondary | ICD-10-CM | POA: Diagnosis not present

## 2018-12-18 DIAGNOSIS — R6520 Severe sepsis without septic shock: Secondary | ICD-10-CM | POA: Diagnosis not present

## 2018-12-18 DIAGNOSIS — R6521 Severe sepsis with septic shock: Secondary | ICD-10-CM | POA: Diagnosis present

## 2018-12-18 DIAGNOSIS — D72825 Bandemia: Secondary | ICD-10-CM | POA: Diagnosis present

## 2018-12-18 DIAGNOSIS — I639 Cerebral infarction, unspecified: Secondary | ICD-10-CM | POA: Diagnosis not present

## 2018-12-18 DIAGNOSIS — N390 Urinary tract infection, site not specified: Secondary | ICD-10-CM | POA: Diagnosis not present

## 2018-12-18 DIAGNOSIS — Z299 Encounter for prophylactic measures, unspecified: Secondary | ICD-10-CM | POA: Diagnosis not present

## 2018-12-18 DIAGNOSIS — R4182 Altered mental status, unspecified: Secondary | ICD-10-CM | POA: Diagnosis not present

## 2018-12-18 DIAGNOSIS — D72829 Elevated white blood cell count, unspecified: Secondary | ICD-10-CM | POA: Diagnosis present

## 2018-12-18 DIAGNOSIS — K4090 Unilateral inguinal hernia, without obstruction or gangrene, not specified as recurrent: Secondary | ICD-10-CM | POA: Diagnosis not present

## 2018-12-18 DIAGNOSIS — R413 Other amnesia: Secondary | ICD-10-CM | POA: Diagnosis present

## 2018-12-18 DIAGNOSIS — R7989 Other specified abnormal findings of blood chemistry: Secondary | ICD-10-CM | POA: Diagnosis not present

## 2018-12-18 DIAGNOSIS — Z79899 Other long term (current) drug therapy: Secondary | ICD-10-CM | POA: Diagnosis not present

## 2018-12-18 DIAGNOSIS — I959 Hypotension, unspecified: Secondary | ICD-10-CM | POA: Diagnosis not present

## 2018-12-18 DIAGNOSIS — R001 Bradycardia, unspecified: Secondary | ICD-10-CM | POA: Diagnosis present

## 2018-12-18 DIAGNOSIS — D509 Iron deficiency anemia, unspecified: Secondary | ICD-10-CM | POA: Diagnosis present

## 2018-12-18 DIAGNOSIS — E872 Acidosis: Secondary | ICD-10-CM | POA: Diagnosis present

## 2018-12-18 DIAGNOSIS — E876 Hypokalemia: Secondary | ICD-10-CM | POA: Diagnosis present

## 2018-12-18 DIAGNOSIS — G9341 Metabolic encephalopathy: Secondary | ICD-10-CM | POA: Diagnosis present

## 2018-12-18 DIAGNOSIS — N1330 Unspecified hydronephrosis: Secondary | ICD-10-CM | POA: Diagnosis not present

## 2018-12-19 DIAGNOSIS — R6521 Severe sepsis with septic shock: Secondary | ICD-10-CM | POA: Diagnosis present

## 2018-12-19 DIAGNOSIS — Z96652 Presence of left artificial knee joint: Secondary | ICD-10-CM | POA: Diagnosis present

## 2018-12-19 DIAGNOSIS — D509 Iron deficiency anemia, unspecified: Secondary | ICD-10-CM | POA: Diagnosis present

## 2018-12-19 DIAGNOSIS — Q6239 Other obstructive defects of renal pelvis and ureter: Secondary | ICD-10-CM | POA: Diagnosis not present

## 2018-12-19 DIAGNOSIS — G3109 Other frontotemporal dementia: Secondary | ICD-10-CM | POA: Diagnosis present

## 2018-12-19 DIAGNOSIS — R41841 Cognitive communication deficit: Secondary | ICD-10-CM | POA: Diagnosis not present

## 2018-12-19 DIAGNOSIS — N179 Acute kidney failure, unspecified: Secondary | ICD-10-CM | POA: Diagnosis not present

## 2018-12-19 DIAGNOSIS — R0602 Shortness of breath: Secondary | ICD-10-CM | POA: Diagnosis not present

## 2018-12-19 DIAGNOSIS — I10 Essential (primary) hypertension: Secondary | ICD-10-CM | POA: Diagnosis present

## 2018-12-19 DIAGNOSIS — R2681 Unsteadiness on feet: Secondary | ICD-10-CM | POA: Diagnosis not present

## 2018-12-19 DIAGNOSIS — G936 Cerebral edema: Secondary | ICD-10-CM | POA: Diagnosis present

## 2018-12-19 DIAGNOSIS — Z741 Need for assistance with personal care: Secondary | ICD-10-CM | POA: Diagnosis not present

## 2018-12-19 DIAGNOSIS — J90 Pleural effusion, not elsewhere classified: Secondary | ICD-10-CM | POA: Diagnosis not present

## 2018-12-19 DIAGNOSIS — Z452 Encounter for adjustment and management of vascular access device: Secondary | ICD-10-CM | POA: Diagnosis not present

## 2018-12-19 DIAGNOSIS — F329 Major depressive disorder, single episode, unspecified: Secondary | ICD-10-CM | POA: Diagnosis not present

## 2018-12-19 DIAGNOSIS — E872 Acidosis: Secondary | ICD-10-CM | POA: Diagnosis not present

## 2018-12-19 DIAGNOSIS — N10 Acute tubulo-interstitial nephritis: Secondary | ICD-10-CM | POA: Diagnosis not present

## 2018-12-19 DIAGNOSIS — N170 Acute kidney failure with tubular necrosis: Secondary | ICD-10-CM | POA: Diagnosis present

## 2018-12-19 DIAGNOSIS — N132 Hydronephrosis with renal and ureteral calculous obstruction: Secondary | ICD-10-CM | POA: Diagnosis not present

## 2018-12-19 DIAGNOSIS — I517 Cardiomegaly: Secondary | ICD-10-CM | POA: Diagnosis not present

## 2018-12-19 DIAGNOSIS — I878 Other specified disorders of veins: Secondary | ICD-10-CM | POA: Diagnosis present

## 2018-12-19 DIAGNOSIS — F0280 Dementia in other diseases classified elsewhere without behavioral disturbance: Secondary | ICD-10-CM | POA: Diagnosis present

## 2018-12-19 DIAGNOSIS — E876 Hypokalemia: Secondary | ICD-10-CM | POA: Diagnosis present

## 2018-12-19 DIAGNOSIS — R404 Transient alteration of awareness: Secondary | ICD-10-CM | POA: Diagnosis not present

## 2018-12-19 DIAGNOSIS — Z299 Encounter for prophylactic measures, unspecified: Secondary | ICD-10-CM | POA: Diagnosis not present

## 2018-12-19 DIAGNOSIS — K219 Gastro-esophageal reflux disease without esophagitis: Secondary | ICD-10-CM | POA: Diagnosis not present

## 2018-12-19 DIAGNOSIS — Z8661 Personal history of infections of the central nervous system: Secondary | ICD-10-CM | POA: Diagnosis not present

## 2018-12-19 DIAGNOSIS — A419 Sepsis, unspecified organism: Secondary | ICD-10-CM | POA: Diagnosis present

## 2018-12-19 DIAGNOSIS — Z79899 Other long term (current) drug therapy: Secondary | ICD-10-CM | POA: Diagnosis not present

## 2018-12-19 DIAGNOSIS — Z66 Do not resuscitate: Secondary | ICD-10-CM | POA: Diagnosis present

## 2018-12-19 DIAGNOSIS — R509 Fever, unspecified: Secondary | ICD-10-CM | POA: Diagnosis not present

## 2018-12-19 DIAGNOSIS — N390 Urinary tract infection, site not specified: Secondary | ICD-10-CM | POA: Diagnosis not present

## 2018-12-19 DIAGNOSIS — D72829 Elevated white blood cell count, unspecified: Secondary | ICD-10-CM | POA: Diagnosis not present

## 2018-12-19 DIAGNOSIS — R2689 Other abnormalities of gait and mobility: Secondary | ICD-10-CM | POA: Diagnosis not present

## 2018-12-19 DIAGNOSIS — R413 Other amnesia: Secondary | ICD-10-CM | POA: Diagnosis not present

## 2018-12-19 DIAGNOSIS — N1330 Unspecified hydronephrosis: Secondary | ICD-10-CM | POA: Diagnosis not present

## 2018-12-19 DIAGNOSIS — Z743 Need for continuous supervision: Secondary | ICD-10-CM | POA: Diagnosis not present

## 2018-12-19 DIAGNOSIS — R001 Bradycardia, unspecified: Secondary | ICD-10-CM | POA: Diagnosis present

## 2018-12-19 DIAGNOSIS — G9341 Metabolic encephalopathy: Secondary | ICD-10-CM | POA: Diagnosis present

## 2018-12-19 DIAGNOSIS — N136 Pyonephrosis: Secondary | ICD-10-CM | POA: Diagnosis present

## 2018-12-19 DIAGNOSIS — Z880 Allergy status to penicillin: Secondary | ICD-10-CM | POA: Diagnosis not present

## 2018-12-19 DIAGNOSIS — Z888 Allergy status to other drugs, medicaments and biological substances status: Secondary | ICD-10-CM | POA: Diagnosis not present

## 2018-12-19 DIAGNOSIS — R279 Unspecified lack of coordination: Secondary | ICD-10-CM | POA: Diagnosis not present

## 2018-12-19 DIAGNOSIS — R7989 Other specified abnormal findings of blood chemistry: Secondary | ICD-10-CM | POA: Diagnosis not present

## 2018-12-19 DIAGNOSIS — N130 Hydronephrosis with ureteropelvic junction obstruction: Secondary | ICD-10-CM | POA: Diagnosis not present

## 2018-12-19 DIAGNOSIS — J9601 Acute respiratory failure with hypoxia: Secondary | ICD-10-CM | POA: Diagnosis present

## 2018-12-19 DIAGNOSIS — I493 Ventricular premature depolarization: Secondary | ICD-10-CM | POA: Diagnosis present

## 2018-12-19 DIAGNOSIS — Z936 Other artificial openings of urinary tract status: Secondary | ICD-10-CM | POA: Diagnosis not present

## 2018-12-19 DIAGNOSIS — N12 Tubulo-interstitial nephritis, not specified as acute or chronic: Secondary | ICD-10-CM | POA: Diagnosis present

## 2018-12-19 DIAGNOSIS — I083 Combined rheumatic disorders of mitral, aortic and tricuspid valves: Secondary | ICD-10-CM | POA: Diagnosis not present

## 2018-12-19 DIAGNOSIS — M6281 Muscle weakness (generalized): Secondary | ICD-10-CM | POA: Diagnosis not present

## 2018-12-20 MED ORDER — NOREPINEPHRINE-SODIUM CHLORIDE 8-0.9 MG/250ML-% IV SOLN
0.10 | INTRAVENOUS | Status: DC
Start: ? — End: 2018-12-20

## 2018-12-20 MED ORDER — FENTANYL CITRATE (PF) 2500 MCG/50ML IJ SOLN
25.00 | INTRAMUSCULAR | Status: DC
Start: ? — End: 2018-12-20

## 2018-12-20 MED ORDER — FENTANYL CITRATE (PF) 2500 MCG/50ML IJ SOLN
50.00 | INTRAMUSCULAR | Status: DC
Start: ? — End: 2018-12-20

## 2018-12-20 MED ORDER — ALBUTEROL SULFATE (2.5 MG/3ML) 0.083% IN NEBU
2.50 | INHALATION_SOLUTION | RESPIRATORY_TRACT | Status: DC
Start: ? — End: 2018-12-20

## 2018-12-20 MED ORDER — SODIUM CHLORIDE 0.9 % IV SOLN
500.00 | INTRAVENOUS | Status: DC
Start: ? — End: 2018-12-20

## 2018-12-20 MED ORDER — NITROGLYCERIN 0.4 MG SL SUBL
.40 | SUBLINGUAL_TABLET | SUBLINGUAL | Status: DC
Start: ? — End: 2018-12-20

## 2018-12-20 MED ORDER — PANTOPRAZOLE SODIUM 20 MG PO TBEC
20.00 | DELAYED_RELEASE_TABLET | ORAL | Status: DC
Start: 2018-12-20 — End: 2018-12-20

## 2018-12-20 MED ORDER — GENERIC EXTERNAL MEDICATION
Status: DC
Start: ? — End: 2018-12-20

## 2018-12-20 MED ORDER — GENERIC EXTERNAL MEDICATION
25.00 | Status: DC
Start: ? — End: 2018-12-20

## 2018-12-20 MED ORDER — MUPIROCIN 2 % EX OINT
TOPICAL_OINTMENT | CUTANEOUS | Status: DC
Start: 2018-12-20 — End: 2018-12-20

## 2018-12-20 MED ORDER — ACETAMINOPHEN 325 MG PO TABS
650.00 | ORAL_TABLET | ORAL | Status: DC
Start: ? — End: 2018-12-20

## 2018-12-20 MED ORDER — GENERIC EXTERNAL MEDICATION
.04 | Status: DC
Start: ? — End: 2018-12-20

## 2018-12-20 MED ORDER — FENTANYL CITRATE (PF) 2500 MCG/50ML IJ SOLN
INTRAMUSCULAR | Status: DC
Start: ? — End: 2018-12-20

## 2018-12-20 MED ORDER — ACETAMINOPHEN 650 MG RE SUPP
650.00 | RECTAL | Status: DC
Start: ? — End: 2018-12-20

## 2018-12-20 MED ORDER — HEPARIN SODIUM (PORCINE) 5000 UNIT/ML IJ SOLN
5000.00 | INTRAMUSCULAR | Status: DC
Start: 2018-12-27 — End: 2018-12-20

## 2018-12-20 MED ORDER — ONDANSETRON HCL 4 MG/2ML IJ SOLN
4.00 | INTRAMUSCULAR | Status: DC
Start: ? — End: 2018-12-20

## 2018-12-20 MED ORDER — GENERIC EXTERNAL MEDICATION
15.00 | Status: DC
Start: ? — End: 2018-12-20

## 2018-12-20 MED ORDER — FENTANYL CITRATE (PF) 2500 MCG/50ML IJ SOLN
100.00 | INTRAMUSCULAR | Status: DC
Start: ? — End: 2018-12-20

## 2018-12-20 MED ORDER — SODIUM CHLORIDE 0.9 % IV SOLN
25.00 | INTRAVENOUS | Status: DC
Start: ? — End: 2018-12-20

## 2018-12-20 MED ORDER — MIDAZOLAM HCL 2 MG/2ML IJ SOLN
INTRAMUSCULAR | Status: DC
Start: ? — End: 2018-12-20

## 2018-12-20 MED ORDER — GENERIC EXTERNAL MEDICATION
1.25 | Status: DC
Start: 2018-12-21 — End: 2018-12-20

## 2018-12-20 MED ORDER — ALBUMIN HUMAN 5 % IV SOLN
500.00 | INTRAVENOUS | Status: DC
Start: ? — End: 2018-12-20

## 2018-12-20 MED ORDER — ACETAMINOPHEN 160 MG/5ML PO SUSP
650.00 | ORAL | Status: DC
Start: ? — End: 2018-12-20

## 2018-12-20 MED ORDER — GENERIC EXTERNAL MEDICATION
1.00 | Status: DC
Start: 2018-12-21 — End: 2018-12-20

## 2018-12-20 MED ORDER — ALUM & MAG HYDROXIDE-SIMETH 200-200-20 MG/5ML PO SUSP
15.00 | ORAL | Status: DC
Start: ? — End: 2018-12-20

## 2018-12-20 MED ORDER — DEXMEDETOMIDINE HCL IN NACL 400 MCG/100ML IV SOLN
0.10 | INTRAVENOUS | Status: DC
Start: ? — End: 2018-12-20

## 2018-12-20 MED ORDER — GENERIC EXTERNAL MEDICATION
1.00 | Status: DC
Start: ? — End: 2018-12-20

## 2018-12-20 MED ORDER — SODIUM CHLORIDE 0.9 % IV SOLN
125.00 | INTRAVENOUS | Status: DC
Start: ? — End: 2018-12-20

## 2018-12-20 MED ORDER — GENERIC EXTERNAL MEDICATION
.08 | Status: DC
Start: ? — End: 2018-12-20

## 2018-12-20 MED ORDER — MUPIROCIN 2 % EX OINT
TOPICAL_OINTMENT | CUTANEOUS | Status: DC
Start: 2018-12-19 — End: 2018-12-20

## 2018-12-20 MED ORDER — CALCIUM CARBONATE 1250 (500 CA) MG PO TABS
1250.00 | ORAL_TABLET | ORAL | Status: DC
Start: 2018-12-19 — End: 2018-12-20

## 2018-12-20 MED ORDER — SODIUM CHLORIDE 0.9 % IV SOLN
100.00 | INTRAVENOUS | Status: DC
Start: ? — End: 2018-12-20

## 2018-12-20 MED ORDER — PANTOPRAZOLE SODIUM 20 MG PO TBEC
20.00 | DELAYED_RELEASE_TABLET | ORAL | Status: DC
Start: 2018-12-27 — End: 2018-12-20

## 2018-12-26 DIAGNOSIS — D509 Iron deficiency anemia, unspecified: Secondary | ICD-10-CM | POA: Diagnosis not present

## 2018-12-26 DIAGNOSIS — N390 Urinary tract infection, site not specified: Secondary | ICD-10-CM | POA: Diagnosis not present

## 2018-12-26 DIAGNOSIS — R404 Transient alteration of awareness: Secondary | ICD-10-CM | POA: Diagnosis not present

## 2018-12-26 DIAGNOSIS — N132 Hydronephrosis with renal and ureteral calculous obstruction: Secondary | ICD-10-CM | POA: Diagnosis not present

## 2018-12-26 DIAGNOSIS — Z741 Need for assistance with personal care: Secondary | ICD-10-CM | POA: Diagnosis not present

## 2018-12-26 DIAGNOSIS — G9341 Metabolic encephalopathy: Secondary | ICD-10-CM | POA: Diagnosis not present

## 2018-12-26 DIAGNOSIS — N179 Acute kidney failure, unspecified: Secondary | ICD-10-CM | POA: Diagnosis not present

## 2018-12-26 DIAGNOSIS — Z743 Need for continuous supervision: Secondary | ICD-10-CM | POA: Diagnosis not present

## 2018-12-26 DIAGNOSIS — Z8661 Personal history of infections of the central nervous system: Secondary | ICD-10-CM | POA: Diagnosis not present

## 2018-12-26 DIAGNOSIS — N10 Acute tubulo-interstitial nephritis: Secondary | ICD-10-CM | POA: Diagnosis not present

## 2018-12-26 DIAGNOSIS — R279 Unspecified lack of coordination: Secondary | ICD-10-CM | POA: Diagnosis not present

## 2018-12-26 DIAGNOSIS — A419 Sepsis, unspecified organism: Secondary | ICD-10-CM | POA: Diagnosis not present

## 2018-12-26 DIAGNOSIS — R413 Other amnesia: Secondary | ICD-10-CM | POA: Diagnosis not present

## 2018-12-26 DIAGNOSIS — Z936 Other artificial openings of urinary tract status: Secondary | ICD-10-CM | POA: Diagnosis not present

## 2018-12-26 DIAGNOSIS — R2689 Other abnormalities of gait and mobility: Secondary | ICD-10-CM | POA: Diagnosis not present

## 2018-12-26 DIAGNOSIS — R2681 Unsteadiness on feet: Secondary | ICD-10-CM | POA: Diagnosis not present

## 2018-12-26 DIAGNOSIS — E876 Hypokalemia: Secondary | ICD-10-CM | POA: Diagnosis not present

## 2018-12-26 DIAGNOSIS — J9601 Acute respiratory failure with hypoxia: Secondary | ICD-10-CM | POA: Diagnosis not present

## 2018-12-26 DIAGNOSIS — N130 Hydronephrosis with ureteropelvic junction obstruction: Secondary | ICD-10-CM | POA: Diagnosis not present

## 2018-12-26 DIAGNOSIS — R509 Fever, unspecified: Secondary | ICD-10-CM | POA: Diagnosis not present

## 2018-12-26 DIAGNOSIS — E872 Acidosis: Secondary | ICD-10-CM | POA: Diagnosis not present

## 2018-12-26 DIAGNOSIS — I10 Essential (primary) hypertension: Secondary | ICD-10-CM | POA: Diagnosis not present

## 2018-12-26 DIAGNOSIS — R41841 Cognitive communication deficit: Secondary | ICD-10-CM | POA: Diagnosis not present

## 2018-12-26 DIAGNOSIS — K219 Gastro-esophageal reflux disease without esophagitis: Secondary | ICD-10-CM | POA: Diagnosis not present

## 2018-12-26 DIAGNOSIS — F329 Major depressive disorder, single episode, unspecified: Secondary | ICD-10-CM | POA: Diagnosis not present

## 2018-12-26 DIAGNOSIS — R6521 Severe sepsis with septic shock: Secondary | ICD-10-CM | POA: Diagnosis not present

## 2018-12-26 DIAGNOSIS — M6281 Muscle weakness (generalized): Secondary | ICD-10-CM | POA: Diagnosis not present

## 2018-12-26 DIAGNOSIS — R7989 Other specified abnormal findings of blood chemistry: Secondary | ICD-10-CM | POA: Diagnosis not present

## 2018-12-27 MED ORDER — MELATONIN 3 MG PO TABS
3.00 | ORAL_TABLET | ORAL | Status: DC
Start: 2018-12-26 — End: 2018-12-27

## 2018-12-27 MED ORDER — ATENOLOL 25 MG PO TABS
12.50 | ORAL_TABLET | ORAL | Status: DC
Start: 2018-12-27 — End: 2018-12-27

## 2018-12-27 MED ORDER — TRAZODONE HCL 50 MG PO TABS
50.00 | ORAL_TABLET | ORAL | Status: DC
Start: ? — End: 2018-12-27

## 2018-12-27 MED ORDER — HYDRALAZINE HCL 20 MG/ML IJ SOLN
10.00 | INTRAMUSCULAR | Status: DC
Start: ? — End: 2018-12-27

## 2018-12-27 MED ORDER — HYDROXYZINE HCL 25 MG PO TABS
25.00 | ORAL_TABLET | ORAL | Status: DC
Start: ? — End: 2018-12-27

## 2018-12-27 MED ORDER — DIPHENHYDRAMINE HCL 25 MG PO CAPS
25.00 | ORAL_CAPSULE | ORAL | Status: DC
Start: ? — End: 2018-12-27

## 2019-01-01 ENCOUNTER — Ambulatory Visit: Payer: Medicare Other | Admitting: Neurology

## 2019-01-01 DIAGNOSIS — D509 Iron deficiency anemia, unspecified: Secondary | ICD-10-CM | POA: Diagnosis not present

## 2019-01-01 DIAGNOSIS — N179 Acute kidney failure, unspecified: Secondary | ICD-10-CM | POA: Diagnosis not present

## 2019-01-01 DIAGNOSIS — G9341 Metabolic encephalopathy: Secondary | ICD-10-CM | POA: Diagnosis not present

## 2019-01-01 DIAGNOSIS — I10 Essential (primary) hypertension: Secondary | ICD-10-CM | POA: Diagnosis not present

## 2019-01-16 ENCOUNTER — Telehealth: Payer: Self-pay

## 2019-01-16 DIAGNOSIS — I10 Essential (primary) hypertension: Secondary | ICD-10-CM | POA: Diagnosis not present

## 2019-01-16 DIAGNOSIS — L97812 Non-pressure chronic ulcer of other part of right lower leg with fat layer exposed: Secondary | ICD-10-CM | POA: Diagnosis not present

## 2019-01-16 DIAGNOSIS — I872 Venous insufficiency (chronic) (peripheral): Secondary | ICD-10-CM | POA: Diagnosis not present

## 2019-01-16 DIAGNOSIS — L97822 Non-pressure chronic ulcer of other part of left lower leg with fat layer exposed: Secondary | ICD-10-CM | POA: Diagnosis not present

## 2019-01-16 DIAGNOSIS — I739 Peripheral vascular disease, unspecified: Secondary | ICD-10-CM | POA: Diagnosis not present

## 2019-01-16 DIAGNOSIS — B965 Pseudomonas (aeruginosa) (mallei) (pseudomallei) as the cause of diseases classified elsewhere: Secondary | ICD-10-CM | POA: Diagnosis not present

## 2019-01-16 MED ORDER — ESCITALOPRAM OXALATE 20 MG PO TABS: 20.0000 mg | ORAL_TABLET | Freq: Every day | ORAL | 0 refills | Status: DC

## 2019-01-16 NOTE — Telephone Encounter (Signed)
Patient was discharged a few days ago from the hospital. A med rec was done with daughter, patient needed a refill on LEXAPRO and was sent to her pharmacy. Discontinued MAXIDE and potassium. We are working on an appointment for urology.

## 2019-01-19 DIAGNOSIS — H40013 Open angle with borderline findings, low risk, bilateral: Secondary | ICD-10-CM | POA: Diagnosis not present

## 2019-01-19 DIAGNOSIS — H25813 Combined forms of age-related cataract, bilateral: Secondary | ICD-10-CM | POA: Diagnosis not present

## 2019-01-19 DIAGNOSIS — H5203 Hypermetropia, bilateral: Secondary | ICD-10-CM | POA: Diagnosis not present

## 2019-01-19 DIAGNOSIS — H524 Presbyopia: Secondary | ICD-10-CM | POA: Diagnosis not present

## 2019-01-19 DIAGNOSIS — H11041 Peripheral pterygium, stationary, right eye: Secondary | ICD-10-CM | POA: Diagnosis not present

## 2019-01-19 DIAGNOSIS — H52223 Regular astigmatism, bilateral: Secondary | ICD-10-CM | POA: Diagnosis not present

## 2019-01-20 ENCOUNTER — Ambulatory Visit (INDEPENDENT_AMBULATORY_CARE_PROVIDER_SITE_OTHER): Payer: Medicare Other | Admitting: Internal Medicine

## 2019-01-20 ENCOUNTER — Encounter: Payer: Self-pay | Admitting: Internal Medicine

## 2019-01-20 ENCOUNTER — Telehealth: Payer: Self-pay | Admitting: Internal Medicine

## 2019-01-20 VITALS — BP 102/60 | HR 74 | Temp 98.3°F | Ht 66.5 in | Wt 143.0 lb

## 2019-01-20 DIAGNOSIS — A419 Sepsis, unspecified organism: Secondary | ICD-10-CM | POA: Diagnosis not present

## 2019-01-20 DIAGNOSIS — N135 Crossing vessel and stricture of ureter without hydronephrosis: Secondary | ICD-10-CM

## 2019-01-20 DIAGNOSIS — E782 Mixed hyperlipidemia: Secondary | ICD-10-CM | POA: Diagnosis not present

## 2019-01-20 DIAGNOSIS — R8290 Unspecified abnormal findings in urine: Secondary | ICD-10-CM | POA: Diagnosis not present

## 2019-01-20 DIAGNOSIS — I739 Peripheral vascular disease, unspecified: Secondary | ICD-10-CM | POA: Diagnosis not present

## 2019-01-20 DIAGNOSIS — N390 Urinary tract infection, site not specified: Secondary | ICD-10-CM

## 2019-01-20 DIAGNOSIS — M810 Age-related osteoporosis without current pathological fracture: Secondary | ICD-10-CM | POA: Diagnosis not present

## 2019-01-20 DIAGNOSIS — L97919 Non-pressure chronic ulcer of unspecified part of right lower leg with unspecified severity: Secondary | ICD-10-CM | POA: Diagnosis not present

## 2019-01-20 DIAGNOSIS — L97812 Non-pressure chronic ulcer of other part of right lower leg with fat layer exposed: Secondary | ICD-10-CM | POA: Diagnosis not present

## 2019-01-20 DIAGNOSIS — R7302 Impaired glucose tolerance (oral): Secondary | ICD-10-CM

## 2019-01-20 DIAGNOSIS — L97822 Non-pressure chronic ulcer of other part of left lower leg with fat layer exposed: Secondary | ICD-10-CM | POA: Diagnosis not present

## 2019-01-20 DIAGNOSIS — R6521 Severe sepsis with septic shock: Secondary | ICD-10-CM | POA: Diagnosis not present

## 2019-01-20 DIAGNOSIS — I10 Essential (primary) hypertension: Secondary | ICD-10-CM

## 2019-01-20 DIAGNOSIS — B965 Pseudomonas (aeruginosa) (mallei) (pseudomallei) as the cause of diseases classified elsewhere: Secondary | ICD-10-CM | POA: Diagnosis not present

## 2019-01-20 DIAGNOSIS — L97929 Non-pressure chronic ulcer of unspecified part of left lower leg with unspecified severity: Secondary | ICD-10-CM | POA: Diagnosis not present

## 2019-01-20 DIAGNOSIS — I872 Venous insufficiency (chronic) (peripheral): Secondary | ICD-10-CM | POA: Diagnosis not present

## 2019-01-20 DIAGNOSIS — I493 Ventricular premature depolarization: Secondary | ICD-10-CM

## 2019-01-20 LAB — POCT URINALYSIS DIPSTICK
Bilirubin, UA: NEGATIVE
Blood, UA: NEGATIVE
Glucose, UA: NEGATIVE
Ketones, UA: NEGATIVE
Nitrite, UA: NEGATIVE
Protein, UA: POSITIVE — AB
SPEC GRAV UA: 1.01 (ref 1.010–1.025)
Urobilinogen, UA: 0.2 E.U./dL
pH, UA: 6 (ref 5.0–8.0)

## 2019-01-20 NOTE — Patient Instructions (Signed)
Labs drawn and pending.  Urine culture pending.  Return in 4 weeks.  Referral to [facility] urology

## 2019-01-20 NOTE — Telephone Encounter (Signed)
Donna Woods with physical thearpy called to leave a message for nurse. Patient will have Physical therapy twice a week for two weeks and then 1 time a week for 6 weeks

## 2019-01-20 NOTE — Progress Notes (Signed)
Subjective:    Patient ID: Donna Woods, female    DOB: Oct 29, 1934, 85 y.o.   MRN: [id number]  HPI 85 year old Female from [place] for hospitalization follow up.Hospitalized at [facility] in [place] Jan 23-24  with septic shock and was transferred to ICU at [facility] in [place]. Stopped Maxzide. No recent labs. Was on potassium supplement and is still taking it although not on diuretic. Has known Right sided congenital UPJ obstruction. Not seen regularly by Urologist. Never had issues with sepsis from this obstruction. Was at beauty salon and had acute syncope. Was hypotensive with BP 70. She has longstanding memory issues. Resides alone with husband. Daughters live out of town but are supportive. Required Levophed for hypotension . Hx meningitis and encephalitis 2010 treated at [facility] with resultant memory issues that have gradually gotten worse. Had leukocytosis 39,000. Given IV Vancomycin, Ceftriaxone and acyclovir IV. A right IJ central venous catheter was placed.MRI of brain showed no acte CVA or bleed. Had bilateral interstitial infiltrates on CXR. procalcitonin was greater than 100. Was at [place] Jan 24- Jan 29th  A right percutaneous nephrostomy tube was placed emergently at [facility] on Jan24 due to severe right hydronephrosis. It is still in place.Attempt made to get clean catch urine specimen today but dipstick urine specimen abnormal and culture was sent. Currently not on antibiotics. Needs follow up regarding nephrostomy tube.  Now at home after short stay in a nursing center.  Was admitted to [facility] [place] January 31 through the February 19 home PT has been ordered and has help 4 hours in am and 4 hours in afternnoon and early evening. She and husband have considered moving to [place].  Prior to this was being seen at [facility] for chronic leg ulcers and cellultis present for some 8 months slow  to heal. These are being wrapped presently and according to daughter who is an Therapist, sports and accompanies her today are stable and slowly improving. Had evaluation by Dr. Gwenlyn Found and thought not to be vascular related ulcers.ABIs August 2019 were WNL.  History of asymptomatic bradycardia, essential hypertension and PVCs.  History of hyperlipidemia.  Had stress test 2010 showing no ischemia.  PVCs are controllable with beta-blocker.  History of GE reflux, osteoporosis, fibromyalgia type pain, history of vitamin D deficiency.  Left inguinal hernia repair in [facility] November 2013.  Had knee arthroplasty 2005.  Total hysterectomy approximately 1968.  Cholecystectomy 1969.  Family history: Father died at age 80 of respiratory failure.  Mother died at 10 of neurological disease.  One brother with history of neurological disorder (CMT disease).  Another brother in good health.  Sister in good health.  Daughter with hypertension and glucose intolerance.  Another daughter in good health.  Daughter who is an Therapist, sports and works at [employer] in [place] emergency department is concerned about nephrostomy tube getting displaced.   Review of Systems see above no complaint of pain     Objective:   Physical Exam  Weight 143 pounds.  Blood pressure 102/60.  Pulse 74.  Temperature 98.3 degrees orally.  Pulse oximetry 93%.  Skin warm and dry.  She is alert and talkative.  Is pleasant.  Neck is supple.  No adenopathy.  Chest clear to auscultation without rales or wheezing.  Cardiac exam regular rate and rhythm normal S1 and S2.  Legs are wrapped with Ace bandages and were not unwrapped.  She ambulates with  some assistance.  Has nephrostomy tube.      Assessment & Plan:  History of congenital right UPJ obstruction requiring nephrostomy tube due to urosepsis and right hydronephrosis recently.  Was critically ill with urosepsis and was hospitalized at [facility] in [place] and subsequently [place].   Subsequently transferred to rehab center and discharged home on February 19.  There is help in the home approximately 8 hours daily.  She and her husband are located moving to [place].  They would like follow-up here with the [facility].  Plan.  Have attempted to obtain urine specimen today.  Dipstick UA was abnormal.  Culture was sent.  I have discontinued her potassium supplement as her diuretic is been discontinued.  Labs drawn and pending.  She is to return here in 4 weeks.

## 2019-01-21 DIAGNOSIS — F0390 Unspecified dementia without behavioral disturbance: Secondary | ICD-10-CM | POA: Diagnosis not present

## 2019-01-21 DIAGNOSIS — K219 Gastro-esophageal reflux disease without esophagitis: Secondary | ICD-10-CM | POA: Diagnosis not present

## 2019-01-21 DIAGNOSIS — L97822 Non-pressure chronic ulcer of other part of left lower leg with fat layer exposed: Secondary | ICD-10-CM | POA: Diagnosis not present

## 2019-01-21 DIAGNOSIS — Z87440 Personal history of urinary (tract) infections: Secondary | ICD-10-CM | POA: Diagnosis not present

## 2019-01-21 DIAGNOSIS — I4891 Unspecified atrial fibrillation: Secondary | ICD-10-CM | POA: Diagnosis not present

## 2019-01-21 DIAGNOSIS — R001 Bradycardia, unspecified: Secondary | ICD-10-CM | POA: Diagnosis not present

## 2019-01-21 DIAGNOSIS — I739 Peripheral vascular disease, unspecified: Secondary | ICD-10-CM | POA: Diagnosis not present

## 2019-01-21 DIAGNOSIS — D72825 Bandemia: Secondary | ICD-10-CM | POA: Diagnosis not present

## 2019-01-21 DIAGNOSIS — I872 Venous insufficiency (chronic) (peripheral): Secondary | ICD-10-CM | POA: Diagnosis not present

## 2019-01-21 DIAGNOSIS — M810 Age-related osteoporosis without current pathological fracture: Secondary | ICD-10-CM | POA: Diagnosis not present

## 2019-01-21 DIAGNOSIS — N179 Acute kidney failure, unspecified: Secondary | ICD-10-CM | POA: Diagnosis not present

## 2019-01-21 DIAGNOSIS — Z9181 History of falling: Secondary | ICD-10-CM | POA: Diagnosis not present

## 2019-01-21 DIAGNOSIS — Q6211 Congenital occlusion of ureteropelvic junction: Secondary | ICD-10-CM | POA: Diagnosis not present

## 2019-01-21 DIAGNOSIS — D509 Iron deficiency anemia, unspecified: Secondary | ICD-10-CM | POA: Diagnosis not present

## 2019-01-21 DIAGNOSIS — Z436 Encounter for attention to other artificial openings of urinary tract: Secondary | ICD-10-CM | POA: Diagnosis not present

## 2019-01-21 DIAGNOSIS — Z96652 Presence of left artificial knee joint: Secondary | ICD-10-CM | POA: Diagnosis not present

## 2019-01-21 DIAGNOSIS — I119 Hypertensive heart disease without heart failure: Secondary | ICD-10-CM | POA: Diagnosis not present

## 2019-01-21 DIAGNOSIS — N281 Cyst of kidney, acquired: Secondary | ICD-10-CM | POA: Diagnosis not present

## 2019-01-21 DIAGNOSIS — L97812 Non-pressure chronic ulcer of other part of right lower leg with fat layer exposed: Secondary | ICD-10-CM | POA: Diagnosis not present

## 2019-01-21 DIAGNOSIS — E876 Hypokalemia: Secondary | ICD-10-CM | POA: Diagnosis not present

## 2019-01-21 DIAGNOSIS — K4090 Unilateral inguinal hernia, without obstruction or gangrene, not specified as recurrent: Secondary | ICD-10-CM | POA: Diagnosis not present

## 2019-01-22 ENCOUNTER — Other Ambulatory Visit: Payer: Self-pay

## 2019-01-22 LAB — CBC WITH DIFFERENTIAL/PLATELET
ABSOLUTE MONOCYTES: 598 {cells}/uL (ref 200–950)
BASOS PCT: 1.2 %
Basophils Absolute: 82 cells/uL (ref 0–200)
Eosinophils Absolute: 299 cells/uL (ref 15–500)
Eosinophils Relative: 4.4 %
HCT: 33.1 % — ABNORMAL LOW (ref 35.0–45.0)
Hemoglobin: 10.8 g/dL — ABNORMAL LOW (ref 11.7–15.5)
Lymphs Abs: 1469 cells/uL (ref 850–3900)
MCH: 31.9 pg (ref 27.0–33.0)
MCHC: 32.6 g/dL (ref 32.0–36.0)
MCV: 97.6 fL (ref 80.0–100.0)
MPV: 12 fL (ref 7.5–12.5)
Monocytes Relative: 8.8 %
Neutro Abs: 4352 cells/uL (ref 1500–7800)
Neutrophils Relative %: 64 %
Platelets: 209 10*3/uL (ref 140–400)
RBC: 3.39 10*6/uL — ABNORMAL LOW (ref 3.80–5.10)
RDW: 12.2 % (ref 11.0–15.0)
Total Lymphocyte: 21.6 %
WBC: 6.8 10*3/uL (ref 3.8–10.8)

## 2019-01-22 LAB — COMPLETE METABOLIC PANEL WITH GFR
AG Ratio: 1.8 (calc) (ref 1.0–2.5)
ALT: 6 U/L (ref 6–29)
AST: 8 U/L — ABNORMAL LOW (ref 10–35)
Albumin: 4.2 g/dL (ref 3.6–5.1)
Alkaline phosphatase (APISO): 46 U/L (ref 37–153)
BUN / CREAT RATIO: 24 (calc) — AB (ref 6–22)
BUN: 31 mg/dL — ABNORMAL HIGH (ref 7–25)
CO2: 25 mmol/L (ref 20–32)
Calcium: 9.5 mg/dL (ref 8.6–10.4)
Chloride: 109 mmol/L (ref 98–110)
Creat: 1.28 mg/dL — ABNORMAL HIGH (ref 0.60–0.88)
GFR, Est African American: 45 mL/min/{1.73_m2} — ABNORMAL LOW (ref >=60)
GFR, Est Non African American: 39 mL/min/{1.73_m2} — ABNORMAL LOW (ref >=60)
Globulin: 2.4 g/dL (calc) (ref 1.9–3.7)
Glucose, Bld: 92 mg/dL (ref 65–99)
Potassium: 3.9 mmol/L (ref 3.5–5.3)
Sodium: 143 mmol/L (ref 135–146)
Total Bilirubin: 0.4 mg/dL (ref 0.2–1.2)
Total Protein: 6.6 g/dL (ref 6.1–8.1)

## 2019-01-22 LAB — TSH: TSH: 2.61 m[IU]/L (ref 0.40–4.50)

## 2019-01-22 LAB — PREALBUMIN: Prealbumin: 21 mg/dL (ref 17–34)

## 2019-01-22 MED ORDER — NITROFURANTOIN MONOHYD MACRO 100 MG PO CAPS: 100.0000 mg | ORAL_CAPSULE | Freq: Two times a day (BID) | ORAL | 0 refills | Status: DC

## 2019-01-23 DIAGNOSIS — N390 Urinary tract infection, site not specified: Secondary | ICD-10-CM

## 2019-01-23 DIAGNOSIS — I872 Venous insufficiency (chronic) (peripheral): Secondary | ICD-10-CM | POA: Diagnosis not present

## 2019-01-23 DIAGNOSIS — Q6211 Congenital occlusion of ureteropelvic junction: Secondary | ICD-10-CM | POA: Diagnosis not present

## 2019-01-23 DIAGNOSIS — N179 Acute kidney failure, unspecified: Secondary | ICD-10-CM | POA: Diagnosis not present

## 2019-01-23 DIAGNOSIS — L97822 Non-pressure chronic ulcer of other part of left lower leg with fat layer exposed: Secondary | ICD-10-CM | POA: Diagnosis not present

## 2019-01-23 DIAGNOSIS — L97812 Non-pressure chronic ulcer of other part of right lower leg with fat layer exposed: Secondary | ICD-10-CM | POA: Diagnosis not present

## 2019-01-23 DIAGNOSIS — Z436 Encounter for attention to other artificial openings of urinary tract: Secondary | ICD-10-CM | POA: Diagnosis not present

## 2019-01-23 LAB — URINE CULTURE
MICRO NUMBER: 239444
SPECIMEN QUALITY: ADEQUATE

## 2019-01-24 DIAGNOSIS — A419 Sepsis, unspecified organism: Secondary | ICD-10-CM

## 2019-01-24 DIAGNOSIS — R413 Other amnesia: Secondary | ICD-10-CM

## 2019-01-27 DIAGNOSIS — L97822 Non-pressure chronic ulcer of other part of left lower leg with fat layer exposed: Secondary | ICD-10-CM | POA: Diagnosis not present

## 2019-01-27 DIAGNOSIS — I872 Venous insufficiency (chronic) (peripheral): Secondary | ICD-10-CM | POA: Diagnosis not present

## 2019-01-27 DIAGNOSIS — L97812 Non-pressure chronic ulcer of other part of right lower leg with fat layer exposed: Secondary | ICD-10-CM | POA: Diagnosis not present

## 2019-01-27 DIAGNOSIS — Z436 Encounter for attention to other artificial openings of urinary tract: Secondary | ICD-10-CM | POA: Diagnosis not present

## 2019-01-27 DIAGNOSIS — N179 Acute kidney failure, unspecified: Secondary | ICD-10-CM | POA: Diagnosis not present

## 2019-01-27 DIAGNOSIS — Q6211 Congenital occlusion of ureteropelvic junction: Secondary | ICD-10-CM | POA: Diagnosis not present

## 2019-01-28 DIAGNOSIS — N179 Acute kidney failure, unspecified: Secondary | ICD-10-CM | POA: Diagnosis not present

## 2019-01-28 DIAGNOSIS — Z436 Encounter for attention to other artificial openings of urinary tract: Secondary | ICD-10-CM | POA: Diagnosis not present

## 2019-01-28 DIAGNOSIS — I872 Venous insufficiency (chronic) (peripheral): Secondary | ICD-10-CM | POA: Diagnosis not present

## 2019-01-28 DIAGNOSIS — L97822 Non-pressure chronic ulcer of other part of left lower leg with fat layer exposed: Secondary | ICD-10-CM | POA: Diagnosis not present

## 2019-01-28 DIAGNOSIS — Q6211 Congenital occlusion of ureteropelvic junction: Secondary | ICD-10-CM | POA: Diagnosis not present

## 2019-01-28 DIAGNOSIS — L97812 Non-pressure chronic ulcer of other part of right lower leg with fat layer exposed: Secondary | ICD-10-CM | POA: Diagnosis not present

## 2019-01-29 DIAGNOSIS — Z436 Encounter for attention to other artificial openings of urinary tract: Secondary | ICD-10-CM | POA: Diagnosis not present

## 2019-01-29 DIAGNOSIS — L97812 Non-pressure chronic ulcer of other part of right lower leg with fat layer exposed: Secondary | ICD-10-CM | POA: Diagnosis not present

## 2019-01-29 DIAGNOSIS — Q6211 Congenital occlusion of ureteropelvic junction: Secondary | ICD-10-CM | POA: Diagnosis not present

## 2019-01-29 DIAGNOSIS — I872 Venous insufficiency (chronic) (peripheral): Secondary | ICD-10-CM | POA: Diagnosis not present

## 2019-01-29 DIAGNOSIS — L97822 Non-pressure chronic ulcer of other part of left lower leg with fat layer exposed: Secondary | ICD-10-CM | POA: Diagnosis not present

## 2019-01-29 DIAGNOSIS — N179 Acute kidney failure, unspecified: Secondary | ICD-10-CM | POA: Diagnosis not present

## 2019-01-30 ENCOUNTER — Other Ambulatory Visit (HOSPITAL_COMMUNITY): Payer: Self-pay | Admitting: Urology

## 2019-01-30 ENCOUNTER — Other Ambulatory Visit: Payer: Self-pay | Admitting: Urology

## 2019-01-30 ENCOUNTER — Encounter (HOSPITAL_BASED_OUTPATIENT_CLINIC_OR_DEPARTMENT_OTHER): Payer: Medicare Other | Attending: Internal Medicine

## 2019-01-30 DIAGNOSIS — I10 Essential (primary) hypertension: Secondary | ICD-10-CM | POA: Insufficient documentation

## 2019-01-30 DIAGNOSIS — I872 Venous insufficiency (chronic) (peripheral): Secondary | ICD-10-CM | POA: Diagnosis not present

## 2019-01-30 DIAGNOSIS — F0390 Unspecified dementia without behavioral disturbance: Secondary | ICD-10-CM | POA: Diagnosis not present

## 2019-01-30 DIAGNOSIS — N130 Hydronephrosis with ureteropelvic junction obstruction: Secondary | ICD-10-CM | POA: Diagnosis not present

## 2019-01-30 DIAGNOSIS — L97822 Non-pressure chronic ulcer of other part of left lower leg with fat layer exposed: Secondary | ICD-10-CM | POA: Insufficient documentation

## 2019-01-30 DIAGNOSIS — L97212 Non-pressure chronic ulcer of right calf with fat layer exposed: Secondary | ICD-10-CM | POA: Diagnosis not present

## 2019-01-30 DIAGNOSIS — L97812 Non-pressure chronic ulcer of other part of right lower leg with fat layer exposed: Secondary | ICD-10-CM | POA: Diagnosis not present

## 2019-01-30 DIAGNOSIS — N390 Urinary tract infection, site not specified: Secondary | ICD-10-CM | POA: Diagnosis not present

## 2019-01-30 DIAGNOSIS — N135 Crossing vessel and stricture of ureter without hydronephrosis: Secondary | ICD-10-CM | POA: Diagnosis not present

## 2019-02-02 DIAGNOSIS — Z436 Encounter for attention to other artificial openings of urinary tract: Secondary | ICD-10-CM | POA: Diagnosis not present

## 2019-02-02 DIAGNOSIS — Q6211 Congenital occlusion of ureteropelvic junction: Secondary | ICD-10-CM | POA: Diagnosis not present

## 2019-02-02 DIAGNOSIS — I872 Venous insufficiency (chronic) (peripheral): Secondary | ICD-10-CM | POA: Diagnosis not present

## 2019-02-02 DIAGNOSIS — L97822 Non-pressure chronic ulcer of other part of left lower leg with fat layer exposed: Secondary | ICD-10-CM | POA: Diagnosis not present

## 2019-02-02 DIAGNOSIS — N179 Acute kidney failure, unspecified: Secondary | ICD-10-CM | POA: Diagnosis not present

## 2019-02-02 DIAGNOSIS — L97812 Non-pressure chronic ulcer of other part of right lower leg with fat layer exposed: Secondary | ICD-10-CM | POA: Diagnosis not present

## 2019-02-04 ENCOUNTER — Encounter (HOSPITAL_COMMUNITY): Payer: Self-pay

## 2019-02-04 ENCOUNTER — Ambulatory Visit (HOSPITAL_COMMUNITY)
Admission: RE | Admit: 2019-02-04 | Discharge: 2019-02-04 | Disposition: A | Discharge status: Home / Self Care | Payer: Medicare Other | Source: Ambulatory Visit | Attending: Urology | Admitting: Urology

## 2019-02-04 ENCOUNTER — Other Ambulatory Visit: Payer: Self-pay

## 2019-02-04 DIAGNOSIS — Q6211 Congenital occlusion of ureteropelvic junction: Secondary | ICD-10-CM | POA: Diagnosis not present

## 2019-02-04 DIAGNOSIS — N130 Hydronephrosis with ureteropelvic junction obstruction: Secondary | ICD-10-CM | POA: Diagnosis not present

## 2019-02-04 DIAGNOSIS — N132 Hydronephrosis with renal and ureteral calculous obstruction: Secondary | ICD-10-CM | POA: Diagnosis not present

## 2019-02-04 DIAGNOSIS — L97812 Non-pressure chronic ulcer of other part of right lower leg with fat layer exposed: Secondary | ICD-10-CM | POA: Diagnosis not present

## 2019-02-04 DIAGNOSIS — L97822 Non-pressure chronic ulcer of other part of left lower leg with fat layer exposed: Secondary | ICD-10-CM | POA: Diagnosis not present

## 2019-02-04 DIAGNOSIS — I872 Venous insufficiency (chronic) (peripheral): Secondary | ICD-10-CM | POA: Diagnosis not present

## 2019-02-04 DIAGNOSIS — Z436 Encounter for attention to other artificial openings of urinary tract: Secondary | ICD-10-CM | POA: Diagnosis not present

## 2019-02-04 DIAGNOSIS — N179 Acute kidney failure, unspecified: Secondary | ICD-10-CM | POA: Diagnosis not present

## 2019-02-04 MED ORDER — FUROSEMIDE 10 MG/ML IJ SOLN
INTRAMUSCULAR | Status: AC
  Filled 2019-02-04: qty 4

## 2019-02-04 MED ORDER — TECHNETIUM TC 99M MERTIATIDE
5.0800 | Freq: Once | INTRAVENOUS | Status: AC | PRN
  Administered 2019-02-04: 5.08 via INTRAVENOUS

## 2019-02-04 MED ORDER — FUROSEMIDE 10 MG/ML IJ SOLN: 33.0000 mg | Freq: Once | INTRAMUSCULAR | Status: DC

## 2019-02-05 DIAGNOSIS — Z436 Encounter for attention to other artificial openings of urinary tract: Secondary | ICD-10-CM | POA: Diagnosis not present

## 2019-02-05 DIAGNOSIS — N179 Acute kidney failure, unspecified: Secondary | ICD-10-CM | POA: Diagnosis not present

## 2019-02-05 DIAGNOSIS — L97812 Non-pressure chronic ulcer of other part of right lower leg with fat layer exposed: Secondary | ICD-10-CM | POA: Diagnosis not present

## 2019-02-05 DIAGNOSIS — Q6211 Congenital occlusion of ureteropelvic junction: Secondary | ICD-10-CM | POA: Diagnosis not present

## 2019-02-05 DIAGNOSIS — I872 Venous insufficiency (chronic) (peripheral): Secondary | ICD-10-CM | POA: Diagnosis not present

## 2019-02-05 DIAGNOSIS — L97822 Non-pressure chronic ulcer of other part of left lower leg with fat layer exposed: Secondary | ICD-10-CM | POA: Diagnosis not present

## 2019-02-06 DIAGNOSIS — N2889 Other specified disorders of kidney and ureter: Secondary | ICD-10-CM | POA: Diagnosis not present

## 2019-02-06 DIAGNOSIS — L97822 Non-pressure chronic ulcer of other part of left lower leg with fat layer exposed: Secondary | ICD-10-CM | POA: Diagnosis not present

## 2019-02-06 DIAGNOSIS — N1330 Unspecified hydronephrosis: Secondary | ICD-10-CM | POA: Diagnosis not present

## 2019-02-06 DIAGNOSIS — L97812 Non-pressure chronic ulcer of other part of right lower leg with fat layer exposed: Secondary | ICD-10-CM | POA: Diagnosis not present

## 2019-02-06 DIAGNOSIS — N179 Acute kidney failure, unspecified: Secondary | ICD-10-CM | POA: Diagnosis not present

## 2019-02-06 DIAGNOSIS — Z436 Encounter for attention to other artificial openings of urinary tract: Secondary | ICD-10-CM | POA: Diagnosis not present

## 2019-02-06 DIAGNOSIS — Z87448 Personal history of other diseases of urinary system: Secondary | ICD-10-CM | POA: Diagnosis not present

## 2019-02-06 DIAGNOSIS — I872 Venous insufficiency (chronic) (peripheral): Secondary | ICD-10-CM | POA: Diagnosis not present

## 2019-02-06 DIAGNOSIS — Q6211 Congenital occlusion of ureteropelvic junction: Secondary | ICD-10-CM | POA: Diagnosis not present

## 2019-02-09 DIAGNOSIS — Q6211 Congenital occlusion of ureteropelvic junction: Secondary | ICD-10-CM | POA: Diagnosis not present

## 2019-02-09 DIAGNOSIS — L97812 Non-pressure chronic ulcer of other part of right lower leg with fat layer exposed: Secondary | ICD-10-CM | POA: Diagnosis not present

## 2019-02-09 DIAGNOSIS — Z436 Encounter for attention to other artificial openings of urinary tract: Secondary | ICD-10-CM | POA: Diagnosis not present

## 2019-02-09 DIAGNOSIS — I872 Venous insufficiency (chronic) (peripheral): Secondary | ICD-10-CM | POA: Diagnosis not present

## 2019-02-09 DIAGNOSIS — L97822 Non-pressure chronic ulcer of other part of left lower leg with fat layer exposed: Secondary | ICD-10-CM | POA: Diagnosis not present

## 2019-02-09 DIAGNOSIS — N179 Acute kidney failure, unspecified: Secondary | ICD-10-CM | POA: Diagnosis not present

## 2019-02-11 DIAGNOSIS — Q6211 Congenital occlusion of ureteropelvic junction: Secondary | ICD-10-CM | POA: Diagnosis not present

## 2019-02-11 DIAGNOSIS — N179 Acute kidney failure, unspecified: Secondary | ICD-10-CM | POA: Diagnosis not present

## 2019-02-11 DIAGNOSIS — Z436 Encounter for attention to other artificial openings of urinary tract: Secondary | ICD-10-CM | POA: Diagnosis not present

## 2019-02-11 DIAGNOSIS — L97822 Non-pressure chronic ulcer of other part of left lower leg with fat layer exposed: Secondary | ICD-10-CM | POA: Diagnosis not present

## 2019-02-11 DIAGNOSIS — L97812 Non-pressure chronic ulcer of other part of right lower leg with fat layer exposed: Secondary | ICD-10-CM | POA: Diagnosis not present

## 2019-02-11 DIAGNOSIS — I872 Venous insufficiency (chronic) (peripheral): Secondary | ICD-10-CM | POA: Diagnosis not present

## 2019-02-12 DIAGNOSIS — N130 Hydronephrosis with ureteropelvic junction obstruction: Secondary | ICD-10-CM | POA: Diagnosis not present

## 2019-02-12 DIAGNOSIS — N135 Crossing vessel and stricture of ureter without hydronephrosis: Secondary | ICD-10-CM | POA: Diagnosis not present

## 2019-02-12 DIAGNOSIS — N390 Urinary tract infection, site not specified: Secondary | ICD-10-CM | POA: Diagnosis not present

## 2019-02-13 DIAGNOSIS — Q6211 Congenital occlusion of ureteropelvic junction: Secondary | ICD-10-CM | POA: Diagnosis not present

## 2019-02-13 DIAGNOSIS — I872 Venous insufficiency (chronic) (peripheral): Secondary | ICD-10-CM | POA: Diagnosis not present

## 2019-02-13 DIAGNOSIS — Z436 Encounter for attention to other artificial openings of urinary tract: Secondary | ICD-10-CM | POA: Diagnosis not present

## 2019-02-13 DIAGNOSIS — L97812 Non-pressure chronic ulcer of other part of right lower leg with fat layer exposed: Secondary | ICD-10-CM | POA: Diagnosis not present

## 2019-02-13 DIAGNOSIS — N179 Acute kidney failure, unspecified: Secondary | ICD-10-CM | POA: Diagnosis not present

## 2019-02-13 DIAGNOSIS — L97822 Non-pressure chronic ulcer of other part of left lower leg with fat layer exposed: Secondary | ICD-10-CM | POA: Diagnosis not present

## 2019-02-16 DIAGNOSIS — L97822 Non-pressure chronic ulcer of other part of left lower leg with fat layer exposed: Secondary | ICD-10-CM | POA: Diagnosis not present

## 2019-02-16 DIAGNOSIS — N179 Acute kidney failure, unspecified: Secondary | ICD-10-CM | POA: Diagnosis not present

## 2019-02-16 DIAGNOSIS — Z436 Encounter for attention to other artificial openings of urinary tract: Secondary | ICD-10-CM | POA: Diagnosis not present

## 2019-02-16 DIAGNOSIS — Q6211 Congenital occlusion of ureteropelvic junction: Secondary | ICD-10-CM | POA: Diagnosis not present

## 2019-02-16 DIAGNOSIS — I872 Venous insufficiency (chronic) (peripheral): Secondary | ICD-10-CM | POA: Diagnosis not present

## 2019-02-16 DIAGNOSIS — L97812 Non-pressure chronic ulcer of other part of right lower leg with fat layer exposed: Secondary | ICD-10-CM | POA: Diagnosis not present

## 2019-02-17 ENCOUNTER — Ambulatory Visit: Payer: Medicare Other | Admitting: Internal Medicine

## 2019-02-18 DIAGNOSIS — I10 Essential (primary) hypertension: Secondary | ICD-10-CM | POA: Diagnosis not present

## 2019-02-18 DIAGNOSIS — F0390 Unspecified dementia without behavioral disturbance: Secondary | ICD-10-CM | POA: Diagnosis not present

## 2019-02-18 DIAGNOSIS — L97812 Non-pressure chronic ulcer of other part of right lower leg with fat layer exposed: Secondary | ICD-10-CM | POA: Diagnosis not present

## 2019-02-18 DIAGNOSIS — I872 Venous insufficiency (chronic) (peripheral): Secondary | ICD-10-CM | POA: Diagnosis not present

## 2019-02-18 DIAGNOSIS — L97822 Non-pressure chronic ulcer of other part of left lower leg with fat layer exposed: Secondary | ICD-10-CM | POA: Diagnosis not present

## 2019-02-20 DIAGNOSIS — I739 Peripheral vascular disease, unspecified: Secondary | ICD-10-CM | POA: Diagnosis not present

## 2019-02-20 DIAGNOSIS — M810 Age-related osteoporosis without current pathological fracture: Secondary | ICD-10-CM | POA: Diagnosis not present

## 2019-02-20 DIAGNOSIS — D72825 Bandemia: Secondary | ICD-10-CM | POA: Diagnosis not present

## 2019-02-20 DIAGNOSIS — Z9181 History of falling: Secondary | ICD-10-CM | POA: Diagnosis not present

## 2019-02-20 DIAGNOSIS — Q6211 Congenital occlusion of ureteropelvic junction: Secondary | ICD-10-CM | POA: Diagnosis not present

## 2019-02-20 DIAGNOSIS — D509 Iron deficiency anemia, unspecified: Secondary | ICD-10-CM | POA: Diagnosis not present

## 2019-02-20 DIAGNOSIS — E876 Hypokalemia: Secondary | ICD-10-CM | POA: Diagnosis not present

## 2019-02-20 DIAGNOSIS — Z96652 Presence of left artificial knee joint: Secondary | ICD-10-CM | POA: Diagnosis not present

## 2019-02-20 DIAGNOSIS — I872 Venous insufficiency (chronic) (peripheral): Secondary | ICD-10-CM | POA: Diagnosis not present

## 2019-02-20 DIAGNOSIS — K4090 Unilateral inguinal hernia, without obstruction or gangrene, not specified as recurrent: Secondary | ICD-10-CM | POA: Diagnosis not present

## 2019-02-20 DIAGNOSIS — R001 Bradycardia, unspecified: Secondary | ICD-10-CM | POA: Diagnosis not present

## 2019-02-20 DIAGNOSIS — I4891 Unspecified atrial fibrillation: Secondary | ICD-10-CM | POA: Diagnosis not present

## 2019-02-20 DIAGNOSIS — F0390 Unspecified dementia without behavioral disturbance: Secondary | ICD-10-CM | POA: Diagnosis not present

## 2019-02-20 DIAGNOSIS — L97822 Non-pressure chronic ulcer of other part of left lower leg with fat layer exposed: Secondary | ICD-10-CM | POA: Diagnosis not present

## 2019-02-20 DIAGNOSIS — I119 Hypertensive heart disease without heart failure: Secondary | ICD-10-CM | POA: Diagnosis not present

## 2019-02-20 DIAGNOSIS — Z436 Encounter for attention to other artificial openings of urinary tract: Secondary | ICD-10-CM | POA: Diagnosis not present

## 2019-02-20 DIAGNOSIS — N281 Cyst of kidney, acquired: Secondary | ICD-10-CM | POA: Diagnosis not present

## 2019-02-20 DIAGNOSIS — L97812 Non-pressure chronic ulcer of other part of right lower leg with fat layer exposed: Secondary | ICD-10-CM | POA: Diagnosis not present

## 2019-02-20 DIAGNOSIS — K219 Gastro-esophageal reflux disease without esophagitis: Secondary | ICD-10-CM | POA: Diagnosis not present

## 2019-02-20 DIAGNOSIS — N179 Acute kidney failure, unspecified: Secondary | ICD-10-CM | POA: Diagnosis not present

## 2019-02-20 DIAGNOSIS — Z87440 Personal history of urinary (tract) infections: Secondary | ICD-10-CM | POA: Diagnosis not present

## 2019-02-23 DIAGNOSIS — L97822 Non-pressure chronic ulcer of other part of left lower leg with fat layer exposed: Secondary | ICD-10-CM | POA: Diagnosis not present

## 2019-02-23 DIAGNOSIS — Q6211 Congenital occlusion of ureteropelvic junction: Secondary | ICD-10-CM | POA: Diagnosis not present

## 2019-02-23 DIAGNOSIS — L97812 Non-pressure chronic ulcer of other part of right lower leg with fat layer exposed: Secondary | ICD-10-CM | POA: Diagnosis not present

## 2019-02-23 DIAGNOSIS — Z436 Encounter for attention to other artificial openings of urinary tract: Secondary | ICD-10-CM | POA: Diagnosis not present

## 2019-02-23 DIAGNOSIS — I872 Venous insufficiency (chronic) (peripheral): Secondary | ICD-10-CM | POA: Diagnosis not present

## 2019-02-23 DIAGNOSIS — N179 Acute kidney failure, unspecified: Secondary | ICD-10-CM | POA: Diagnosis not present

## 2019-02-26 DIAGNOSIS — L97812 Non-pressure chronic ulcer of other part of right lower leg with fat layer exposed: Secondary | ICD-10-CM | POA: Diagnosis not present

## 2019-02-26 DIAGNOSIS — Q6211 Congenital occlusion of ureteropelvic junction: Secondary | ICD-10-CM | POA: Diagnosis not present

## 2019-02-26 DIAGNOSIS — Z436 Encounter for attention to other artificial openings of urinary tract: Secondary | ICD-10-CM | POA: Diagnosis not present

## 2019-02-26 DIAGNOSIS — L97822 Non-pressure chronic ulcer of other part of left lower leg with fat layer exposed: Secondary | ICD-10-CM | POA: Diagnosis not present

## 2019-02-26 DIAGNOSIS — I872 Venous insufficiency (chronic) (peripheral): Secondary | ICD-10-CM | POA: Diagnosis not present

## 2019-02-26 DIAGNOSIS — N179 Acute kidney failure, unspecified: Secondary | ICD-10-CM | POA: Diagnosis not present

## 2019-03-02 DIAGNOSIS — L97822 Non-pressure chronic ulcer of other part of left lower leg with fat layer exposed: Secondary | ICD-10-CM | POA: Diagnosis not present

## 2019-03-02 DIAGNOSIS — I872 Venous insufficiency (chronic) (peripheral): Secondary | ICD-10-CM | POA: Diagnosis not present

## 2019-03-02 DIAGNOSIS — Z436 Encounter for attention to other artificial openings of urinary tract: Secondary | ICD-10-CM | POA: Diagnosis not present

## 2019-03-02 DIAGNOSIS — L97812 Non-pressure chronic ulcer of other part of right lower leg with fat layer exposed: Secondary | ICD-10-CM | POA: Diagnosis not present

## 2019-03-02 DIAGNOSIS — Q6211 Congenital occlusion of ureteropelvic junction: Secondary | ICD-10-CM | POA: Diagnosis not present

## 2019-03-02 DIAGNOSIS — N179 Acute kidney failure, unspecified: Secondary | ICD-10-CM | POA: Diagnosis not present

## 2019-03-06 DIAGNOSIS — L97812 Non-pressure chronic ulcer of other part of right lower leg with fat layer exposed: Secondary | ICD-10-CM | POA: Diagnosis not present

## 2019-03-06 DIAGNOSIS — I872 Venous insufficiency (chronic) (peripheral): Secondary | ICD-10-CM | POA: Diagnosis not present

## 2019-03-06 DIAGNOSIS — Q6211 Congenital occlusion of ureteropelvic junction: Secondary | ICD-10-CM | POA: Diagnosis not present

## 2019-03-06 DIAGNOSIS — N179 Acute kidney failure, unspecified: Secondary | ICD-10-CM | POA: Diagnosis not present

## 2019-03-06 DIAGNOSIS — L97822 Non-pressure chronic ulcer of other part of left lower leg with fat layer exposed: Secondary | ICD-10-CM | POA: Diagnosis not present

## 2019-03-06 DIAGNOSIS — Z436 Encounter for attention to other artificial openings of urinary tract: Secondary | ICD-10-CM | POA: Diagnosis not present

## 2019-03-10 DIAGNOSIS — I872 Venous insufficiency (chronic) (peripheral): Secondary | ICD-10-CM | POA: Diagnosis not present

## 2019-03-10 DIAGNOSIS — L97822 Non-pressure chronic ulcer of other part of left lower leg with fat layer exposed: Secondary | ICD-10-CM | POA: Diagnosis not present

## 2019-03-10 DIAGNOSIS — Z436 Encounter for attention to other artificial openings of urinary tract: Secondary | ICD-10-CM | POA: Diagnosis not present

## 2019-03-10 DIAGNOSIS — Q6211 Congenital occlusion of ureteropelvic junction: Secondary | ICD-10-CM | POA: Diagnosis not present

## 2019-03-10 DIAGNOSIS — N179 Acute kidney failure, unspecified: Secondary | ICD-10-CM | POA: Diagnosis not present

## 2019-03-10 DIAGNOSIS — L97812 Non-pressure chronic ulcer of other part of right lower leg with fat layer exposed: Secondary | ICD-10-CM | POA: Diagnosis not present

## 2019-03-11 ENCOUNTER — Encounter (HOSPITAL_BASED_OUTPATIENT_CLINIC_OR_DEPARTMENT_OTHER): Payer: Medicare Other | Attending: Physician Assistant

## 2019-03-13 DIAGNOSIS — Q6211 Congenital occlusion of ureteropelvic junction: Secondary | ICD-10-CM | POA: Diagnosis not present

## 2019-03-13 DIAGNOSIS — N179 Acute kidney failure, unspecified: Secondary | ICD-10-CM | POA: Diagnosis not present

## 2019-03-13 DIAGNOSIS — L97822 Non-pressure chronic ulcer of other part of left lower leg with fat layer exposed: Secondary | ICD-10-CM | POA: Diagnosis not present

## 2019-03-13 DIAGNOSIS — Z436 Encounter for attention to other artificial openings of urinary tract: Secondary | ICD-10-CM | POA: Diagnosis not present

## 2019-03-13 DIAGNOSIS — L97812 Non-pressure chronic ulcer of other part of right lower leg with fat layer exposed: Secondary | ICD-10-CM | POA: Diagnosis not present

## 2019-03-13 DIAGNOSIS — I872 Venous insufficiency (chronic) (peripheral): Secondary | ICD-10-CM | POA: Diagnosis not present

## 2019-03-18 DIAGNOSIS — L97822 Non-pressure chronic ulcer of other part of left lower leg with fat layer exposed: Secondary | ICD-10-CM | POA: Diagnosis not present

## 2019-03-18 DIAGNOSIS — N179 Acute kidney failure, unspecified: Secondary | ICD-10-CM | POA: Diagnosis not present

## 2019-03-18 DIAGNOSIS — Z436 Encounter for attention to other artificial openings of urinary tract: Secondary | ICD-10-CM | POA: Diagnosis not present

## 2019-03-18 DIAGNOSIS — I872 Venous insufficiency (chronic) (peripheral): Secondary | ICD-10-CM | POA: Diagnosis not present

## 2019-03-18 DIAGNOSIS — Q6211 Congenital occlusion of ureteropelvic junction: Secondary | ICD-10-CM | POA: Diagnosis not present

## 2019-03-18 DIAGNOSIS — L97812 Non-pressure chronic ulcer of other part of right lower leg with fat layer exposed: Secondary | ICD-10-CM | POA: Diagnosis not present

## 2019-03-22 DIAGNOSIS — R001 Bradycardia, unspecified: Secondary | ICD-10-CM | POA: Diagnosis not present

## 2019-03-22 DIAGNOSIS — F0390 Unspecified dementia without behavioral disturbance: Secondary | ICD-10-CM | POA: Diagnosis not present

## 2019-03-22 DIAGNOSIS — E876 Hypokalemia: Secondary | ICD-10-CM | POA: Diagnosis not present

## 2019-03-22 DIAGNOSIS — Q6211 Congenital occlusion of ureteropelvic junction: Secondary | ICD-10-CM | POA: Diagnosis not present

## 2019-03-22 DIAGNOSIS — K219 Gastro-esophageal reflux disease without esophagitis: Secondary | ICD-10-CM | POA: Diagnosis not present

## 2019-03-22 DIAGNOSIS — Z436 Encounter for attention to other artificial openings of urinary tract: Secondary | ICD-10-CM | POA: Diagnosis not present

## 2019-03-22 DIAGNOSIS — M810 Age-related osteoporosis without current pathological fracture: Secondary | ICD-10-CM | POA: Diagnosis not present

## 2019-03-22 DIAGNOSIS — D509 Iron deficiency anemia, unspecified: Secondary | ICD-10-CM | POA: Diagnosis not present

## 2019-03-22 DIAGNOSIS — N281 Cyst of kidney, acquired: Secondary | ICD-10-CM | POA: Diagnosis not present

## 2019-03-22 DIAGNOSIS — Z87440 Personal history of urinary (tract) infections: Secondary | ICD-10-CM | POA: Diagnosis not present

## 2019-03-22 DIAGNOSIS — N179 Acute kidney failure, unspecified: Secondary | ICD-10-CM | POA: Diagnosis not present

## 2019-03-22 DIAGNOSIS — K4090 Unilateral inguinal hernia, without obstruction or gangrene, not specified as recurrent: Secondary | ICD-10-CM | POA: Diagnosis not present

## 2019-03-22 DIAGNOSIS — D72825 Bandemia: Secondary | ICD-10-CM | POA: Diagnosis not present

## 2019-03-22 DIAGNOSIS — I119 Hypertensive heart disease without heart failure: Secondary | ICD-10-CM | POA: Diagnosis not present

## 2019-03-22 DIAGNOSIS — I739 Peripheral vascular disease, unspecified: Secondary | ICD-10-CM | POA: Diagnosis not present

## 2019-03-22 DIAGNOSIS — I4891 Unspecified atrial fibrillation: Secondary | ICD-10-CM | POA: Diagnosis not present

## 2019-03-22 DIAGNOSIS — Z9181 History of falling: Secondary | ICD-10-CM | POA: Diagnosis not present

## 2019-03-22 DIAGNOSIS — I872 Venous insufficiency (chronic) (peripheral): Secondary | ICD-10-CM | POA: Diagnosis not present

## 2019-03-22 DIAGNOSIS — Z96652 Presence of left artificial knee joint: Secondary | ICD-10-CM | POA: Diagnosis not present

## 2019-03-25 DIAGNOSIS — K4090 Unilateral inguinal hernia, without obstruction or gangrene, not specified as recurrent: Secondary | ICD-10-CM | POA: Diagnosis not present

## 2019-03-25 DIAGNOSIS — N281 Cyst of kidney, acquired: Secondary | ICD-10-CM | POA: Diagnosis not present

## 2019-03-25 DIAGNOSIS — Q6211 Congenital occlusion of ureteropelvic junction: Secondary | ICD-10-CM | POA: Diagnosis not present

## 2019-03-25 DIAGNOSIS — I872 Venous insufficiency (chronic) (peripheral): Secondary | ICD-10-CM | POA: Diagnosis not present

## 2019-03-25 DIAGNOSIS — Z436 Encounter for attention to other artificial openings of urinary tract: Secondary | ICD-10-CM | POA: Diagnosis not present

## 2019-03-25 DIAGNOSIS — N179 Acute kidney failure, unspecified: Secondary | ICD-10-CM | POA: Diagnosis not present

## 2019-04-01 DIAGNOSIS — I872 Venous insufficiency (chronic) (peripheral): Secondary | ICD-10-CM | POA: Diagnosis not present

## 2019-04-01 DIAGNOSIS — N281 Cyst of kidney, acquired: Secondary | ICD-10-CM | POA: Diagnosis not present

## 2019-04-01 DIAGNOSIS — Q6211 Congenital occlusion of ureteropelvic junction: Secondary | ICD-10-CM | POA: Diagnosis not present

## 2019-04-01 DIAGNOSIS — Z436 Encounter for attention to other artificial openings of urinary tract: Secondary | ICD-10-CM | POA: Diagnosis not present

## 2019-04-01 DIAGNOSIS — N179 Acute kidney failure, unspecified: Secondary | ICD-10-CM | POA: Diagnosis not present

## 2019-04-01 DIAGNOSIS — K4090 Unilateral inguinal hernia, without obstruction or gangrene, not specified as recurrent: Secondary | ICD-10-CM | POA: Diagnosis not present

## 2019-04-06 ENCOUNTER — Other Ambulatory Visit: Payer: Self-pay | Admitting: Urology

## 2019-04-08 DIAGNOSIS — I872 Venous insufficiency (chronic) (peripheral): Secondary | ICD-10-CM | POA: Diagnosis not present

## 2019-04-08 DIAGNOSIS — Z436 Encounter for attention to other artificial openings of urinary tract: Secondary | ICD-10-CM | POA: Diagnosis not present

## 2019-04-08 DIAGNOSIS — N281 Cyst of kidney, acquired: Secondary | ICD-10-CM | POA: Diagnosis not present

## 2019-04-08 DIAGNOSIS — K4090 Unilateral inguinal hernia, without obstruction or gangrene, not specified as recurrent: Secondary | ICD-10-CM | POA: Diagnosis not present

## 2019-04-08 DIAGNOSIS — N179 Acute kidney failure, unspecified: Secondary | ICD-10-CM | POA: Diagnosis not present

## 2019-04-08 DIAGNOSIS — Q6211 Congenital occlusion of ureteropelvic junction: Secondary | ICD-10-CM | POA: Diagnosis not present

## 2019-04-09 NOTE — Patient Instructions (Signed)
Donna Woods  04/09/2019   YOU ARE REQUIRED TO BE TESTED FOR COVID-19 PRIOR TO YOUR SURGERY . YOUR TEST MUST BE COMPLETED ON Friday Apr 10, 2019. TESTING IS LOCATED AT [facility] ENTRANCE FROM 9:00AM - 3:00PM. FAILURE TO COMPLETE TESTING MAY RESULT IN CANCELLATION OF YOUR SURGERY.   ______________________________________________________________________________    Your procedure is scheduled on: 04-15-2019   Report to [facility] Main  Entrance     Report to admitting at 6:30AM     Call this number if you have problems the morning of surgery [phone]      Remember: Do not eat food or drink liquids :After Midnight. BRUSH YOUR TEETH MORNING OF SURGERY AND RINSE YOUR MOUTH OUT, NO CHEWING GUM CANDY OR MINTS.     Take these medicines the morning of surgery with A SIP OF WATER: ATENOLOL, CETIRIZINE                                  You may not have any metal on your body including hair pins and              piercings  Do not wear jewelry, make-up, lotions, powders or perfumes, deodorant             Do not wear nail polish.  Do not shave  48 hours prior to surgery.                Do not bring valuables to the hospital. [place].  Contacts, dentures or bridgework may not be worn into surgery.      Patients discharged the day of surgery will not be allowed to drive home. IF YOU ARE HAVING SURGERY AND GOING HOME THE SAME DAY, YOU MUST HAVE AN ADULT TO DRIVE YOU HOME AND BE WITH YOU FOR 24 HOURS. YOU MAY GO HOME BY TAXI OR UBER OR ORTHERWISE, BUT AN ADULT MUST ACCOMPANY YOU HOME AND STAY WITH YOU FOR 24 HOURS.  Name and phone number of your driver:  Special Instructions: N/A              Please read over the following fact sheets you were given: _____________________________________________________________________             [facility] - Preparing for  Surgery Before surgery, you can play an important role.  Because skin is not sterile, your skin needs to be as free of germs as possible.  You can reduce the number of germs on your skin by washing with CHG (chlorahexidine gluconate) soap before surgery.  CHG is an antiseptic cleaner which kills germs and bonds with the skin to continue killing germs even after washing. Please DO NOT use if you have an allergy to CHG or antibacterial soaps.  If your skin becomes reddened/irritated stop using the CHG and inform your nurse when you arrive at Short Stay. Do not shave (including legs and underarms) for at least 48 hours prior to the first CHG shower.  You may shave your face/neck. Please follow these instructions carefully:  1.  Shower with CHG Soap the night before surgery and the  morning of Surgery.  2.  If you choose to wash your hair, wash  your hair first as usual with your  normal  shampoo.  3.  After you shampoo, rinse your hair and body thoroughly to remove the  shampoo.                           4.  Use CHG as you would any other liquid soap.  You can apply chg directly  to the skin and wash                       Gently with a scrungie or clean washcloth.  5.  Apply the CHG Soap to your body ONLY FROM THE NECK DOWN.   Do not use on face/ open                           Wound or open sores. Avoid contact with eyes, ears mouth and genitals (private parts).                       Wash face,  Genitals (private parts) with your normal soap.             6.  Wash thoroughly, paying special attention to the area where your surgery  will be performed.  7.  Thoroughly rinse your body with warm water from the neck down.  8.  DO NOT shower/wash with your normal soap after using and rinsing off  the CHG Soap.                9.  Pat yourself dry with a clean towel.            10.  Wear clean pajamas.            11.  Place clean sheets on your bed the night of your first shower and do not  sleep with pets. Day  of Surgery : Do not apply any lotions/deodorants the morning of surgery.  Please wear clean clothes to the hospital/surgery center.  FAILURE TO FOLLOW THESE INSTRUCTIONS MAY RESULT IN THE CANCELLATION OF YOUR SURGERY PATIENT SIGNATURE_________________________________  NURSE SIGNATURE__________________________________  ________________________________________________________________________

## 2019-04-09 NOTE — Progress Notes (Signed)
Echo 12-19-2018 cxr 12-24-2018 lov pcp 01-20-2019 lov cards 09-26-18 ekg 07-15-18 Stress test 2016

## 2019-04-09 NOTE — Progress Notes (Signed)
CALLED PT TO COMPLETE COVID SCREENING. NO ANSWER. NO VOICEMAIL OPTION.

## 2019-04-10 ENCOUNTER — Other Ambulatory Visit (HOSPITAL_COMMUNITY)
Admission: RE | Admit: 2019-04-10 | Discharge: 2019-04-10 | Disposition: A | Discharge status: Home / Self Care | Payer: Medicare Other | Source: Ambulatory Visit | Attending: Urology | Admitting: Urology

## 2019-04-10 ENCOUNTER — Encounter (HOSPITAL_COMMUNITY): Payer: Self-pay

## 2019-04-10 ENCOUNTER — Encounter (HOSPITAL_COMMUNITY)
Admission: RE | Admit: 2019-04-10 | Discharge: 2019-04-10 | Disposition: A | Discharge status: Home / Self Care | Payer: Medicare Other | Source: Ambulatory Visit | Attending: Urology | Admitting: Urology

## 2019-04-10 ENCOUNTER — Other Ambulatory Visit: Payer: Self-pay

## 2019-04-10 DIAGNOSIS — Z1159 Encounter for screening for other viral diseases: Secondary | ICD-10-CM | POA: Diagnosis not present

## 2019-04-10 DIAGNOSIS — N390 Urinary tract infection, site not specified: Secondary | ICD-10-CM | POA: Diagnosis not present

## 2019-04-10 DIAGNOSIS — Z01812 Encounter for preprocedural laboratory examination: Secondary | ICD-10-CM | POA: Insufficient documentation

## 2019-04-10 HISTORY — DX: Other specified health status: Z78.9

## 2019-04-10 LAB — HEMOGLOBIN A1C
Hgb A1c MFr Bld: 5.7 % — ABNORMAL HIGH (ref 4.8–5.6)
Mean Plasma Glucose: 116.89 mg/dL

## 2019-04-10 LAB — BASIC METABOLIC PANEL
Anion gap: 6 (ref 5–15)
BUN: 44 mg/dL — ABNORMAL HIGH (ref 8–23)
CO2: 22 mmol/L (ref 22–32)
Calcium: 9.3 mg/dL (ref 8.9–10.3)
Chloride: 114 mmol/L — ABNORMAL HIGH (ref 98–111)
Creatinine, Ser: 1.45 mg/dL — ABNORMAL HIGH (ref 0.44–1.00)
GFR calc Af Amer: 39 mL/min — ABNORMAL LOW (ref >60)
GFR calc non Af Amer: 33 mL/min — ABNORMAL LOW (ref >60)
Glucose, Bld: 103 mg/dL — ABNORMAL HIGH (ref 70–99)
Potassium: 4.1 mmol/L (ref 3.5–5.1)
Sodium: 142 mmol/L (ref 135–145)

## 2019-04-10 LAB — CBC
HCT: 37 % (ref 36.0–46.0)
Hemoglobin: 11.7 g/dL — ABNORMAL LOW (ref 12.0–15.0)
MCH: 32 pg (ref 26.0–34.0)
MCHC: 31.6 g/dL (ref 30.0–36.0)
MCV: 101.1 fL — ABNORMAL HIGH (ref 80.0–100.0)
Platelets: 201 10*3/uL (ref 150–400)
RBC: 3.66 MIL/uL — ABNORMAL LOW (ref 3.87–5.11)
RDW: 13.7 % (ref 11.5–15.5)
WBC: 7.8 10*3/uL (ref 4.0–10.5)
nRBC: 0 % (ref 0.0–0.2)

## 2019-04-11 LAB — NOVEL CORONAVIRUS, NAA (HOSP ORDER, SEND-OUT TO REF LAB; TAT 18-24 HRS): SARS-CoV-2, NAA: NOT DETECTED

## 2019-04-13 NOTE — Progress Notes (Signed)
Anesthesia Chart Review   Case:  [id number] Date/Time:  04/15/19 0813   Procedure:  CYSTOSCOPY WITH RIGHT RETROGRADE PYELOGRAM, URETEROSCOPY  WITH BALLOON DILATION (Right )   Anesthesia type:  General   Pre-op diagnosis:  RIGHT URETERAL PELVIC JUNCTION OBSTRUCTION   Location:  [facility]   Surgeon:  Lucas Mallow, MD      DISCUSSION: 85 yo never smoker with h/o HTN, PVCs, GERD, progressive dementia, right ureteral pelvic junction obstruction scheduled for above procedure 04/15/19 with Dr. Link Snuffer.   Pt with recent hospitalization 12/19/2018-12/26/2018 with septic shock due to UTI.  Right nephrostomy tube placed 12/19/2018.  Discharged to SNF, discharged home from [facility] 01/14/2019.  Nephrostomy tube exchanged 01/23/19.    Pt followed up with PCP, Dr. Tedra Senegal, after discharge on 01/20/2019.      Last seen by Cardiologist, Dr. Quay Burow, 09/26/18.  Stable at this visit.  VS: BP (!) 155/63 (BP Location: Right Arm)   Pulse (!) 50   Temp 36.8 C (Oral)   Resp 18   Ht 5\' 6"  (1.676 m)   Wt 65.1 kg   SpO2 100%   BMI 23.16 kg/m   PROVIDERS: Elby Showers, MD is PCP   Quay Burow, MD is Cardiologist  LABS: Labs reviewed: Acceptable for surgery. (all labs ordered are listed, but only abnormal results are displayed)  Labs Reviewed  HEMOGLOBIN A1C - Abnormal; Notable for the following components:      Result Value   Hgb A1c MFr Bld 5.7 (*)    All other components within normal limits  BASIC METABOLIC PANEL - Abnormal; Notable for the following components:   Chloride 114 (*)    Glucose, Bld 103 (*)    BUN 44 (*)    Creatinine, Ser 1.45 (*)    GFR calc non Af Amer 33 (*)    GFR calc Af Amer 39 (*)    All other components within normal limits  CBC - Abnormal; Notable for the following components:   RBC 3.66 (*)    Hemoglobin 11.7 (*)    MCV 101.1 (*)    All other components within normal limits     IMAGES:   EKG: 07/15/2018 Rate 44 bpm Marked  sinus bradycardia   CV: Echo 12/19/2018 Interpretation Summary A complete two-dimensional transthoracic echocardiogram was performed (2D, M-mode, Doppler and color flow Doppler). The study was technically difficult. Ejection Fraction = >55%. The transmitral spectral Doppler flow pattern is suggestive of impaired LV relaxation. There is mild mitral regurgitation. There is mild tricuspid regurgitation. Mild to moderate aortic regurgitation.  Stress Test 10/19/2015  Nuclear stress EF: 57%.  The left ventricular ejection fraction is normal (55-65%).  There was no ST segment deviation noted during stress.  No T wave inversion was noted during stress.  This is a low risk study.   No reversible perfusion defects. LVEF 57% with normal wall motion. This is a low risk study.   Past Medical History:  Diagnosis Date  . Bradycardia   . DJD (degenerative joint disease)   . Fe deficiency anemia   . GERD (gastroesophageal reflux disease)   . HTN (hypertension)   . Hypokalemia   . Memory changes   . 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 Poor historian    pt records indicate hospitlaization in Jan 2020 in [place]. patient presents to pre-op with nephrostomy tub in place but unable to recall the hospitlization and procedure .   Marland Kitchen  Prediabetes   . PVC's (premature ventricular contractions)    denies any recent fluttering episodes, able to inhale deeply and exhale completely w/o difficulty, denies SOB   . Vitamin B deficiency     Past Surgical History:  Procedure Laterality Date  . ABDOMINAL HYSTERECTOMY    . BREAST SURGERY     BENIGN LUMP REMOVED FROM ?LEFT bREAST  . CHOLECYSTECTOMY    . JOINT REPLACEMENT     RTA  . NEPHROSTOMY     nephrostomy tube in place   . TONSILLECTOMY    . TOTAL ABDOMINAL HYSTERECTOMY W/ BILATERAL SALPINGOOPHORECTOMY     OVARY REMOVED A YR OR TWO BEFORE HYSTERECTOMY  . TOTAL KNEE ARTHROPLASTY Left 12/06/2015   Procedure: TOTAL KNEE ARTHROPLASTY;  Surgeon:  Garald Balding, MD;  Location: [facility];  Service: Orthopedics;  Laterality: Left;    MEDICATIONS: . acetaminophen (TYLENOL) 500 MG tablet  . atenolol (TENORMIN) 25 MG tablet  . cetirizine (ZYRTEC) 10 MG tablet  . cholecalciferol (VITAMIN D) 25 MCG (1000 UT) tablet  . escitalopram (LEXAPRO) 20 MG tablet  . lidocaine (LIDODERM) 5 %  . Multiple Vitamin (MULTIVITAMIN WITH MINERALS) TABS tablet  . nitrofurantoin, macrocrystal-monohydrate, (MACROBID) 100 MG capsule   No current facility-administered medications for this encounter.     Maia Plan [facility] Pre-Surgical Testing [phone] 04/13/19 1:07 PM

## 2019-04-13 NOTE — Anesthesia Preprocedure Evaluation (Addendum)
Anesthesia Evaluation    Reviewed: Allergy & Precautions, H&P , Patient's Chart, lab work & pertinent test results  Airway Mallampati: II  TM Distance: >3 FB Neck ROM: Full    Dental no notable dental hx.    Pulmonary neg pulmonary ROS,    Pulmonary exam normal breath sounds clear to auscultation       Cardiovascular Exercise Tolerance: Good hypertension, Pt. on medications and Pt. on home beta blockers Normal cardiovascular exam+ dysrhythmias  Rhythm:Regular Rate:Normal  EKG: 07/15/2018 Rate 44 bpm Marked sinus bradycardia   CV: Echo 12/19/2018 Interpretation Summary A complete two-dimensional transthoracic echocardiogram was performed (2D, M-mode, Doppler and color flow Doppler). TEjection Fraction = >55%. The transmitral spectral Doppler flow pattern is suggestive of impaired LV relaxation. There is mild mitral regurgitation. There is mild tricuspid regurgitation. Mild to moderate aortic regurgitation.  Stress Test 10/19/2015 The left ventricular ejection fraction is normal (55-65%).No reversible perfusion defects. LVEF 57% with normal wall motion. This is a low risk study.   Neuro/Psych PSYCHIATRIC DISORDERS Dementia  Neuromuscular disease    GI/Hepatic Neg liver ROS, GERD  Medicated,  Endo/Other  negative endocrine ROS  Renal/GU negative Renal ROS  negative genitourinary   Musculoskeletal  (+) Arthritis , Osteoarthritis,  Fibromyalgia -  Abdominal   Peds  Hematology  (+) Blood dyscrasia, anemia ,   Anesthesia Other Findings Day of surgery medications reviewed with the patient.  Reproductive/Obstetrics negative OB ROS                            Anesthesia Physical Anesthesia Plan  ASA: III  Anesthesia Plan: General   Post-op Pain Management:  Regional for Post-op pain   Induction:   PONV Risk Score and Plan:   Airway Management Planned: Nasal Cannula and Simple Face  Mask  Additional Equipment:   Intra-op Plan:   Post-operative Plan:   Informed Consent: I have reviewed the patients History and Physical, chart, labs and discussed the procedure including the risks, benefits and alternatives for the proposed anesthesia with the patient or authorized representative who has indicated his/her understanding and acceptance.       Plan Discussed with: CRNA, Anesthesiologist and Surgeon  Anesthesia Plan Comments: (See PAT note 04/10/2019, Konrad Felix, PA-C)      Anesthesia Quick Evaluation

## 2019-04-15 ENCOUNTER — Ambulatory Visit (HOSPITAL_COMMUNITY): Payer: Medicare Other | Admitting: Anesthesiology

## 2019-04-15 ENCOUNTER — Ambulatory Visit (HOSPITAL_COMMUNITY)
Admission: RE | Admit: 2019-04-15 | Discharge: 2019-04-15 | Disposition: A | Discharge status: Home / Self Care | Payer: Medicare Other | Attending: Urology | Admitting: Urology

## 2019-04-15 ENCOUNTER — Ambulatory Visit (HOSPITAL_COMMUNITY): Payer: Medicare Other | Admitting: Physician Assistant

## 2019-04-15 ENCOUNTER — Ambulatory Visit (HOSPITAL_COMMUNITY): Payer: Medicare Other

## 2019-04-15 ENCOUNTER — Encounter (HOSPITAL_COMMUNITY)
Admission: RE | Disposition: A | Discharge status: Home / Self Care | Payer: Self-pay | Source: Home / Self Care | Attending: Urology

## 2019-04-15 DIAGNOSIS — Z79899 Other long term (current) drug therapy: Secondary | ICD-10-CM | POA: Diagnosis not present

## 2019-04-15 DIAGNOSIS — F0390 Unspecified dementia without behavioral disturbance: Secondary | ICD-10-CM | POA: Insufficient documentation

## 2019-04-15 DIAGNOSIS — K219 Gastro-esophageal reflux disease without esophagitis: Secondary | ICD-10-CM | POA: Diagnosis not present

## 2019-04-15 DIAGNOSIS — M797 Fibromyalgia: Secondary | ICD-10-CM | POA: Diagnosis not present

## 2019-04-15 DIAGNOSIS — N130 Hydronephrosis with ureteropelvic junction obstruction: Secondary | ICD-10-CM | POA: Insufficient documentation

## 2019-04-15 DIAGNOSIS — M1990 Unspecified osteoarthritis, unspecified site: Secondary | ICD-10-CM | POA: Diagnosis not present

## 2019-04-15 DIAGNOSIS — N135 Crossing vessel and stricture of ureter without hydronephrosis: Secondary | ICD-10-CM | POA: Diagnosis not present

## 2019-04-15 DIAGNOSIS — I10 Essential (primary) hypertension: Secondary | ICD-10-CM | POA: Diagnosis not present

## 2019-04-15 DIAGNOSIS — Z888 Allergy status to other drugs, medicaments and biological substances status: Secondary | ICD-10-CM | POA: Insufficient documentation

## 2019-04-15 DIAGNOSIS — Z880 Allergy status to penicillin: Secondary | ICD-10-CM | POA: Diagnosis not present

## 2019-04-15 HISTORY — PX: CYSTOSCOPY WITH RETROGRADE PYELOGRAM, URETEROSCOPY AND STENT PLACEMENT: SHX5789

## 2019-04-15 SURGERY — CYSTOURETEROSCOPY, WITH RETROGRADE PYELOGRAM AND STENT INSERTION
Anesthesia: General | Site: Urethra | Laterality: Right

## 2019-04-15 MED ORDER — LIDOCAINE 2% (20 MG/ML) 5 ML SYRINGE
INTRAMUSCULAR | Status: DC | PRN
  Administered 2019-04-15: 80 mg via INTRAVENOUS

## 2019-04-15 MED ORDER — DEXAMETHASONE SODIUM PHOSPHATE 10 MG/ML IJ SOLN
INTRAMUSCULAR | Status: DC | PRN
  Administered 2019-04-15: 8 mg via INTRAVENOUS

## 2019-04-15 MED ORDER — NITROFURANTOIN MONOHYD MACRO 100 MG PO CAPS: 100.0000 mg | ORAL_CAPSULE | Freq: Two times a day (BID) | ORAL | 0 refills | Status: DC

## 2019-04-15 MED ORDER — EPHEDRINE SULFATE-NACL 50-0.9 MG/10ML-% IV SOSY
PREFILLED_SYRINGE | INTRAVENOUS | Status: DC | PRN
  Administered 2019-04-15 (×2): 10 mg via INTRAVENOUS

## 2019-04-15 MED ORDER — OXYCODONE HCL 5 MG PO TABS: 5.0000 mg | ORAL_TABLET | Freq: Once | ORAL | Status: DC | PRN

## 2019-04-15 MED ORDER — LACTATED RINGERS IV SOLN
INTRAVENOUS | Status: DC
  Administered 2019-04-15: 1000 mL via INTRAVENOUS
  Administered 2019-04-15: 07:00:00 via INTRAVENOUS

## 2019-04-15 MED ORDER — ONDANSETRON HCL 4 MG/2ML IJ SOLN
INTRAMUSCULAR | Status: DC | PRN
  Administered 2019-04-15: 4 mg via INTRAVENOUS

## 2019-04-15 MED ORDER — ACETAMINOPHEN 160 MG/5ML PO SOLN: 325.0000 mg | ORAL | Status: DC | PRN

## 2019-04-15 MED ORDER — PROPOFOL 10 MG/ML IV BOLUS
INTRAVENOUS | Status: DC | PRN
  Administered 2019-04-15: 100 mg via INTRAVENOUS

## 2019-04-15 MED ORDER — ACETAMINOPHEN 325 MG PO TABS: 325.0000 mg | ORAL_TABLET | ORAL | Status: DC | PRN

## 2019-04-15 MED ORDER — SODIUM CHLORIDE 0.9 % IV SOLN
1.0000 g | Freq: Once | INTRAVENOUS | Status: AC
  Administered 2019-04-15: 1 g via INTRAVENOUS
  Filled 2019-04-15: qty 1

## 2019-04-15 MED ORDER — EPHEDRINE 5 MG/ML INJ
INTRAVENOUS | Status: AC
  Filled 2019-04-15: qty 10

## 2019-04-15 MED ORDER — ONDANSETRON HCL 4 MG/2ML IJ SOLN
INTRAMUSCULAR | Status: AC
  Filled 2019-04-15: qty 2

## 2019-04-15 MED ORDER — FENTANYL CITRATE (PF) 100 MCG/2ML IJ SOLN: 25.0000 ug | INTRAMUSCULAR | Status: DC | PRN

## 2019-04-15 MED ORDER — FENTANYL CITRATE (PF) 100 MCG/2ML IJ SOLN
INTRAMUSCULAR | Status: DC | PRN
  Administered 2019-04-15 (×2): 25 ug via INTRAVENOUS

## 2019-04-15 MED ORDER — LIDOCAINE 2% (20 MG/ML) 5 ML SYRINGE
INTRAMUSCULAR | Status: AC
  Filled 2019-04-15: qty 5

## 2019-04-15 MED ORDER — CIPROFLOXACIN HCL 500 MG PO TABS: 500.0000 mg | ORAL_TABLET | Freq: Two times a day (BID) | ORAL | 0 refills | Status: AC

## 2019-04-15 MED ORDER — MEPERIDINE HCL 50 MG/ML IJ SOLN: 6.2500 mg | INTRAMUSCULAR | Status: DC | PRN

## 2019-04-15 MED ORDER — OXYCODONE HCL 5 MG/5ML PO SOLN: 5.0000 mg | Freq: Once | ORAL | Status: DC | PRN

## 2019-04-15 MED ORDER — CIPROFLOXACIN IN D5W 400 MG/200ML IV SOLN
400.0000 mg | INTRAVENOUS | Status: AC
  Administered 2019-04-15: 400 mg via INTRAVENOUS
  Filled 2019-04-15: qty 200

## 2019-04-15 MED ORDER — FENTANYL CITRATE (PF) 100 MCG/2ML IJ SOLN
INTRAMUSCULAR | Status: AC
  Filled 2019-04-15: qty 2

## 2019-04-15 MED ORDER — DEXAMETHASONE SODIUM PHOSPHATE 10 MG/ML IJ SOLN
INTRAMUSCULAR | Status: AC
  Filled 2019-04-15: qty 1

## 2019-04-15 MED ORDER — HYDROCODONE-ACETAMINOPHEN 5-325 MG PO TABS: 1.0000 | ORAL_TABLET | ORAL | 0 refills | Status: DC | PRN

## 2019-04-15 MED ORDER — PROPOFOL 10 MG/ML IV BOLUS
INTRAVENOUS | Status: AC
  Filled 2019-04-15: qty 20

## 2019-04-15 MED ORDER — ONDANSETRON HCL 4 MG/2ML IJ SOLN: 4.0000 mg | Freq: Once | INTRAMUSCULAR | Status: DC | PRN

## 2019-04-15 SURGICAL SUPPLY — 28 items
BAG URO CATCHER STRL LF (MISCELLANEOUS) ×3 IMPLANT
BASKET LASER NITINOL 1.9FR (BASKET) IMPLANT
BASKET ZERO TIP NITINOL 2.4FR (BASKET) IMPLANT
BSKT STON RTRVL 120 1.9FR (BASKET)
BSKT STON RTRVL ZERO TP 2.4FR (BASKET)
CATH INTERMIT  6FR 70CM (CATHETERS) ×2 IMPLANT
CATH URET 5FR 28IN CONE TIP (BALLOONS)
CATH URET 5FR 70CM CONE TIP (BALLOONS) IMPLANT
CLOTH BEACON ORANGE TIMEOUT ST (SAFETY) ×3 IMPLANT
COVER WAND RF STERILE (DRAPES) IMPLANT
EXTRACTOR STONE 1.7FRX115CM (UROLOGICAL SUPPLIES) IMPLANT
FIBER LASER FLEXIVA 365 (UROLOGICAL SUPPLIES) IMPLANT
FIBER LASER TRAC TIP (UROLOGICAL SUPPLIES) IMPLANT
GAUZE SPONGE 4X4 12PLY STRL (GAUZE/BANDAGES/DRESSINGS) ×2 IMPLANT
GLOVE BIO SURGEON STRL SZ7.5 (GLOVE) ×3 IMPLANT
GOWN STRL REUS W/TWL XL LVL3 (GOWN DISPOSABLE) ×3 IMPLANT
GUIDEWIRE ANG ZIPWIRE 038X150 (WIRE) IMPLANT
GUIDEWIRE STR DUAL SENSOR (WIRE) ×5 IMPLANT
KIT BALLN UROMAX 15FX4 (MISCELLANEOUS) IMPLANT
KIT BALLN UROMAX 26 75X4 (MISCELLANEOUS) ×2
KIT TURNOVER KIT A (KITS) IMPLANT
MANIFOLD NEPTUNE II (INSTRUMENTS) ×3 IMPLANT
PACK CYSTO (CUSTOM PROCEDURE TRAY) ×3 IMPLANT
SHEATH URETERAL 12FRX28CM (UROLOGICAL SUPPLIES) IMPLANT
SHEATH URETERAL 12FRX35CM (MISCELLANEOUS) IMPLANT
STENT ENDOURETEROTOMY 7-14 26C (STENTS) ×2 IMPLANT
TAPE CLOTH SURG 4X10 WHT LF (GAUZE/BANDAGES/DRESSINGS) ×2 IMPLANT
TUBING UROLOGY SET (TUBING) ×3 IMPLANT

## 2019-04-15 NOTE — Transfer of Care (Signed)
Immediate Anesthesia Transfer of Care Note  Patient: VIVICA DOBOSZ  Procedure(s) Performed: CYSTOSCOPY WITH RIGHT RETROGRADE PYELOGRAM, URETEROSCOPY  WITH BALLOON DILATION/INSERSTION OF URETERAL STENT/REMOVAL LEFT NEPHROSTOMY TUBE (Right Urethra)  Patient Location: PACU  Anesthesia Type:General  Level of Consciousness: awake, alert , oriented and patient cooperative  Airway & Oxygen Therapy: Patient Spontanous Breathing and Patient connected to face mask oxygen  Post-op Assessment: Report given to RN, Post -op Vital signs reviewed and stable and Patient moving all extremities  Post vital signs: Reviewed and stable  Last Vitals:  Vitals Value Taken Time  BP 145/66 04/15/2019  9:34 AM  Temp    Pulse 57 04/15/2019  9:36 AM  Resp 15 04/15/2019  9:36 AM  SpO2 100 % 04/15/2019  9:36 AM  Vitals shown include unvalidated device data.  Last Pain:  Vitals:   04/15/19 0652  TempSrc: Oral         Complications: No apparent anesthesia complications

## 2019-04-15 NOTE — Op Note (Signed)
Operative Note  Preoperative diagnosis:  1.  Right ureteropelvic junction obstruction  Postoperative diagnosis: 1.  Right ureteropelvic junction obstruction  Procedure(s): 1.  Cystoscopy with right retrograde pyelogram, right ureteral dilation of UPJ stricture, right ureteral stent placement  Surgeon: Link Snuffer, MD  Assistants: Andee Poles, MD  Anesthesia: General  Complications: None immediate  EBL: None  Specimens: 1.  None  Drains/Catheters: 1.  Tapered endopyelotomy stent, 14 French proximally, 6 French distally, 24 cm  Intraoperative findings: 1.  Normal urethra and bladder 2.  Right retrograde pyelogram revealed approximately 2 cm area of narrowing at the right ureteropelvic junction with upstream severe hydronephrosis.  Indication: 85 year old female recently admitted to an outside hospital with severe sepsis found to have ureteropelvic junction obstruction status post nephrostomy tube placement.  Renal scan confirmed ureteropelvic junction obstruction with good split renal function.  She therefore presents for the previously mentioned operation.  Description of procedure:  The patient was identified and consent was obtained.  The patient was taken to the operating room and placed in the supine position.  The patient was placed under general anesthesia.  Perioperative antibiotics were administered.  The patient was placed in dorsal lithotomy.  Patient was prepped and draped in a standard sterile fashion and a timeout was performed.  A 21 French rigid cystoscope was advanced into the urethra and into the bladder.  Please cystoscopy was performed with findings noted above.  The right ureter was cannulated with a sensor wire which was advanced up to the kidney under fluoroscopic guidance.  An open-ended ureteral catheter was advanced over the wire up a portion of the ureter and the wire was withdrawn.  Retrograde pyelogram was performed with the findings noted above.  The  wire was reintroduced and advanced into the kidney under fluoroscopic guidance.  The open ended ureteral catheter was withdrawn.  Under fluoroscopic guidance, the ureteral balloon dilator was advanced over the wire and transverse the strictured area.  Balloon dilation was then performed under fluoroscopic guidance and I dilated it for about 2 minutes.  The balloon was deflated and advanced more distally and reinflated for a second dilation again for about 2 minutes.  This was deflated and withdrawn.  An endopyelotomy stent was then advanced over the wire under fluoroscopic guidance until the 14 French portion transversed the strictured area.  The wire was withdrawn.  There was a good coil within the renal pelvis and a 14 French portion still transversed past the strictured area.  There is a good coil within the bladder confirmed cystoscopically.  Under fluoroscopic guidance, the right sided nephrostomy tube was removed.  Fluoroscopy again confirmed good proximal placement of the endopyelotomy stent and direct visualization confirmed a good coil within the bladder.  The bladder was drained and the scope withdrawn.  The patient tolerated the procedure well and was stable postoperatively.  Plan: Return in 6 weeks for stent removal.

## 2019-04-15 NOTE — Discharge Instructions (Addendum)
[facility] [phone] Post Ureteroscopy With or Without Stent Instructions  Definitions:  Ureter: The duct that transports urine from the kidney to the bladder. Stent:   A plastic hollow tube that is placed into the ureter, from the kidney to the  bladder to prevent the ureter from swelling shut.  GENERAL INSTRUCTIONS:  Despite the fact that no skin incisions were used, the area around the ureter and bladder is raw and irritated. The stent is a foreign body which will further irritate the bladder wall. This irritation is manifested by increased frequency of urination, both day and night, and by an increase in the urge to urinate. In some, the urge to urinate is present almost always. Sometimes the urge is strong enough that you may not be able to stop yourself from urinating. The only real cure is to remove the stent and then give time for the bladder wall to heal which can't be done until the danger of the ureter swelling shut has passed, which varies.  You may see some blood in your urine while the stent is in place and a few days afterwards. Do not be alarmed, even if the urine was clear for a while. Get off your feet and drink lots of fluids until clearing occurs. If you start to pass clots or don't improve, call us.  DIET: You may return to your normal diet immediately. Because of the raw surface of your bladder, alcohol, spicy foods, acid type foods and drinks with caffeine may cause irritation or frequency and should be used in moderation. To keep your urine flowing freely and to avoid constipation, drink plenty of fluids during the day ( 8-10 glasses ). Tip: Avoid cranberry juice because it is very acidic.  ACTIVITY: Your physical activity doesn't need to be restricted. However, if you are very active, you may see some blood in your urine. We suggest that you reduce your activity under these circumstances until the bleeding has stopped.  BOWELS: It is important to  keep your bowels regular during the postoperative period. Straining with bowel movements can cause bleeding. A bowel movement every other day is reasonable. Use a mild laxative if needed, such as Milk of Magnesia 2-3 tablespoons, or 2 Dulcolax tablets. Call if you continue to have problems. If you have been taking narcotics for pain, before, during or after your surgery, you may be constipated. Take a laxative if necessary.   MEDICATION: You should resume your pre-surgery medications unless told not to. You may take oxybutynin or flomax if prescribed for bladder spasms or discomfort from the stent Take pain medication as directed for pain refractory to conservative management  PROBLEMS YOU SHOULD REPORT TO [place]:  Fevers over 100.5 Fahrenheit.  Heavy bleeding, or clots ( See above notes about blood in urine ).  Inability to urinate.  Drug reactions ( hives, rash, nausea, vomiting, diarrhea ).  Severe burning or pain with urination that is not improving.     Moderate Conscious Sedation, Adult, Care After These instructions provide you with information about caring for yourself after your procedure. Your health care provider may also give you more specific instructions. Your treatment has been planned according to current medical practices, but problems sometimes occur. Call your health care provider if you have any problems or questions after your procedure. What can I expect after the procedure? After your procedure, it is common:  To feel sleepy for several hours.  To feel clumsy and have poor balance for several hours.  To have poor judgment for several hours.  To vomit if you eat too soon. Follow these instructions at home: For at least 24 hours after the procedure:   Do not: ? Participate in activities where you could fall or become injured. ? Drive. ? Use heavy machinery. ? Drink alcohol. ? Take sleeping pills or medicines that cause drowsiness. ? Make important  decisions or sign legal documents. ? Take care of children on your own.  Rest. Eating and drinking  Follow the diet recommended by your health care provider.  If you vomit: ? Drink water, juice, or soup when you can drink without vomiting. ? Make sure you have little or no nausea before eating solid foods. General instructions  Have a responsible adult stay with you until you are awake and alert.  Take over-the-counter and prescription medicines only as told by your health care provider.  If you smoke, do not smoke without supervision.  Keep all follow-up visits as told by your health care provider. This is important. Contact a health care provider if:  You keep feeling nauseous or you keep vomiting.  You feel light-headed.  You develop a rash.  You have a fever. Get help right away if:  You have trouble breathing. This information is not intended to replace advice given to you by your health care provider. Make sure you discuss any questions you have with your health care provider. Document Released: 09/02/2013 Document Revised: 04/16/2016 Document Reviewed: 03/03/2016 Elsevier Interactive Patient Education  2019 [employer].

## 2019-04-15 NOTE — H&P (Signed)
CC/HPI: CC: right ureteropelvic junction obstruction  HPI:  01/30/2019  85 year old female recently admitted to an outside hospital with severe sepsis. This required a prolonged hospital stay including several days in the ICU on pressors. She had a CT without contrast performed on 12/18/2018 that revealed findings consistent with right ureteropelvic junction obstruction. She had a right percutaneous nephrostomy tube placed on 12/20/2018. Blood culture and urine cultures were negative. I do not have the images for review But I do have the imaging reports. She has been doing well with the nephrostomy tube. It is draining appropriately. She has a history of open cholecystectomy as well as an open hysterectomy. She did have an MRI of the lumbar spine back in 2011. The right kidney is visualized and appears to have mild to moderate ureteropelvic junction obstruction.   02/12/2019  Patient completed a renal scan with Lasix on 02/04/2019. This revealed a normally functioning left kidney with a prominent extrarenal pelvis. The right kidney had obvious partial obstruction at the level of the ureteropelvic junction. Split renal function was 53% from the left, 47% from the right. CT scan revealed evidence of UPJ obstruction on the right. Nephrostomy tube was in good place. I did not see an obvious crossing vessel. There is a nonenhancing cyst on the left kidney.     ALLERGIES: Olmesartan Penicillin - Skin Rash    MEDICATIONS: Prilosec  Zyrtec  ALJ Supplement  Atenolol  Escitalopram Oxalate  Geritol Complete  Lipoflavanoid  Vitamin D3     GU PSH: Locm 300-399Mg /Ml Iodine,1Ml - 02/06/2019      PSH Notes: knee replacement, hysterectomy, and cholecystectomy   NON-GU PSH: No Non-GU PSH    GU PMH: Hydronephrosis - 01/30/2019 Ureteral stricture - 01/30/2019 Acute kidney failure Urinary Tract Inf, Unspec site    NON-GU PMH: Arrhythmia Arthritis Depression GERD Hypertension Meningitis, unspecified     FAMILY HISTORY: 2 daughters - Daughter father deceased - Father mother deceased - Mother    Notes: ureter reimplantation daughter 54   SOCIAL HISTORY: Marital Status: Married Current Smoking Status: Patient has never smoked.   Tobacco Use Assessment Completed: Used Tobacco in last 30 days? Has never drank.  Drinks 2 caffeinated drinks per day. 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    REVIEW OF SYSTEMS:    GU Review Female:   Patient denies frequent urination, hard to postpone urination, burning /pain with urination, get up at night to urinate, leakage of urine, stream starts and stops, trouble starting your stream, have to strain to urinate, and being pregnant.  Gastrointestinal (Upper):   Patient denies nausea, vomiting, and indigestion/ heartburn.  Gastrointestinal (Lower):   Patient denies diarrhea and constipation.  Constitutional:   Patient denies fever, night sweats, weight loss, and fatigue.  Skin:   Patient denies skin rash/ lesion and itching.  Eyes:   Patient denies blurred vision and double vision.  Ears/ Nose/ Throat:   Patient denies sore throat and sinus problems.  Hematologic/Lymphatic:   Patient denies swollen glands and easy bruising.  Cardiovascular:   Patient denies leg swelling and chest pains.  Respiratory:   Patient denies cough and shortness of breath.  Endocrine:   Patient denies excessive thirst.  Musculoskeletal:   Patient denies back pain and joint pain.  Neurological:   Patient denies headaches and dizziness.  Psychologic:   Patient denies depression and anxiety.   VITAL SIGNS:      02/12/2019 01:31 PM  BP 124/61 mmHg  Heart Rate 42 /min  Temperature 98.6  F / 48 C   MULTI-SYSTEM PHYSICAL EXAMINATION:    Constitutional: Elderly female in no acute distress  Respiratory: No labored breathing, no use of accessory muscles.   Cardiovascular: Normal temperature, adequate perfusion of extremities  Skin: No paleness, no jaundice  Neurologic /  Psychiatric: Oriented to time, oriented to place, oriented to person. No depression, no anxiety, no agitation.  Gastrointestinal: No mass, no tenderness, no rigidity, non obese abdomen.  Eyes: Normal conjunctivae. Normal eyelids.  Musculoskeletal: Normal gait and station of head and neck.     PAST DATA REVIEWED:  Source Of History:  Patient  Records Review:   Previous Patient Records  X-Ray Review: C.T. Abdomen/Pelvis: Reviewed Films. Reviewed Report. Discussed With Patient.  Lasix Renogram: Reviewed Films. Reviewed Report. Discussed With Patient.     PROCEDURES:          Urinalysis w/Scope Dipstick Dipstick Cont'd Micro  Color: Yellow Bilirubin: Neg mg/dL WBC/hpf: >60/hpf  Appearance: Turbid Ketones: Neg mg/dL RBC/hpf: 3 - 10/hpf  Specific Gravity: 1.025 Blood: 2+ ery/uL Bacteria: Many (>50/hpf)  pH: 5.5 Protein: Neg mg/dL Cystals: NS (Not Seen)  Glucose: Neg mg/dL Urobilinogen: 0.2 mg/dL Casts: NS (Not Seen)    Nitrites: Positive Trichomonas: Not Present    Leukocyte Esterase: 3+ leu/uL Mucous: Not Present      Epithelial Cells: 0 - 5/hpf      Yeast: NS (Not Seen)      Sperm: Not Present    ASSESSMENT:      ICD-10 Details  1 GU:   Ureteral stricture - N13.5   2   Hydronephrosis - N13.0    PLAN:           Orders Labs Urine Culture          Document Letter(s):  Created for Patient: Clinical Summary         Notes:   Recommend proceeding with endoscopic management with cystoscopy, right retrograde pyelogram, right ureteroscopy with possible balloon dilation versus laser endopyelotomy. She understands potential risks including but not limited to bleeding, infection, injury to surrounding structures, need for additional procedures.   Cc: Emeline General, M.D.

## 2019-04-15 NOTE — Anesthesia Postprocedure Evaluation (Signed)
Anesthesia Post Note  Patient: Donna Woods  Procedure(s) Performed: CYSTOSCOPY WITH RIGHT RETROGRADE PYELOGRAM, URETEROSCOPY  WITH BALLOON DILATION/INSERSTION OF URETERAL STENT/REMOVAL LEFT NEPHROSTOMY TUBE (Right Urethra)     Patient location during evaluation: PACU Anesthesia Type: General Level of consciousness: awake and alert Pain management: pain level controlled Vital Signs Assessment: post-procedure vital signs reviewed and stable Respiratory status: spontaneous breathing, nonlabored ventilation, respiratory function stable and patient connected to nasal cannula oxygen Cardiovascular status: blood pressure returned to baseline and stable Postop Assessment: no apparent nausea or vomiting Anesthetic complications: no    Last Vitals:  Vitals:   04/15/19 0652 04/15/19 0934  BP: (!) 145/55 (!) 145/66  Pulse: 73 (!) 56  Resp: 15 10  Temp: 36.8 C (!) 36.2 C  SpO2: 99% 100%    Last Pain:  Vitals:   04/15/19 0934  TempSrc:   PainSc: 0-No pain                 Paige Monarrez

## 2019-04-15 NOTE — Anesthesia Procedure Notes (Signed)
Procedure Name: LMA Insertion Date/Time: 04/15/2019 8:35 AM Performed by: Victoriano Lain, CRNA Pre-anesthesia Checklist: Patient identified, Emergency Drugs available, Suction available, Patient being monitored and Timeout performed Patient Re-evaluated:Patient Re-evaluated prior to induction Oxygen Delivery Method: Circle system utilized Preoxygenation: Pre-oxygenation with 100% oxygen Induction Type: IV induction LMA: LMA with gastric port inserted LMA Size: 4.0 Number of attempts: 1 Placement Confirmation: positive ETCO2 and breath sounds checked- equal and bilateral Tube secured with: Tape Dental Injury: Teeth and Oropharynx as per pre-operative assessment

## 2019-04-16 ENCOUNTER — Encounter (HOSPITAL_COMMUNITY): Payer: Self-pay | Admitting: Urology

## 2019-04-16 DIAGNOSIS — N179 Acute kidney failure, unspecified: Secondary | ICD-10-CM | POA: Diagnosis not present

## 2019-04-16 DIAGNOSIS — Q6211 Congenital occlusion of ureteropelvic junction: Secondary | ICD-10-CM | POA: Diagnosis not present

## 2019-04-16 DIAGNOSIS — Z436 Encounter for attention to other artificial openings of urinary tract: Secondary | ICD-10-CM | POA: Diagnosis not present

## 2019-04-16 DIAGNOSIS — I872 Venous insufficiency (chronic) (peripheral): Secondary | ICD-10-CM | POA: Diagnosis not present

## 2019-04-16 DIAGNOSIS — K4090 Unilateral inguinal hernia, without obstruction or gangrene, not specified as recurrent: Secondary | ICD-10-CM | POA: Diagnosis not present

## 2019-04-16 DIAGNOSIS — N281 Cyst of kidney, acquired: Secondary | ICD-10-CM | POA: Diagnosis not present

## 2019-04-21 DIAGNOSIS — D72825 Bandemia: Secondary | ICD-10-CM | POA: Diagnosis not present

## 2019-04-21 DIAGNOSIS — Z96652 Presence of left artificial knee joint: Secondary | ICD-10-CM | POA: Diagnosis not present

## 2019-04-21 DIAGNOSIS — K219 Gastro-esophageal reflux disease without esophagitis: Secondary | ICD-10-CM | POA: Diagnosis not present

## 2019-04-21 DIAGNOSIS — I872 Venous insufficiency (chronic) (peripheral): Secondary | ICD-10-CM | POA: Diagnosis not present

## 2019-04-21 DIAGNOSIS — Z436 Encounter for attention to other artificial openings of urinary tract: Secondary | ICD-10-CM | POA: Diagnosis not present

## 2019-04-21 DIAGNOSIS — I739 Peripheral vascular disease, unspecified: Secondary | ICD-10-CM | POA: Diagnosis not present

## 2019-04-21 DIAGNOSIS — Z87440 Personal history of urinary (tract) infections: Secondary | ICD-10-CM | POA: Diagnosis not present

## 2019-04-21 DIAGNOSIS — R001 Bradycardia, unspecified: Secondary | ICD-10-CM | POA: Diagnosis not present

## 2019-04-21 DIAGNOSIS — K4090 Unilateral inguinal hernia, without obstruction or gangrene, not specified as recurrent: Secondary | ICD-10-CM | POA: Diagnosis not present

## 2019-04-21 DIAGNOSIS — I4891 Unspecified atrial fibrillation: Secondary | ICD-10-CM | POA: Diagnosis not present

## 2019-04-21 DIAGNOSIS — F0390 Unspecified dementia without behavioral disturbance: Secondary | ICD-10-CM | POA: Diagnosis not present

## 2019-04-21 DIAGNOSIS — N179 Acute kidney failure, unspecified: Secondary | ICD-10-CM | POA: Diagnosis not present

## 2019-04-21 DIAGNOSIS — N281 Cyst of kidney, acquired: Secondary | ICD-10-CM | POA: Diagnosis not present

## 2019-04-21 DIAGNOSIS — Q6211 Congenital occlusion of ureteropelvic junction: Secondary | ICD-10-CM | POA: Diagnosis not present

## 2019-04-21 DIAGNOSIS — D509 Iron deficiency anemia, unspecified: Secondary | ICD-10-CM | POA: Diagnosis not present

## 2019-04-21 DIAGNOSIS — M810 Age-related osteoporosis without current pathological fracture: Secondary | ICD-10-CM | POA: Diagnosis not present

## 2019-04-21 DIAGNOSIS — I119 Hypertensive heart disease without heart failure: Secondary | ICD-10-CM | POA: Diagnosis not present

## 2019-04-21 DIAGNOSIS — Z9181 History of falling: Secondary | ICD-10-CM | POA: Diagnosis not present

## 2019-04-21 DIAGNOSIS — E876 Hypokalemia: Secondary | ICD-10-CM | POA: Diagnosis not present

## 2019-04-22 DIAGNOSIS — Q6211 Congenital occlusion of ureteropelvic junction: Secondary | ICD-10-CM | POA: Diagnosis not present

## 2019-04-22 DIAGNOSIS — Z436 Encounter for attention to other artificial openings of urinary tract: Secondary | ICD-10-CM | POA: Diagnosis not present

## 2019-04-22 DIAGNOSIS — N179 Acute kidney failure, unspecified: Secondary | ICD-10-CM | POA: Diagnosis not present

## 2019-04-22 DIAGNOSIS — N281 Cyst of kidney, acquired: Secondary | ICD-10-CM | POA: Diagnosis not present

## 2019-04-22 DIAGNOSIS — I872 Venous insufficiency (chronic) (peripheral): Secondary | ICD-10-CM | POA: Diagnosis not present

## 2019-04-22 DIAGNOSIS — K4090 Unilateral inguinal hernia, without obstruction or gangrene, not specified as recurrent: Secondary | ICD-10-CM | POA: Diagnosis not present

## 2019-05-06 DIAGNOSIS — Q6211 Congenital occlusion of ureteropelvic junction: Secondary | ICD-10-CM | POA: Diagnosis not present

## 2019-05-06 DIAGNOSIS — N281 Cyst of kidney, acquired: Secondary | ICD-10-CM | POA: Diagnosis not present

## 2019-05-06 DIAGNOSIS — I872 Venous insufficiency (chronic) (peripheral): Secondary | ICD-10-CM | POA: Diagnosis not present

## 2019-05-06 DIAGNOSIS — N179 Acute kidney failure, unspecified: Secondary | ICD-10-CM | POA: Diagnosis not present

## 2019-05-06 DIAGNOSIS — Z436 Encounter for attention to other artificial openings of urinary tract: Secondary | ICD-10-CM | POA: Diagnosis not present

## 2019-05-06 DIAGNOSIS — K4090 Unilateral inguinal hernia, without obstruction or gangrene, not specified as recurrent: Secondary | ICD-10-CM | POA: Diagnosis not present

## 2019-05-20 DIAGNOSIS — Z436 Encounter for attention to other artificial openings of urinary tract: Secondary | ICD-10-CM | POA: Diagnosis not present

## 2019-05-20 DIAGNOSIS — K4090 Unilateral inguinal hernia, without obstruction or gangrene, not specified as recurrent: Secondary | ICD-10-CM | POA: Diagnosis not present

## 2019-05-20 DIAGNOSIS — I872 Venous insufficiency (chronic) (peripheral): Secondary | ICD-10-CM | POA: Diagnosis not present

## 2019-05-20 DIAGNOSIS — N179 Acute kidney failure, unspecified: Secondary | ICD-10-CM | POA: Diagnosis not present

## 2019-05-20 DIAGNOSIS — N281 Cyst of kidney, acquired: Secondary | ICD-10-CM | POA: Diagnosis not present

## 2019-05-20 DIAGNOSIS — Q6211 Congenital occlusion of ureteropelvic junction: Secondary | ICD-10-CM | POA: Diagnosis not present

## 2019-05-21 DIAGNOSIS — L97818 Non-pressure chronic ulcer of other part of right lower leg with other specified severity: Secondary | ICD-10-CM | POA: Diagnosis not present

## 2019-05-21 DIAGNOSIS — Z87440 Personal history of urinary (tract) infections: Secondary | ICD-10-CM | POA: Diagnosis not present

## 2019-05-21 DIAGNOSIS — I4891 Unspecified atrial fibrillation: Secondary | ICD-10-CM | POA: Diagnosis not present

## 2019-05-21 DIAGNOSIS — Q6211 Congenital occlusion of ureteropelvic junction: Secondary | ICD-10-CM | POA: Diagnosis not present

## 2019-05-21 DIAGNOSIS — N179 Acute kidney failure, unspecified: Secondary | ICD-10-CM | POA: Diagnosis not present

## 2019-05-21 DIAGNOSIS — D509 Iron deficiency anemia, unspecified: Secondary | ICD-10-CM | POA: Diagnosis not present

## 2019-05-21 DIAGNOSIS — F0390 Unspecified dementia without behavioral disturbance: Secondary | ICD-10-CM | POA: Diagnosis not present

## 2019-05-21 DIAGNOSIS — R001 Bradycardia, unspecified: Secondary | ICD-10-CM | POA: Diagnosis not present

## 2019-05-21 DIAGNOSIS — I119 Hypertensive heart disease without heart failure: Secondary | ICD-10-CM | POA: Diagnosis not present

## 2019-05-21 DIAGNOSIS — I872 Venous insufficiency (chronic) (peripheral): Secondary | ICD-10-CM | POA: Diagnosis not present

## 2019-05-21 DIAGNOSIS — K219 Gastro-esophageal reflux disease without esophagitis: Secondary | ICD-10-CM | POA: Diagnosis not present

## 2019-05-21 DIAGNOSIS — L97828 Non-pressure chronic ulcer of other part of left lower leg with other specified severity: Secondary | ICD-10-CM | POA: Diagnosis not present

## 2019-05-21 DIAGNOSIS — M810 Age-related osteoporosis without current pathological fracture: Secondary | ICD-10-CM | POA: Diagnosis not present

## 2019-05-21 DIAGNOSIS — Z9181 History of falling: Secondary | ICD-10-CM | POA: Diagnosis not present

## 2019-05-21 DIAGNOSIS — D72825 Bandemia: Secondary | ICD-10-CM | POA: Diagnosis not present

## 2019-05-21 DIAGNOSIS — I739 Peripheral vascular disease, unspecified: Secondary | ICD-10-CM | POA: Diagnosis not present

## 2019-05-21 DIAGNOSIS — Z96652 Presence of left artificial knee joint: Secondary | ICD-10-CM | POA: Diagnosis not present

## 2019-05-21 DIAGNOSIS — K4090 Unilateral inguinal hernia, without obstruction or gangrene, not specified as recurrent: Secondary | ICD-10-CM | POA: Diagnosis not present

## 2019-05-21 DIAGNOSIS — Z436 Encounter for attention to other artificial openings of urinary tract: Secondary | ICD-10-CM | POA: Diagnosis not present

## 2019-05-21 DIAGNOSIS — N281 Cyst of kidney, acquired: Secondary | ICD-10-CM | POA: Diagnosis not present

## 2019-05-22 DIAGNOSIS — Z436 Encounter for attention to other artificial openings of urinary tract: Secondary | ICD-10-CM | POA: Diagnosis not present

## 2019-05-22 DIAGNOSIS — L97828 Non-pressure chronic ulcer of other part of left lower leg with other specified severity: Secondary | ICD-10-CM | POA: Diagnosis not present

## 2019-05-22 DIAGNOSIS — L97818 Non-pressure chronic ulcer of other part of right lower leg with other specified severity: Secondary | ICD-10-CM | POA: Diagnosis not present

## 2019-05-22 DIAGNOSIS — Q6211 Congenital occlusion of ureteropelvic junction: Secondary | ICD-10-CM | POA: Diagnosis not present

## 2019-05-22 DIAGNOSIS — I872 Venous insufficiency (chronic) (peripheral): Secondary | ICD-10-CM | POA: Diagnosis not present

## 2019-05-22 DIAGNOSIS — N179 Acute kidney failure, unspecified: Secondary | ICD-10-CM | POA: Diagnosis not present

## 2019-05-25 DIAGNOSIS — I872 Venous insufficiency (chronic) (peripheral): Secondary | ICD-10-CM | POA: Diagnosis not present

## 2019-05-25 DIAGNOSIS — Q6211 Congenital occlusion of ureteropelvic junction: Secondary | ICD-10-CM | POA: Diagnosis not present

## 2019-05-25 DIAGNOSIS — Z436 Encounter for attention to other artificial openings of urinary tract: Secondary | ICD-10-CM | POA: Diagnosis not present

## 2019-05-25 DIAGNOSIS — L97818 Non-pressure chronic ulcer of other part of right lower leg with other specified severity: Secondary | ICD-10-CM | POA: Diagnosis not present

## 2019-05-25 DIAGNOSIS — N179 Acute kidney failure, unspecified: Secondary | ICD-10-CM | POA: Diagnosis not present

## 2019-05-25 DIAGNOSIS — L97828 Non-pressure chronic ulcer of other part of left lower leg with other specified severity: Secondary | ICD-10-CM | POA: Diagnosis not present

## 2019-05-27 DIAGNOSIS — N135 Crossing vessel and stricture of ureter without hydronephrosis: Secondary | ICD-10-CM | POA: Diagnosis not present

## 2019-05-27 DIAGNOSIS — N390 Urinary tract infection, site not specified: Secondary | ICD-10-CM | POA: Diagnosis not present

## 2019-05-28 DIAGNOSIS — I872 Venous insufficiency (chronic) (peripheral): Secondary | ICD-10-CM | POA: Diagnosis not present

## 2019-05-28 DIAGNOSIS — Q6211 Congenital occlusion of ureteropelvic junction: Secondary | ICD-10-CM | POA: Diagnosis not present

## 2019-05-28 DIAGNOSIS — L97828 Non-pressure chronic ulcer of other part of left lower leg with other specified severity: Secondary | ICD-10-CM | POA: Diagnosis not present

## 2019-05-28 DIAGNOSIS — L97818 Non-pressure chronic ulcer of other part of right lower leg with other specified severity: Secondary | ICD-10-CM | POA: Diagnosis not present

## 2019-05-28 DIAGNOSIS — Z436 Encounter for attention to other artificial openings of urinary tract: Secondary | ICD-10-CM | POA: Diagnosis not present

## 2019-05-28 DIAGNOSIS — N179 Acute kidney failure, unspecified: Secondary | ICD-10-CM | POA: Diagnosis not present

## 2019-06-01 DIAGNOSIS — I872 Venous insufficiency (chronic) (peripheral): Secondary | ICD-10-CM | POA: Diagnosis not present

## 2019-06-01 DIAGNOSIS — N179 Acute kidney failure, unspecified: Secondary | ICD-10-CM | POA: Diagnosis not present

## 2019-06-01 DIAGNOSIS — L97828 Non-pressure chronic ulcer of other part of left lower leg with other specified severity: Secondary | ICD-10-CM | POA: Diagnosis not present

## 2019-06-01 DIAGNOSIS — Z436 Encounter for attention to other artificial openings of urinary tract: Secondary | ICD-10-CM | POA: Diagnosis not present

## 2019-06-01 DIAGNOSIS — Q6211 Congenital occlusion of ureteropelvic junction: Secondary | ICD-10-CM | POA: Diagnosis not present

## 2019-06-01 DIAGNOSIS — L97818 Non-pressure chronic ulcer of other part of right lower leg with other specified severity: Secondary | ICD-10-CM | POA: Diagnosis not present

## 2019-06-02 ENCOUNTER — Other Ambulatory Visit: Payer: Self-pay | Admitting: Cardiovascular Disease

## 2019-06-02 NOTE — Telephone Encounter (Signed)
Rx(s) sent to pharmacy electronically.  

## 2019-06-04 DIAGNOSIS — Q6211 Congenital occlusion of ureteropelvic junction: Secondary | ICD-10-CM | POA: Diagnosis not present

## 2019-06-04 DIAGNOSIS — L97818 Non-pressure chronic ulcer of other part of right lower leg with other specified severity: Secondary | ICD-10-CM | POA: Diagnosis not present

## 2019-06-04 DIAGNOSIS — Z436 Encounter for attention to other artificial openings of urinary tract: Secondary | ICD-10-CM | POA: Diagnosis not present

## 2019-06-04 DIAGNOSIS — I872 Venous insufficiency (chronic) (peripheral): Secondary | ICD-10-CM | POA: Diagnosis not present

## 2019-06-04 DIAGNOSIS — L97828 Non-pressure chronic ulcer of other part of left lower leg with other specified severity: Secondary | ICD-10-CM | POA: Diagnosis not present

## 2019-06-04 DIAGNOSIS — N179 Acute kidney failure, unspecified: Secondary | ICD-10-CM | POA: Diagnosis not present

## 2019-06-08 DIAGNOSIS — N179 Acute kidney failure, unspecified: Secondary | ICD-10-CM | POA: Diagnosis not present

## 2019-06-08 DIAGNOSIS — L97828 Non-pressure chronic ulcer of other part of left lower leg with other specified severity: Secondary | ICD-10-CM | POA: Diagnosis not present

## 2019-06-08 DIAGNOSIS — L97818 Non-pressure chronic ulcer of other part of right lower leg with other specified severity: Secondary | ICD-10-CM | POA: Diagnosis not present

## 2019-06-08 DIAGNOSIS — I872 Venous insufficiency (chronic) (peripheral): Secondary | ICD-10-CM | POA: Diagnosis not present

## 2019-06-08 DIAGNOSIS — Z436 Encounter for attention to other artificial openings of urinary tract: Secondary | ICD-10-CM | POA: Diagnosis not present

## 2019-06-08 DIAGNOSIS — Q6211 Congenital occlusion of ureteropelvic junction: Secondary | ICD-10-CM | POA: Diagnosis not present

## 2019-06-11 ENCOUNTER — Telehealth: Payer: Self-pay

## 2019-06-11 ENCOUNTER — Ambulatory Visit (INDEPENDENT_AMBULATORY_CARE_PROVIDER_SITE_OTHER): Payer: Medicare Other | Admitting: Internal Medicine

## 2019-06-11 ENCOUNTER — Other Ambulatory Visit: Payer: Self-pay

## 2019-06-11 ENCOUNTER — Encounter: Payer: Self-pay | Admitting: Internal Medicine

## 2019-06-11 DIAGNOSIS — I872 Venous insufficiency (chronic) (peripheral): Secondary | ICD-10-CM | POA: Diagnosis not present

## 2019-06-11 DIAGNOSIS — I878 Other specified disorders of veins: Secondary | ICD-10-CM | POA: Diagnosis not present

## 2019-06-11 DIAGNOSIS — L97828 Non-pressure chronic ulcer of other part of left lower leg with other specified severity: Secondary | ICD-10-CM | POA: Diagnosis not present

## 2019-06-11 DIAGNOSIS — R413 Other amnesia: Secondary | ICD-10-CM

## 2019-06-11 DIAGNOSIS — Z436 Encounter for attention to other artificial openings of urinary tract: Secondary | ICD-10-CM | POA: Diagnosis not present

## 2019-06-11 DIAGNOSIS — N179 Acute kidney failure, unspecified: Secondary | ICD-10-CM | POA: Diagnosis not present

## 2019-06-11 DIAGNOSIS — L97929 Non-pressure chronic ulcer of unspecified part of left lower leg with unspecified severity: Secondary | ICD-10-CM

## 2019-06-11 DIAGNOSIS — L97818 Non-pressure chronic ulcer of other part of right lower leg with other specified severity: Secondary | ICD-10-CM | POA: Diagnosis not present

## 2019-06-11 DIAGNOSIS — L97919 Non-pressure chronic ulcer of unspecified part of right lower leg with unspecified severity: Secondary | ICD-10-CM | POA: Diagnosis not present

## 2019-06-11 DIAGNOSIS — Q6211 Congenital occlusion of ureteropelvic junction: Secondary | ICD-10-CM | POA: Diagnosis not present

## 2019-06-11 MED ORDER — DOXYCYCLINE HYCLATE 100 MG PO TABS: 100.0000 mg | ORAL_TABLET | Freq: Two times a day (BID) | ORAL | 1 refills | Status: DC

## 2019-06-11 NOTE — Progress Notes (Signed)
   Subjective:    Patient ID: Donna Woods, female    DOB: [birth year], 85 y.o.   MRN: [id number]  HPI 85 year old Female with memory loss and hx venous stasis with chronic ulcers both lower extremities.  She has been treated at [facility] in the remote past for leg ulcers but apparently would have long wait times there.  More recently she has had [facility] through [place].  I received a call regarding the need for an antibiotic for the leg ulcers.  Patient does not have access to video capability.  We continued with audio only.  She is identified as Donna Woods, a patient in this practice using 2 identifiers.  Today she is accompanied by her nurse from [facility] who gives most of the history.  They are agreeable to visit in this format today due to the COVID-19 pandemic.  Her husband is also present.  He is adamant he does not want her to return to [facility]  Nurse tells me that patient has a tendency to pick at her leg wounds son more recently she has been wrapping the a.m. using TED hose and a silver alginate dressing.  She says there are 4 ulcers on each leg.  The deepest ulcer is on the right outer ankle area and is measuring 0.8 x 0.7 x 0.3 cm according to the nurse. The other ulcers are more superficial than this.  Her legs are red according to the nurse.  Nurse indicates that these lesions look as if they were becoming infected and she has cellulitis of her legs.  She is afebrile.    Review of Systems no documented fever or chills     Objective:   Physical Exam  See above      Assessment & Plan:  Infected leg ulcers both legs  Venous stasis  Memory loss  Plan: We agreed we will continue to try to treat her and I am using home health services.  She will take doxycycline 100 mg twice daily for 10 days with 1 refill.  Nurse will continue to dress these wounds as she has been doing previously.  We may need to reassess  situation in 7 to 10 days or sooner if worse.  Nurse reports that [facility] may be an option.  20 minutes spent with virtual/audio visit, reviewing records, medical decision making, disposition.

## 2019-06-11 NOTE — Telephone Encounter (Signed)
Arrange for virtual visit. Call nurse and Let nurse know she has had to go to [facility] wound care center in the past.

## 2019-06-11 NOTE — Patient Instructions (Signed)
Continue local dressing as previously described.  Doxycycline 100 mg twice daily for 10 days with 1 refill.

## 2019-06-11 NOTE — Telephone Encounter (Addendum)
Estill Bamberg nurse from [facility] called states patient's wound is red and has drainage no fever and no odor she said patient could benefit from an antibiotic. She noticed this on Tuesday. Call back number [phone].

## 2019-06-12 ENCOUNTER — Telehealth: Payer: Self-pay | Admitting: Internal Medicine

## 2019-06-12 NOTE — Telephone Encounter (Signed)
Donna Woods [phone]  Donna Woods called to say he [facility] to get [facility] an appointment since she is doing better and can now go, and they told him they would need new referral.

## 2019-06-12 NOTE — Telephone Encounter (Signed)
I cannot see where she was ever to go [facility]. Dr. Gwenlyn Found tested the circulation in her legs August 2019 and results were normal. I will call patient's daughter, Leigh Aurora who is a nurse and ask her to straighten this matter out. Hold off on referral until Donna Woods calls [place] back.

## 2019-06-15 DIAGNOSIS — Q6211 Congenital occlusion of ureteropelvic junction: Secondary | ICD-10-CM | POA: Diagnosis not present

## 2019-06-15 DIAGNOSIS — I872 Venous insufficiency (chronic) (peripheral): Secondary | ICD-10-CM | POA: Diagnosis not present

## 2019-06-15 DIAGNOSIS — L97828 Non-pressure chronic ulcer of other part of left lower leg with other specified severity: Secondary | ICD-10-CM | POA: Diagnosis not present

## 2019-06-15 DIAGNOSIS — N179 Acute kidney failure, unspecified: Secondary | ICD-10-CM | POA: Diagnosis not present

## 2019-06-15 DIAGNOSIS — L97818 Non-pressure chronic ulcer of other part of right lower leg with other specified severity: Secondary | ICD-10-CM | POA: Diagnosis not present

## 2019-06-15 DIAGNOSIS — Z436 Encounter for attention to other artificial openings of urinary tract: Secondary | ICD-10-CM | POA: Diagnosis not present

## 2019-06-15 NOTE — Telephone Encounter (Signed)
Donna Woods called back to say that her mother does not need referral to [facility], she may need referral to Wound Care in [facility]. Amanda with [facility] agency is going to check on this and call us back. The antibiotic did help with the redness. She is also going to talk with parents about [facility].

## 2019-06-18 DIAGNOSIS — L97828 Non-pressure chronic ulcer of other part of left lower leg with other specified severity: Secondary | ICD-10-CM | POA: Diagnosis not present

## 2019-06-18 DIAGNOSIS — Q6211 Congenital occlusion of ureteropelvic junction: Secondary | ICD-10-CM | POA: Diagnosis not present

## 2019-06-18 DIAGNOSIS — I872 Venous insufficiency (chronic) (peripheral): Secondary | ICD-10-CM | POA: Diagnosis not present

## 2019-06-18 DIAGNOSIS — Z436 Encounter for attention to other artificial openings of urinary tract: Secondary | ICD-10-CM | POA: Diagnosis not present

## 2019-06-18 DIAGNOSIS — L97818 Non-pressure chronic ulcer of other part of right lower leg with other specified severity: Secondary | ICD-10-CM | POA: Diagnosis not present

## 2019-06-18 DIAGNOSIS — N179 Acute kidney failure, unspecified: Secondary | ICD-10-CM | POA: Diagnosis not present

## 2019-06-20 DIAGNOSIS — I4891 Unspecified atrial fibrillation: Secondary | ICD-10-CM | POA: Diagnosis not present

## 2019-06-20 DIAGNOSIS — I119 Hypertensive heart disease without heart failure: Secondary | ICD-10-CM | POA: Diagnosis not present

## 2019-06-20 DIAGNOSIS — L97828 Non-pressure chronic ulcer of other part of left lower leg with other specified severity: Secondary | ICD-10-CM | POA: Diagnosis not present

## 2019-06-20 DIAGNOSIS — K4090 Unilateral inguinal hernia, without obstruction or gangrene, not specified as recurrent: Secondary | ICD-10-CM | POA: Diagnosis not present

## 2019-06-20 DIAGNOSIS — Z9181 History of falling: Secondary | ICD-10-CM | POA: Diagnosis not present

## 2019-06-20 DIAGNOSIS — I739 Peripheral vascular disease, unspecified: Secondary | ICD-10-CM | POA: Diagnosis not present

## 2019-06-20 DIAGNOSIS — M810 Age-related osteoporosis without current pathological fracture: Secondary | ICD-10-CM | POA: Diagnosis not present

## 2019-06-20 DIAGNOSIS — I872 Venous insufficiency (chronic) (peripheral): Secondary | ICD-10-CM | POA: Diagnosis not present

## 2019-06-20 DIAGNOSIS — N179 Acute kidney failure, unspecified: Secondary | ICD-10-CM | POA: Diagnosis not present

## 2019-06-20 DIAGNOSIS — Z96652 Presence of left artificial knee joint: Secondary | ICD-10-CM | POA: Diagnosis not present

## 2019-06-20 DIAGNOSIS — Z436 Encounter for attention to other artificial openings of urinary tract: Secondary | ICD-10-CM | POA: Diagnosis not present

## 2019-06-20 DIAGNOSIS — Q6211 Congenital occlusion of ureteropelvic junction: Secondary | ICD-10-CM | POA: Diagnosis not present

## 2019-06-20 DIAGNOSIS — Z87440 Personal history of urinary (tract) infections: Secondary | ICD-10-CM | POA: Diagnosis not present

## 2019-06-20 DIAGNOSIS — R001 Bradycardia, unspecified: Secondary | ICD-10-CM | POA: Diagnosis not present

## 2019-06-20 DIAGNOSIS — L97818 Non-pressure chronic ulcer of other part of right lower leg with other specified severity: Secondary | ICD-10-CM | POA: Diagnosis not present

## 2019-06-20 DIAGNOSIS — N281 Cyst of kidney, acquired: Secondary | ICD-10-CM | POA: Diagnosis not present

## 2019-06-20 DIAGNOSIS — K219 Gastro-esophageal reflux disease without esophagitis: Secondary | ICD-10-CM | POA: Diagnosis not present

## 2019-06-20 DIAGNOSIS — F0390 Unspecified dementia without behavioral disturbance: Secondary | ICD-10-CM | POA: Diagnosis not present

## 2019-06-20 DIAGNOSIS — D72825 Bandemia: Secondary | ICD-10-CM | POA: Diagnosis not present

## 2019-06-20 DIAGNOSIS — D509 Iron deficiency anemia, unspecified: Secondary | ICD-10-CM | POA: Diagnosis not present

## 2019-06-22 DIAGNOSIS — Q6211 Congenital occlusion of ureteropelvic junction: Secondary | ICD-10-CM | POA: Diagnosis not present

## 2019-06-22 DIAGNOSIS — L97818 Non-pressure chronic ulcer of other part of right lower leg with other specified severity: Secondary | ICD-10-CM | POA: Diagnosis not present

## 2019-06-22 DIAGNOSIS — N179 Acute kidney failure, unspecified: Secondary | ICD-10-CM | POA: Diagnosis not present

## 2019-06-22 DIAGNOSIS — Z436 Encounter for attention to other artificial openings of urinary tract: Secondary | ICD-10-CM | POA: Diagnosis not present

## 2019-06-22 DIAGNOSIS — I872 Venous insufficiency (chronic) (peripheral): Secondary | ICD-10-CM | POA: Diagnosis not present

## 2019-06-22 DIAGNOSIS — L97828 Non-pressure chronic ulcer of other part of left lower leg with other specified severity: Secondary | ICD-10-CM | POA: Diagnosis not present

## 2019-06-23 ENCOUNTER — Other Ambulatory Visit: Payer: Self-pay

## 2019-06-23 ENCOUNTER — Other Ambulatory Visit: Payer: Medicare Other | Admitting: Internal Medicine

## 2019-06-23 DIAGNOSIS — R413 Other amnesia: Secondary | ICD-10-CM

## 2019-06-23 DIAGNOSIS — R7302 Impaired glucose tolerance (oral): Secondary | ICD-10-CM

## 2019-06-23 DIAGNOSIS — M810 Age-related osteoporosis without current pathological fracture: Secondary | ICD-10-CM

## 2019-06-23 DIAGNOSIS — I10 Essential (primary) hypertension: Secondary | ICD-10-CM | POA: Diagnosis not present

## 2019-06-23 DIAGNOSIS — Z0000 Encounter for general adult medical examination without abnormal findings: Secondary | ICD-10-CM | POA: Diagnosis not present

## 2019-06-23 DIAGNOSIS — E782 Mixed hyperlipidemia: Secondary | ICD-10-CM | POA: Diagnosis not present

## 2019-06-24 DIAGNOSIS — L97919 Non-pressure chronic ulcer of unspecified part of right lower leg with unspecified severity: Secondary | ICD-10-CM | POA: Diagnosis not present

## 2019-06-24 DIAGNOSIS — L97929 Non-pressure chronic ulcer of unspecified part of left lower leg with unspecified severity: Secondary | ICD-10-CM | POA: Diagnosis not present

## 2019-06-24 LAB — COMPLETE METABOLIC PANEL WITH GFR
AG Ratio: 1.4 (calc) (ref 1.0–2.5)
ALT: 10 U/L (ref 6–29)
AST: 8 U/L — ABNORMAL LOW (ref 10–35)
Albumin: 4.2 g/dL (ref 3.6–5.1)
Alkaline phosphatase (APISO): 63 U/L (ref 37–153)
BUN/Creatinine Ratio: 33 (calc) — ABNORMAL HIGH (ref 6–22)
BUN: 29 mg/dL — ABNORMAL HIGH (ref 7–25)
CO2: 23 mmol/L (ref 20–32)
Calcium: 9.9 mg/dL (ref 8.6–10.4)
Chloride: 108 mmol/L (ref 98–110)
Creat: 0.89 mg/dL — ABNORMAL HIGH (ref 0.60–0.88)
GFR, Est African American: 70 mL/min/{1.73_m2} (ref >=60)
GFR, Est Non African American: 60 mL/min/{1.73_m2} (ref >=60)
Globulin: 2.9 g/dL (calc) (ref 1.9–3.7)
Glucose, Bld: 98 mg/dL (ref 65–99)
Potassium: 4.6 mmol/L (ref 3.5–5.3)
Sodium: 142 mmol/L (ref 135–146)
Total Bilirubin: 0.6 mg/dL (ref 0.2–1.2)
Total Protein: 7.1 g/dL (ref 6.1–8.1)

## 2019-06-24 LAB — CBC WITH DIFFERENTIAL/PLATELET
Absolute Monocytes: 600 cells/uL (ref 200–950)
Basophils Absolute: 79 cells/uL (ref 0–200)
Basophils Relative: 1 %
Eosinophils Absolute: 213 cells/uL (ref 15–500)
Eosinophils Relative: 2.7 %
HCT: 33.2 % — ABNORMAL LOW (ref 35.0–45.0)
Hemoglobin: 11.5 g/dL — ABNORMAL LOW (ref 11.7–15.5)
Lymphs Abs: 1864 cells/uL (ref 850–3900)
MCH: 32.4 pg (ref 27.0–33.0)
MCHC: 34.6 g/dL (ref 32.0–36.0)
MCV: 93.5 fL (ref 80.0–100.0)
MPV: 10.9 fL (ref 7.5–12.5)
Monocytes Relative: 7.6 %
Neutro Abs: 5143 cells/uL (ref 1500–7800)
Neutrophils Relative %: 65.1 %
Platelets: 221 10*3/uL (ref 140–400)
RBC: 3.55 10*6/uL — ABNORMAL LOW (ref 3.80–5.10)
RDW: 12.5 % (ref 11.0–15.0)
Total Lymphocyte: 23.6 %
WBC: 7.9 10*3/uL (ref 3.8–10.8)

## 2019-06-24 LAB — HEMOGLOBIN A1C
Hgb A1c MFr Bld: 5.6 % of total Hgb (ref <5.7)
Mean Plasma Glucose: 114 (calc)
eAG (mmol/L): 6.3 (calc)

## 2019-06-24 LAB — TSH: TSH: 2.44 mIU/L (ref 0.40–4.50)

## 2019-06-24 LAB — LIPID PANEL
Cholesterol: 159 mg/dL (ref <200)
HDL: 58 mg/dL (ref >=50)
LDL Cholesterol (Calc): 84 mg/dL (calc)
Non-HDL Cholesterol (Calc): 101 mg/dL (calc) (ref <130)
Total CHOL/HDL Ratio: 2.7 (calc) (ref <5.0)
Triglycerides: 84 mg/dL (ref <150)

## 2019-06-25 DIAGNOSIS — Z436 Encounter for attention to other artificial openings of urinary tract: Secondary | ICD-10-CM | POA: Diagnosis not present

## 2019-06-25 DIAGNOSIS — L97828 Non-pressure chronic ulcer of other part of left lower leg with other specified severity: Secondary | ICD-10-CM | POA: Diagnosis not present

## 2019-06-25 DIAGNOSIS — I872 Venous insufficiency (chronic) (peripheral): Secondary | ICD-10-CM | POA: Diagnosis not present

## 2019-06-25 DIAGNOSIS — N179 Acute kidney failure, unspecified: Secondary | ICD-10-CM | POA: Diagnosis not present

## 2019-06-25 DIAGNOSIS — L97818 Non-pressure chronic ulcer of other part of right lower leg with other specified severity: Secondary | ICD-10-CM | POA: Diagnosis not present

## 2019-06-25 DIAGNOSIS — Q6211 Congenital occlusion of ureteropelvic junction: Secondary | ICD-10-CM | POA: Diagnosis not present

## 2019-06-26 ENCOUNTER — Ambulatory Visit (INDEPENDENT_AMBULATORY_CARE_PROVIDER_SITE_OTHER): Payer: Medicare Other | Admitting: Internal Medicine

## 2019-06-26 ENCOUNTER — Other Ambulatory Visit: Payer: Self-pay

## 2019-06-26 ENCOUNTER — Encounter: Payer: Self-pay | Admitting: Internal Medicine

## 2019-06-26 VITALS — BP 110/60 | HR 78 | Temp 98.0°F | Ht 66.0 in | Wt 142.0 lb

## 2019-06-26 DIAGNOSIS — L97929 Non-pressure chronic ulcer of unspecified part of left lower leg with unspecified severity: Secondary | ICD-10-CM

## 2019-06-26 DIAGNOSIS — Z0000 Encounter for general adult medical examination without abnormal findings: Secondary | ICD-10-CM | POA: Diagnosis not present

## 2019-06-26 DIAGNOSIS — E782 Mixed hyperlipidemia: Secondary | ICD-10-CM | POA: Diagnosis not present

## 2019-06-26 DIAGNOSIS — R8290 Unspecified abnormal findings in urine: Secondary | ICD-10-CM

## 2019-06-26 DIAGNOSIS — I10 Essential (primary) hypertension: Secondary | ICD-10-CM

## 2019-06-26 DIAGNOSIS — L97919 Non-pressure chronic ulcer of unspecified part of right lower leg with unspecified severity: Secondary | ICD-10-CM

## 2019-06-26 DIAGNOSIS — N390 Urinary tract infection, site not specified: Secondary | ICD-10-CM | POA: Diagnosis not present

## 2019-06-26 DIAGNOSIS — B9629 Other Escherichia coli [E. coli] as the cause of diseases classified elsewhere: Secondary | ICD-10-CM

## 2019-06-26 DIAGNOSIS — I493 Ventricular premature depolarization: Secondary | ICD-10-CM | POA: Diagnosis not present

## 2019-06-26 DIAGNOSIS — R413 Other amnesia: Secondary | ICD-10-CM | POA: Diagnosis not present

## 2019-06-26 DIAGNOSIS — Z1612 Extended spectrum beta lactamase (ESBL) resistance: Secondary | ICD-10-CM | POA: Diagnosis not present

## 2019-06-26 LAB — POCT URINALYSIS DIPSTICK
Bilirubin, UA: NEGATIVE
Glucose, UA: NEGATIVE
Ketones, UA: NEGATIVE
Nitrite, UA: NEGATIVE
Protein, UA: POSITIVE — AB
Spec Grav, UA: 1.015 (ref 1.010–1.025)
Urobilinogen, UA: 0.2 E.U./dL
pH, UA: 6 (ref 5.0–8.0)

## 2019-06-26 MED ORDER — OLANZAPINE 5 MG PO TABS: 5.0000 mg | ORAL_TABLET | Freq: Every day | ORAL | 2 refills | Status: DC

## 2019-06-26 NOTE — Progress Notes (Signed)
Subjective:    Patient ID: Donna Woods, female    DOB: 1934/10/30, 85 y.o.   MRN: [id number]  HPI 85 year old Female for health maintenance exam, Medicare wellness and evaluation of medical issues.  Accompanied by her daughter who is a Equities trader.  Patient picks at her legs at night and that has created leg wounds and cellulitis.  She has home health nurse seeing her at least weekly to dress the wounds.  She recently has been on doxycycline.  They need to go to wound care center.  Daughter will check with home health nurse to see what the plans are.  Daughter asking if beta-blocker should be stopped.  Says sometimes heart rate is in the 30s but today her pulse is 78.  Will be seeing cardiologist soon  I think she is up some at night and has a tendency to pick at her legs.  We are going to try Zyprexa 5 mg at bedtime to see if that will help  Memory loss dates back to December 2010 when she was hospitalized with a meningitis type process.  She presented with a headache.  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  It was thought she had a viral syndrome.  She was discharged from the hospital but her condition worsened and she returned with increasing confusion headache and meningismus.  She was placed on antibiotics after which she improved.  She was taken off antibiotics and worsen within 48 hours.  MRI showed a proteinaceous exudate on the surface of her brain right side greater than left side.  She had an infectious disease consultation.  Antibiotic coverage was continued.  She is slightly improved and went to rehab facility.  Short-term memory remains a problem.  Sometimes she has a tendency to fall backwards.  She and her husband reside alone and is becoming increasingly difficult for him to take care of her.  She gets confused.  Sometimes she is up at night.  She is a former Radio producer.  Non-smoker.  No alcohol consumption.  2 adult daughters.  Husband is  supportive but his health is not as good as it used to be.  She has GE reflux, osteoporosis, vitamin D deficiency, chronic venous stasis and stasis dermatitis.  History of hypertension.  Had left inguinal hernia repair by Dr. Mia Creek in [place] in 2013.  History of knee replacement 2005.  Right knee replacement with Dr. Durward Fortes January 2017.  Total hysterectomy approximately 1968.  Cholecystectomy 1969.  History of seasonal allergies arthritis and palpitations.  Is maintained on beta-blocker and sometimes her heart rate is low.  Family history: Father died at age 60 of respiratory failure.  Mother died at age 33 of neurological disease.  One brother with history of CMT disease.  Another brother in good health.  Sister in good health.  Daughter with history of hypertension and glucose intolerance.  She is alert and cooperative.  Not oriented to day of week or month.  She was hospitalized at [facility] in [place] in late January with septic shock and was transferred to ICU at [facility] in [place].  History of right-sided congenital UPJ obstruction and had acute syncope while at beauty salon.  Was hypotensive at 70 mmHg.  She had white blood cell count of 39,000.  Was treated with vancomycin and ceftriaxone and acyclovir.  A right IJ central venous catheter was placed.  MRI of the brain showed no acute CVA or bleed.  She had bilateral interstitial  infiltrates on chest x-ray.  Procalcitonin was greater than 100.  A right percutaneous nephrostomy tube was placed emergently due to severe right hydronephrosis.  In May she had cystoscopy with right retrograde pyelogram, right ureteral balloon dilatation of UPJ stricture and right ureteral stent placement by Dr. Gloriann Loan, urologist here in [place]. Stent removed in July   Review of Systems no new complaints-issues with memory loss .  Patient's husband does not want to move to a retirement center.  Daughter would like for them to move to a  retirement center.        Objective:   Physical Exam Blood pressure 110/60 pulse 78 temperature 98 degrees orally pulse oximetry 97% weight 142 pounds.  BMI 22.92  Skin warm and dry.  I have not looked at her leg ulcers today as they are wrapped home health nurse.  Daughter indicates they are improving.  Skin warm and dry.  Nodes none.  Neck is supple without JVD thyromegaly or carotid bruits.  Chest clear.  She is bradycardic.  Regular rate and rhythm.  Abdomen no hepatosplenomegaly masses or tenderness.  Bimanual deferred.  No lower extremity edema appreciated.  Neuro she is disoriented to day of week month and year.  She is oriented to self.  She is cooperative.       Assessment & Plan:  Memory loss dating back to episode of meningitis many years ago and now appears to have dementia.  Has had TSH B12 and folate levels checked.  She is on Aricept  Bradycardia secondary to beta-blocker which has been used to control PVCs and palpitations.  Dr. Alvester Chou usually sees her once a year.  History of impaired glucose tolerance- Hgb AIC stable at 5.6%  Allergic rhinitis  Osteoporosis   GE reflux  Chronic venous stasis and stasis dermatitis resulting in ulcerations which are aggravated by her picking at them.  Home health nurse comes and wraps these and does the best she can with them which I think she is done an excellent job overall.  Essential hypertension-stable  ESBL E.coli UTI treated with Macrobid. Has follow up with Dr. Gloriann Loan in August. Stent was removed in July.  Plan: Return in 1 year or as needed.  Subjective:   Patient presents for Medicare Annual/Subsequent preventive examination.  Review Past Medical/Family/Social: See above   Risk Factors  Current exercise habits: Sedentary Dietary issues discussed: Not discussed  Cardiac risk factors: Hyperlipidemia  Depression Screen  (Note: if answer to either of the following is "Yes", a more complete depression screening is  indicated)   Over the past two weeks, have you felt down, depressed or hopeless? No  Over the past two weeks, have you felt little interest or pleasure in doing things? No Have you lost interest or pleasure in daily life? No Do you often feel hopeless? No Do you cry easily over simple problems? No   Activities of Daily Living  In your present state of health, do you have any difficulty performing the following activities?:   Driving? No  Managing money? No  Feeding yourself? No  Getting from bed to chair? No  Climbing a flight of stairs? No  Preparing food and eating?: No  Bathing or showering? No  Getting dressed: No  Getting to the toilet? No  Using the toilet:No  Moving around from place to place: No  In the past year have you fallen or had a near fall?:No  Are you sexually active? No  Do you have more than  one partner? No   Hearing Difficulties:  Do you often ask people to speak up or repeat themselves? No  Do you experience ringing or noises in your ears?  Yes Do you have difficulty understanding soft or whispered voices?  Sometimes Do you feel that you have a problem with memory? yes Do you often misplace items?  Sometimes   Home Safety:  Do you have a smoke alarm at your residence? Yes Do you have grab bars in the bathroom? Do you have throw rugs in your house?  Yes   Cognitive Testing  Alert? Yes Normal Appearance?Yes  Oriented to person? Yes Place? Yes  Time?  No Recall of three objects?  None Can perform simple calculations?  Not tested Displays appropriate judgment?Yes  Can read the correct time from a watch face?Yes   List the Names of Other Physician/Practitioners you currently use:  See referral list for the physicians patient is currently seeing.  Dr. Gwenlyn Found   Review of Systems: See above   Objective:     General appearance: Appears stated age and thin Head: Normocephalic, without obvious abnormality, atraumatic  Eyes: conj clear, EOMi PEERLA   Ears: normal TM's and external ear canals both ears  Nose: Nares normal. Septum midline. Mucosa normal. No drainage or sinus tenderness.  Throat: lips, mucosa, and tongue normal; teeth and gums normal  Neck: no adenopathy, no carotid bruit, no JVD, supple, symmetrical, trachea midline and thyroid not enlarged, symmetric, no tenderness/mass/nodules  No CVA tenderness.  Lungs: clear to auscultation bilaterally  Breasts: normal appearance, no masses or tenderness Heart: regular rate and rhythm, S1, S2 normal, no murmur, click, rub or gallop  Abdomen: soft, non-tender; bowel sounds normal; no masses, no organomegaly  Musculoskeletal: ROM normal in all joints, no crepitus, no deformity, Normal muscle strengthen. Back  is symmetric, no curvature. Skin: Skin color, texture, turgor normal. No rashes or lesions  Lymph nodes: Cervical, supraclavicular, and axillary nodes normal.  Neurologic: CN 2 -12 Normal, Normal symmetric reflexes. Normal coordination and gait  Psych: Alert, Mood appear stable.    Assessment:    Annual wellness medicare exam   Plan:    During the course of the visit the patient was educated and counseled about appropriate screening and preventive services including:   Recommend annual flu vaccine     Patient Instructions (the written plan) was given to the patient.  Medicare Attestation  I have personally reviewed:  The patient's medical and social history  Their use of alcohol, tobacco or illicit drugs  Their current medications and supplements  The patient's functional ability including ADLs,fall risks, home safety risks, cognitive, and hearing and visual impairment  Diet and physical activities  Evidence for depression or mood disorders  The patient's weight, height, BMI, and visual acuity have been recorded in the chart. I have made referrals, counseling, and provided education to the patient based on review of the above and I have provided the patient with a  written personalized care plan for preventive services.

## 2019-06-28 LAB — URINE CULTURE
MICRO NUMBER:: 725020
SPECIMEN QUALITY:: ADEQUATE

## 2019-06-29 ENCOUNTER — Other Ambulatory Visit: Payer: Self-pay

## 2019-06-29 DIAGNOSIS — N390 Urinary tract infection, site not specified: Secondary | ICD-10-CM

## 2019-06-29 MED ORDER — NITROFURANTOIN MONOHYD MACRO 100 MG PO CAPS: 100.0000 mg | ORAL_CAPSULE | Freq: Two times a day (BID) | ORAL | 0 refills | Status: DC

## 2019-06-30 DIAGNOSIS — I872 Venous insufficiency (chronic) (peripheral): Secondary | ICD-10-CM | POA: Diagnosis not present

## 2019-06-30 DIAGNOSIS — L97822 Non-pressure chronic ulcer of other part of left lower leg with fat layer exposed: Secondary | ICD-10-CM | POA: Diagnosis not present

## 2019-06-30 DIAGNOSIS — L97812 Non-pressure chronic ulcer of other part of right lower leg with fat layer exposed: Secondary | ICD-10-CM | POA: Diagnosis not present

## 2019-07-03 DIAGNOSIS — I872 Venous insufficiency (chronic) (peripheral): Secondary | ICD-10-CM | POA: Diagnosis not present

## 2019-07-03 DIAGNOSIS — Q6211 Congenital occlusion of ureteropelvic junction: Secondary | ICD-10-CM | POA: Diagnosis not present

## 2019-07-03 DIAGNOSIS — N179 Acute kidney failure, unspecified: Secondary | ICD-10-CM | POA: Diagnosis not present

## 2019-07-03 DIAGNOSIS — Z436 Encounter for attention to other artificial openings of urinary tract: Secondary | ICD-10-CM | POA: Diagnosis not present

## 2019-07-03 DIAGNOSIS — L97818 Non-pressure chronic ulcer of other part of right lower leg with other specified severity: Secondary | ICD-10-CM | POA: Diagnosis not present

## 2019-07-03 DIAGNOSIS — L97828 Non-pressure chronic ulcer of other part of left lower leg with other specified severity: Secondary | ICD-10-CM | POA: Diagnosis not present

## 2019-07-06 DIAGNOSIS — N179 Acute kidney failure, unspecified: Secondary | ICD-10-CM | POA: Diagnosis not present

## 2019-07-06 DIAGNOSIS — L97828 Non-pressure chronic ulcer of other part of left lower leg with other specified severity: Secondary | ICD-10-CM | POA: Diagnosis not present

## 2019-07-06 DIAGNOSIS — Q6211 Congenital occlusion of ureteropelvic junction: Secondary | ICD-10-CM | POA: Diagnosis not present

## 2019-07-06 DIAGNOSIS — Z436 Encounter for attention to other artificial openings of urinary tract: Secondary | ICD-10-CM | POA: Diagnosis not present

## 2019-07-06 DIAGNOSIS — L97818 Non-pressure chronic ulcer of other part of right lower leg with other specified severity: Secondary | ICD-10-CM | POA: Diagnosis not present

## 2019-07-06 DIAGNOSIS — I872 Venous insufficiency (chronic) (peripheral): Secondary | ICD-10-CM | POA: Diagnosis not present

## 2019-07-09 DIAGNOSIS — N135 Crossing vessel and stricture of ureter without hydronephrosis: Secondary | ICD-10-CM | POA: Diagnosis not present

## 2019-07-09 DIAGNOSIS — N130 Hydronephrosis with ureteropelvic junction obstruction: Secondary | ICD-10-CM | POA: Diagnosis not present

## 2019-07-10 DIAGNOSIS — Z436 Encounter for attention to other artificial openings of urinary tract: Secondary | ICD-10-CM | POA: Diagnosis not present

## 2019-07-10 DIAGNOSIS — L97828 Non-pressure chronic ulcer of other part of left lower leg with other specified severity: Secondary | ICD-10-CM | POA: Diagnosis not present

## 2019-07-10 DIAGNOSIS — N179 Acute kidney failure, unspecified: Secondary | ICD-10-CM | POA: Diagnosis not present

## 2019-07-10 DIAGNOSIS — I872 Venous insufficiency (chronic) (peripheral): Secondary | ICD-10-CM | POA: Diagnosis not present

## 2019-07-10 DIAGNOSIS — L97818 Non-pressure chronic ulcer of other part of right lower leg with other specified severity: Secondary | ICD-10-CM | POA: Diagnosis not present

## 2019-07-10 DIAGNOSIS — Q6211 Congenital occlusion of ureteropelvic junction: Secondary | ICD-10-CM | POA: Diagnosis not present

## 2019-07-14 ENCOUNTER — Ambulatory Visit (INDEPENDENT_AMBULATORY_CARE_PROVIDER_SITE_OTHER): Payer: Medicare Other | Admitting: Cardiovascular Disease

## 2019-07-14 ENCOUNTER — Encounter: Payer: Self-pay | Admitting: Cardiovascular Disease

## 2019-07-14 ENCOUNTER — Other Ambulatory Visit: Payer: Self-pay

## 2019-07-14 DIAGNOSIS — R609 Edema, unspecified: Secondary | ICD-10-CM | POA: Diagnosis not present

## 2019-07-14 DIAGNOSIS — L97822 Non-pressure chronic ulcer of other part of left lower leg with fat layer exposed: Secondary | ICD-10-CM | POA: Diagnosis not present

## 2019-07-14 DIAGNOSIS — I83028 Varicose veins of left lower extremity with ulcer other part of lower leg: Secondary | ICD-10-CM | POA: Diagnosis not present

## 2019-07-14 DIAGNOSIS — L97812 Non-pressure chronic ulcer of other part of right lower leg with fat layer exposed: Secondary | ICD-10-CM | POA: Diagnosis not present

## 2019-07-14 DIAGNOSIS — I872 Venous insufficiency (chronic) (peripheral): Secondary | ICD-10-CM | POA: Diagnosis not present

## 2019-07-14 DIAGNOSIS — I83018 Varicose veins of right lower extremity with ulcer other part of lower leg: Secondary | ICD-10-CM | POA: Diagnosis not present

## 2019-07-14 DIAGNOSIS — I10 Essential (primary) hypertension: Secondary | ICD-10-CM

## 2019-07-14 NOTE — Patient Instructions (Addendum)

## 2019-07-14 NOTE — Assessment & Plan Note (Signed)
History of essential hypertension with blood pressure measured today at 140/76 on low-dose atenolol.

## 2019-07-14 NOTE — Progress Notes (Signed)
07/14/2019 Donna Woods   1934/10/25  [id number]  Primary Physician Baxley, Cresenciano Lick, MD Primary Cardiologist: Lorretta Harp MD [name], [place]  HPI:  Donna Woods is a 85 y.o.  85 y.o.  thin appearing, married, Caucasian female mother of 2, grandmother to 6 grandchildren who was formerly a patient of Dr. Janene Madeira remotely. I last saw her in the office 09/26/2018.  She is accompanied by her daughter Levada Dy today..She does have progressive dementia as well.. She has a history of palpitations related to asymptomatic PVCs, hypertension. She denies chest pain or shortness of breath. Her last stress test performed 5 years ago was nonischemic. She did have a poorly defined neurologic illness which affected her memory and spanned many months requiring prolonged hospitalization and antibiotic therapy which she has for the most part recovered from.saw her a year ago she is asymptomatic. She denies chest pain shortness of breath dizziness or presyncope.she was referred back for preoperative clearance before elective left total knee replacement by Dr. Durward Fortes.She had a Myoview stress test performed 10/19/15 which was low risk patient underwent total knee replacement without complication. Since I saw her a year ago she has remained asymptomatic.She is chronically bradycardic with heart rates in the 40s on low-dose beta blocker is asymptomatic from this.  Since I saw her  in the office in November 2019 she has done well from a heart point of view.  Her venous stasis ulcers are stable and are wrapped today.  She did have an episode of urosepsis back in April with hydronephrosis requiring nephrostomy tube and ureteral stenting.   Current Meds  Medication Sig  . acetaminophen (TYLENOL) 500 MG tablet Take 500 mg by mouth daily.  Marland Kitchen atenolol (TENORMIN) 25 MG tablet Take 0.5 tablets (12.5 mg total) by mouth daily.  . cetirizine (ZYRTEC) 10 MG tablet Take 10 mg by mouth daily.  .  cholecalciferol (VITAMIN D) 25 MCG (1000 UT) tablet Take 1,000 Units by mouth daily.  Marland Kitchen lidocaine (LIDODERM) 5 % APPLY 1 TO 3 PATCHES FOR 12 HOURS OUT OF 24 HOURS AS DIRECTED (Patient taking differently: Place 1-3 patches onto the skin daily as needed (pain.). )  . Multiple Vitamin (MULTIVITAMIN WITH MINERALS) TABS tablet Take 1 tablet by mouth daily. Geritol Tablets  . OLANZapine (ZYPREXA) 5 MG tablet Take 1 tablet (5 mg total) by mouth at bedtime.     Allergies  Allergen Reactions  . Benicar [Olmesartan] Other (See Comments)    Unknown reaction type  . Penicillins Rash    Has patient had a PCN reaction causing immediate rash, facial/tongue/throat swelling, SOB or lightheadedness with hypotension: Yes Has patient had a PCN reaction causing severe rash involving mucus membranes or skin necrosis: No Has patient had a PCN reaction that required hospitalization No Has patient had a PCN reaction occurring within the last 10 years: No If all of the above answers are "NO", then may proceed with Cephalosporin use.   . Tape Other (See Comments)    Causes blisters    Social History   Socioeconomic History  . Marital status: Married    Spouse name: Not on file  . Number of children: Not on file  . Years of education: Not on file  . Highest education level: Not on file  Occupational History  . Not on file  Social Needs  . Financial resource strain: Not on file  . Food insecurity    Worry: Not on file  Inability: Not on file  . Transportation needs    Medical: Not on file    Non-medical: Not on file  Tobacco Use  . Smoking status: Never Smoker  . Smokeless tobacco: Never Used  Substance and Sexual Activity  . Alcohol use: No  . Drug use: No  . Sexual activity: Never  Lifestyle  . Physical activity    Days per week: Not on file    Minutes per session: Not on file  . Stress: Not on file  Relationships  . Social Herbalist on phone: Not on file    Gets together: Not  on file    Attends religious service: Not on file    Active member of club or organization: Not on file    Attends meetings of clubs or organizations: Not on file    Relationship status: Not on file  . Intimate partner violence    Fear of current or ex partner: Not on file    Emotionally abused: Not on file    Physically abused: Not on file    Forced sexual activity: Not on file  Other Topics Concern  . Not on file  Social History Narrative  . Not on file     Review of Systems: General: negative for chills, fever, night sweats or weight changes.  Cardiovascular: negative for chest pain, dyspnea on exertion, edema, orthopnea, palpitations, paroxysmal nocturnal dyspnea or shortness of breath Dermatological: negative for rash Respiratory: negative for cough or wheezing Urologic: negative for hematuria Abdominal: negative for nausea, vomiting, diarrhea, bright red blood per rectum, melena, or hematemesis Neurologic: negative for visual changes, syncope, or dizziness All other systems reviewed and are otherwise negative except as noted above.    Blood pressure 140/76, pulse (!) 49, height 5' 5.5" (1.664 m), weight 144 lb 12.8 oz (65.7 kg), SpO2 98 %.  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 No rashes or lesions Neurologic: Alert and oriented X 3, normal strength and tone. Normal symmetric reflexes. Normal coordination and gait  EKG sinus bradycardia at 49 with a nonspecific  IVCD.  I personally reviewed this EKG.  ASSESSMENT AND PLAN:   Hypertension History of essential hypertension with blood pressure measured today at 140/76 on low-dose atenolol.      Lorretta Harp MD FACP,FACC,FAHA, [facility] 07/14/2019 11:57 AM

## 2019-07-17 DIAGNOSIS — I872 Venous insufficiency (chronic) (peripheral): Secondary | ICD-10-CM | POA: Diagnosis not present

## 2019-07-17 DIAGNOSIS — N179 Acute kidney failure, unspecified: Secondary | ICD-10-CM | POA: Diagnosis not present

## 2019-07-17 DIAGNOSIS — Z436 Encounter for attention to other artificial openings of urinary tract: Secondary | ICD-10-CM | POA: Diagnosis not present

## 2019-07-17 DIAGNOSIS — Q6211 Congenital occlusion of ureteropelvic junction: Secondary | ICD-10-CM | POA: Diagnosis not present

## 2019-07-17 DIAGNOSIS — L97828 Non-pressure chronic ulcer of other part of left lower leg with other specified severity: Secondary | ICD-10-CM | POA: Diagnosis not present

## 2019-07-17 DIAGNOSIS — L97818 Non-pressure chronic ulcer of other part of right lower leg with other specified severity: Secondary | ICD-10-CM | POA: Diagnosis not present

## 2019-07-20 DIAGNOSIS — Z4800 Encounter for change or removal of nonsurgical wound dressing: Secondary | ICD-10-CM | POA: Diagnosis not present

## 2019-07-20 DIAGNOSIS — Z87440 Personal history of urinary (tract) infections: Secondary | ICD-10-CM | POA: Diagnosis not present

## 2019-07-20 DIAGNOSIS — N281 Cyst of kidney, acquired: Secondary | ICD-10-CM | POA: Diagnosis not present

## 2019-07-20 DIAGNOSIS — D72825 Bandemia: Secondary | ICD-10-CM | POA: Diagnosis not present

## 2019-07-20 DIAGNOSIS — R001 Bradycardia, unspecified: Secondary | ICD-10-CM | POA: Diagnosis not present

## 2019-07-20 DIAGNOSIS — S81801D Unspecified open wound, right lower leg, subsequent encounter: Secondary | ICD-10-CM | POA: Diagnosis not present

## 2019-07-20 DIAGNOSIS — Z436 Encounter for attention to other artificial openings of urinary tract: Secondary | ICD-10-CM | POA: Diagnosis not present

## 2019-07-20 DIAGNOSIS — D509 Iron deficiency anemia, unspecified: Secondary | ICD-10-CM | POA: Diagnosis not present

## 2019-07-20 DIAGNOSIS — I119 Hypertensive heart disease without heart failure: Secondary | ICD-10-CM | POA: Diagnosis not present

## 2019-07-20 DIAGNOSIS — K219 Gastro-esophageal reflux disease without esophagitis: Secondary | ICD-10-CM | POA: Diagnosis not present

## 2019-07-20 DIAGNOSIS — M810 Age-related osteoporosis without current pathological fracture: Secondary | ICD-10-CM | POA: Diagnosis not present

## 2019-07-20 DIAGNOSIS — I872 Venous insufficiency (chronic) (peripheral): Secondary | ICD-10-CM | POA: Diagnosis not present

## 2019-07-20 DIAGNOSIS — S81802D Unspecified open wound, left lower leg, subsequent encounter: Secondary | ICD-10-CM | POA: Diagnosis not present

## 2019-07-20 DIAGNOSIS — I739 Peripheral vascular disease, unspecified: Secondary | ICD-10-CM | POA: Diagnosis not present

## 2019-07-20 DIAGNOSIS — F0390 Unspecified dementia without behavioral disturbance: Secondary | ICD-10-CM | POA: Diagnosis not present

## 2019-07-20 DIAGNOSIS — K4090 Unilateral inguinal hernia, without obstruction or gangrene, not specified as recurrent: Secondary | ICD-10-CM | POA: Diagnosis not present

## 2019-07-20 DIAGNOSIS — L97818 Non-pressure chronic ulcer of other part of right lower leg with other specified severity: Secondary | ICD-10-CM | POA: Diagnosis not present

## 2019-07-20 DIAGNOSIS — I4891 Unspecified atrial fibrillation: Secondary | ICD-10-CM | POA: Diagnosis not present

## 2019-07-20 DIAGNOSIS — Z9181 History of falling: Secondary | ICD-10-CM | POA: Diagnosis not present

## 2019-07-20 DIAGNOSIS — Q6211 Congenital occlusion of ureteropelvic junction: Secondary | ICD-10-CM | POA: Diagnosis not present

## 2019-07-20 DIAGNOSIS — Z96652 Presence of left artificial knee joint: Secondary | ICD-10-CM | POA: Diagnosis not present

## 2019-07-20 DIAGNOSIS — N179 Acute kidney failure, unspecified: Secondary | ICD-10-CM | POA: Diagnosis not present

## 2019-07-20 DIAGNOSIS — L97828 Non-pressure chronic ulcer of other part of left lower leg with other specified severity: Secondary | ICD-10-CM | POA: Diagnosis not present

## 2019-07-21 DIAGNOSIS — L97818 Non-pressure chronic ulcer of other part of right lower leg with other specified severity: Secondary | ICD-10-CM | POA: Diagnosis not present

## 2019-07-21 DIAGNOSIS — S81802D Unspecified open wound, left lower leg, subsequent encounter: Secondary | ICD-10-CM | POA: Diagnosis not present

## 2019-07-21 DIAGNOSIS — S81801D Unspecified open wound, right lower leg, subsequent encounter: Secondary | ICD-10-CM | POA: Diagnosis not present

## 2019-07-21 DIAGNOSIS — Z4800 Encounter for change or removal of nonsurgical wound dressing: Secondary | ICD-10-CM | POA: Diagnosis not present

## 2019-07-21 DIAGNOSIS — L97828 Non-pressure chronic ulcer of other part of left lower leg with other specified severity: Secondary | ICD-10-CM | POA: Diagnosis not present

## 2019-07-21 DIAGNOSIS — I872 Venous insufficiency (chronic) (peripheral): Secondary | ICD-10-CM | POA: Diagnosis not present

## 2019-07-24 DIAGNOSIS — S81802D Unspecified open wound, left lower leg, subsequent encounter: Secondary | ICD-10-CM | POA: Diagnosis not present

## 2019-07-24 DIAGNOSIS — H2513 Age-related nuclear cataract, bilateral: Secondary | ICD-10-CM | POA: Diagnosis not present

## 2019-07-24 DIAGNOSIS — H40023 Open angle with borderline findings, high risk, bilateral: Secondary | ICD-10-CM | POA: Diagnosis not present

## 2019-07-24 DIAGNOSIS — L97818 Non-pressure chronic ulcer of other part of right lower leg with other specified severity: Secondary | ICD-10-CM | POA: Diagnosis not present

## 2019-07-24 DIAGNOSIS — S81801D Unspecified open wound, right lower leg, subsequent encounter: Secondary | ICD-10-CM | POA: Diagnosis not present

## 2019-07-24 DIAGNOSIS — H25813 Combined forms of age-related cataract, bilateral: Secondary | ICD-10-CM | POA: Diagnosis not present

## 2019-07-24 DIAGNOSIS — H43813 Vitreous degeneration, bilateral: Secondary | ICD-10-CM | POA: Diagnosis not present

## 2019-07-24 DIAGNOSIS — L97828 Non-pressure chronic ulcer of other part of left lower leg with other specified severity: Secondary | ICD-10-CM | POA: Diagnosis not present

## 2019-07-24 DIAGNOSIS — H11041 Peripheral pterygium, stationary, right eye: Secondary | ICD-10-CM | POA: Diagnosis not present

## 2019-07-24 DIAGNOSIS — I872 Venous insufficiency (chronic) (peripheral): Secondary | ICD-10-CM | POA: Diagnosis not present

## 2019-07-24 DIAGNOSIS — Z4800 Encounter for change or removal of nonsurgical wound dressing: Secondary | ICD-10-CM | POA: Diagnosis not present

## 2019-07-25 DIAGNOSIS — N390 Urinary tract infection, site not specified: Secondary | ICD-10-CM | POA: Insufficient documentation

## 2019-07-25 DIAGNOSIS — B9629 Other Escherichia coli [E. coli] as the cause of diseases classified elsewhere: Secondary | ICD-10-CM | POA: Insufficient documentation

## 2019-07-25 NOTE — Patient Instructions (Signed)
Macrobid for ESBL UTI. Continue wrapping of ulcers. Needs flu vaccine this Fall. No change in other meds. RTC one year.

## 2019-07-28 DIAGNOSIS — I83891 Varicose veins of right lower extremities with other complications: Secondary | ICD-10-CM | POA: Diagnosis not present

## 2019-07-28 DIAGNOSIS — I83029 Varicose veins of left lower extremity with ulcer of unspecified site: Secondary | ICD-10-CM | POA: Diagnosis not present

## 2019-07-28 DIAGNOSIS — K4090 Unilateral inguinal hernia, without obstruction or gangrene, not specified as recurrent: Secondary | ICD-10-CM | POA: Diagnosis not present

## 2019-07-28 DIAGNOSIS — I872 Venous insufficiency (chronic) (peripheral): Secondary | ICD-10-CM | POA: Diagnosis not present

## 2019-07-28 DIAGNOSIS — L97929 Non-pressure chronic ulcer of unspecified part of left lower leg with unspecified severity: Secondary | ICD-10-CM | POA: Diagnosis not present

## 2019-07-28 DIAGNOSIS — I83019 Varicose veins of right lower extremity with ulcer of unspecified site: Secondary | ICD-10-CM | POA: Diagnosis not present

## 2019-07-28 DIAGNOSIS — L97812 Non-pressure chronic ulcer of other part of right lower leg with fat layer exposed: Secondary | ICD-10-CM | POA: Diagnosis not present

## 2019-07-28 DIAGNOSIS — R609 Edema, unspecified: Secondary | ICD-10-CM | POA: Diagnosis not present

## 2019-07-28 DIAGNOSIS — I83892 Varicose veins of left lower extremities with other complications: Secondary | ICD-10-CM | POA: Diagnosis not present

## 2019-07-28 DIAGNOSIS — L97919 Non-pressure chronic ulcer of unspecified part of right lower leg with unspecified severity: Secondary | ICD-10-CM | POA: Diagnosis not present

## 2019-07-28 DIAGNOSIS — L97822 Non-pressure chronic ulcer of other part of left lower leg with fat layer exposed: Secondary | ICD-10-CM | POA: Diagnosis not present

## 2019-07-31 DIAGNOSIS — L97818 Non-pressure chronic ulcer of other part of right lower leg with other specified severity: Secondary | ICD-10-CM | POA: Diagnosis not present

## 2019-07-31 DIAGNOSIS — S81801D Unspecified open wound, right lower leg, subsequent encounter: Secondary | ICD-10-CM | POA: Diagnosis not present

## 2019-07-31 DIAGNOSIS — I872 Venous insufficiency (chronic) (peripheral): Secondary | ICD-10-CM | POA: Diagnosis not present

## 2019-07-31 DIAGNOSIS — Z4800 Encounter for change or removal of nonsurgical wound dressing: Secondary | ICD-10-CM | POA: Diagnosis not present

## 2019-07-31 DIAGNOSIS — S81802D Unspecified open wound, left lower leg, subsequent encounter: Secondary | ICD-10-CM | POA: Diagnosis not present

## 2019-07-31 DIAGNOSIS — L97828 Non-pressure chronic ulcer of other part of left lower leg with other specified severity: Secondary | ICD-10-CM | POA: Diagnosis not present

## 2019-08-04 DIAGNOSIS — L97818 Non-pressure chronic ulcer of other part of right lower leg with other specified severity: Secondary | ICD-10-CM | POA: Diagnosis not present

## 2019-08-04 DIAGNOSIS — S81801D Unspecified open wound, right lower leg, subsequent encounter: Secondary | ICD-10-CM | POA: Diagnosis not present

## 2019-08-04 DIAGNOSIS — I872 Venous insufficiency (chronic) (peripheral): Secondary | ICD-10-CM | POA: Diagnosis not present

## 2019-08-04 DIAGNOSIS — L97828 Non-pressure chronic ulcer of other part of left lower leg with other specified severity: Secondary | ICD-10-CM | POA: Diagnosis not present

## 2019-08-04 DIAGNOSIS — S81802D Unspecified open wound, left lower leg, subsequent encounter: Secondary | ICD-10-CM | POA: Diagnosis not present

## 2019-08-04 DIAGNOSIS — Z4800 Encounter for change or removal of nonsurgical wound dressing: Secondary | ICD-10-CM | POA: Diagnosis not present

## 2019-08-07 DIAGNOSIS — I872 Venous insufficiency (chronic) (peripheral): Secondary | ICD-10-CM | POA: Diagnosis not present

## 2019-08-07 DIAGNOSIS — Z4800 Encounter for change or removal of nonsurgical wound dressing: Secondary | ICD-10-CM | POA: Diagnosis not present

## 2019-08-07 DIAGNOSIS — L97818 Non-pressure chronic ulcer of other part of right lower leg with other specified severity: Secondary | ICD-10-CM | POA: Diagnosis not present

## 2019-08-07 DIAGNOSIS — S81802D Unspecified open wound, left lower leg, subsequent encounter: Secondary | ICD-10-CM | POA: Diagnosis not present

## 2019-08-07 DIAGNOSIS — S81801D Unspecified open wound, right lower leg, subsequent encounter: Secondary | ICD-10-CM | POA: Diagnosis not present

## 2019-08-07 DIAGNOSIS — L97828 Non-pressure chronic ulcer of other part of left lower leg with other specified severity: Secondary | ICD-10-CM | POA: Diagnosis not present

## 2019-08-11 DIAGNOSIS — L97812 Non-pressure chronic ulcer of other part of right lower leg with fat layer exposed: Secondary | ICD-10-CM | POA: Diagnosis not present

## 2019-08-11 DIAGNOSIS — R609 Edema, unspecified: Secondary | ICD-10-CM | POA: Diagnosis not present

## 2019-08-11 DIAGNOSIS — I83018 Varicose veins of right lower extremity with ulcer other part of lower leg: Secondary | ICD-10-CM | POA: Diagnosis not present

## 2019-08-11 DIAGNOSIS — I872 Venous insufficiency (chronic) (peripheral): Secondary | ICD-10-CM | POA: Diagnosis not present

## 2019-08-11 DIAGNOSIS — I83028 Varicose veins of left lower extremity with ulcer other part of lower leg: Secondary | ICD-10-CM | POA: Diagnosis not present

## 2019-08-11 DIAGNOSIS — L97822 Non-pressure chronic ulcer of other part of left lower leg with fat layer exposed: Secondary | ICD-10-CM | POA: Diagnosis not present

## 2019-08-14 DIAGNOSIS — L97828 Non-pressure chronic ulcer of other part of left lower leg with other specified severity: Secondary | ICD-10-CM | POA: Diagnosis not present

## 2019-08-14 DIAGNOSIS — I872 Venous insufficiency (chronic) (peripheral): Secondary | ICD-10-CM | POA: Diagnosis not present

## 2019-08-14 DIAGNOSIS — Z4800 Encounter for change or removal of nonsurgical wound dressing: Secondary | ICD-10-CM | POA: Diagnosis not present

## 2019-08-14 DIAGNOSIS — L97818 Non-pressure chronic ulcer of other part of right lower leg with other specified severity: Secondary | ICD-10-CM | POA: Diagnosis not present

## 2019-08-14 DIAGNOSIS — S81802D Unspecified open wound, left lower leg, subsequent encounter: Secondary | ICD-10-CM | POA: Diagnosis not present

## 2019-08-14 DIAGNOSIS — S81801D Unspecified open wound, right lower leg, subsequent encounter: Secondary | ICD-10-CM | POA: Diagnosis not present

## 2019-08-18 ENCOUNTER — Encounter: Payer: Self-pay | Admitting: Internal Medicine

## 2019-08-18 ENCOUNTER — Telehealth (INDEPENDENT_AMBULATORY_CARE_PROVIDER_SITE_OTHER): Payer: Medicare Other | Admitting: Internal Medicine

## 2019-08-18 ENCOUNTER — Telehealth: Payer: Self-pay | Admitting: Internal Medicine

## 2019-08-18 DIAGNOSIS — S81801D Unspecified open wound, right lower leg, subsequent encounter: Secondary | ICD-10-CM | POA: Diagnosis not present

## 2019-08-18 DIAGNOSIS — I872 Venous insufficiency (chronic) (peripheral): Secondary | ICD-10-CM | POA: Diagnosis not present

## 2019-08-18 DIAGNOSIS — L97828 Non-pressure chronic ulcer of other part of left lower leg with other specified severity: Secondary | ICD-10-CM | POA: Diagnosis not present

## 2019-08-18 DIAGNOSIS — Z4800 Encounter for change or removal of nonsurgical wound dressing: Secondary | ICD-10-CM | POA: Diagnosis not present

## 2019-08-18 DIAGNOSIS — L97818 Non-pressure chronic ulcer of other part of right lower leg with other specified severity: Secondary | ICD-10-CM | POA: Diagnosis not present

## 2019-08-18 DIAGNOSIS — S81802D Unspecified open wound, left lower leg, subsequent encounter: Secondary | ICD-10-CM | POA: Diagnosis not present

## 2019-08-18 MED ORDER — HYDROCODONE-ACETAMINOPHEN 5-325 MG PO TABS: ORAL_TABLET | ORAL | 0 refills | Status: DC

## 2019-08-18 NOTE — Telephone Encounter (Signed)
Estill Bamberg an RN with Home health and pts case manager has concerns about pt and would like to talk with Dr Renold Genta. Her phone number [phone]. It is about pts legs that haven't gotten better

## 2019-08-18 NOTE — Telephone Encounter (Signed)
Telephone call from [place], home health care nurse regarding patient and chronic leg wounds. Nurse is very frustrated with situation. She goes to the home twice one week and once the next week. Pt currently going to wound care cennter in [facility] every other week apparently. Patient recently saw Dr. Gwenlyn Found in August for Cardiology follow up.  Patient cries with pain when legs are dressed. This upsets nurse. Nurse is asking for pain med to give pt prior to visit. I have called in [place] to be given 2 hours before wound dressing.  Apparently one option is the [facility] in [place] which is closer to their home.  Nurse thinks patient not getting Zyprexa for agitation. Is up on legs a lot. Nurse concerned about patient's circulation. Pt had venous reflux studies done November 2019 with abnormal reflux times bilaterally but no evidence of clots.  Nurse has tried to connect with daughter who is an Programmer, applications. but has not spoken directly with her recently.  Nurse feels wounds are worse than before.  Patient's husband is stubborn and does not understand why these treatments are necessary apparently.  I will attempt to contact daughter to discuss.

## 2019-08-18 NOTE — Telephone Encounter (Signed)
Attempted to call daughter, Leigh Aurora regarding phone call from [place], Estill Bamberg about management of leg ulcers that are not healing well and may be worse. I have called in small quantity of Norco 5/325 at nurse's request to give patient a couple of hours before wound care home health dressing appts because it seems painful to patient. She cries a lot. Nurse not sure pt is getting Zyprexa at night either. Nurse asked if gabapentin a possibility but do not think this is appropriate in this setting.May need more frequent visits to wound care either in [facility] or [place] as suggested by nurse.

## 2019-08-19 DIAGNOSIS — Z436 Encounter for attention to other artificial openings of urinary tract: Secondary | ICD-10-CM | POA: Diagnosis not present

## 2019-08-19 DIAGNOSIS — L97828 Non-pressure chronic ulcer of other part of left lower leg with other specified severity: Secondary | ICD-10-CM | POA: Diagnosis not present

## 2019-08-19 DIAGNOSIS — D509 Iron deficiency anemia, unspecified: Secondary | ICD-10-CM | POA: Diagnosis not present

## 2019-08-19 DIAGNOSIS — Q6211 Congenital occlusion of ureteropelvic junction: Secondary | ICD-10-CM | POA: Diagnosis not present

## 2019-08-19 DIAGNOSIS — N281 Cyst of kidney, acquired: Secondary | ICD-10-CM | POA: Diagnosis not present

## 2019-08-19 DIAGNOSIS — Z87440 Personal history of urinary (tract) infections: Secondary | ICD-10-CM | POA: Diagnosis not present

## 2019-08-19 DIAGNOSIS — F0390 Unspecified dementia without behavioral disturbance: Secondary | ICD-10-CM | POA: Diagnosis not present

## 2019-08-19 DIAGNOSIS — Z4800 Encounter for change or removal of nonsurgical wound dressing: Secondary | ICD-10-CM | POA: Diagnosis not present

## 2019-08-19 DIAGNOSIS — M810 Age-related osteoporosis without current pathological fracture: Secondary | ICD-10-CM | POA: Diagnosis not present

## 2019-08-19 DIAGNOSIS — I872 Venous insufficiency (chronic) (peripheral): Secondary | ICD-10-CM | POA: Diagnosis not present

## 2019-08-19 DIAGNOSIS — S81802D Unspecified open wound, left lower leg, subsequent encounter: Secondary | ICD-10-CM | POA: Diagnosis not present

## 2019-08-19 DIAGNOSIS — I4891 Unspecified atrial fibrillation: Secondary | ICD-10-CM | POA: Diagnosis not present

## 2019-08-19 DIAGNOSIS — Z9181 History of falling: Secondary | ICD-10-CM | POA: Diagnosis not present

## 2019-08-19 DIAGNOSIS — K219 Gastro-esophageal reflux disease without esophagitis: Secondary | ICD-10-CM | POA: Diagnosis not present

## 2019-08-19 DIAGNOSIS — Z96652 Presence of left artificial knee joint: Secondary | ICD-10-CM | POA: Diagnosis not present

## 2019-08-19 DIAGNOSIS — D72825 Bandemia: Secondary | ICD-10-CM | POA: Diagnosis not present

## 2019-08-19 DIAGNOSIS — I739 Peripheral vascular disease, unspecified: Secondary | ICD-10-CM | POA: Diagnosis not present

## 2019-08-19 DIAGNOSIS — R001 Bradycardia, unspecified: Secondary | ICD-10-CM | POA: Diagnosis not present

## 2019-08-19 DIAGNOSIS — S81801D Unspecified open wound, right lower leg, subsequent encounter: Secondary | ICD-10-CM | POA: Diagnosis not present

## 2019-08-19 DIAGNOSIS — L97818 Non-pressure chronic ulcer of other part of right lower leg with other specified severity: Secondary | ICD-10-CM | POA: Diagnosis not present

## 2019-08-19 DIAGNOSIS — I119 Hypertensive heart disease without heart failure: Secondary | ICD-10-CM | POA: Diagnosis not present

## 2019-08-19 DIAGNOSIS — N179 Acute kidney failure, unspecified: Secondary | ICD-10-CM | POA: Diagnosis not present

## 2019-08-19 DIAGNOSIS — K4090 Unilateral inguinal hernia, without obstruction or gangrene, not specified as recurrent: Secondary | ICD-10-CM | POA: Diagnosis not present

## 2019-08-21 DIAGNOSIS — L97818 Non-pressure chronic ulcer of other part of right lower leg with other specified severity: Secondary | ICD-10-CM | POA: Diagnosis not present

## 2019-08-21 DIAGNOSIS — Z4800 Encounter for change or removal of nonsurgical wound dressing: Secondary | ICD-10-CM | POA: Diagnosis not present

## 2019-08-21 DIAGNOSIS — L97828 Non-pressure chronic ulcer of other part of left lower leg with other specified severity: Secondary | ICD-10-CM | POA: Diagnosis not present

## 2019-08-21 DIAGNOSIS — S81801D Unspecified open wound, right lower leg, subsequent encounter: Secondary | ICD-10-CM | POA: Diagnosis not present

## 2019-08-21 DIAGNOSIS — I872 Venous insufficiency (chronic) (peripheral): Secondary | ICD-10-CM | POA: Diagnosis not present

## 2019-08-21 DIAGNOSIS — S81802D Unspecified open wound, left lower leg, subsequent encounter: Secondary | ICD-10-CM | POA: Diagnosis not present

## 2019-08-25 ENCOUNTER — Other Ambulatory Visit: Payer: Self-pay | Admitting: *Deleted

## 2019-08-25 DIAGNOSIS — I83018 Varicose veins of right lower extremity with ulcer other part of lower leg: Secondary | ICD-10-CM | POA: Diagnosis not present

## 2019-08-25 DIAGNOSIS — L97822 Non-pressure chronic ulcer of other part of left lower leg with fat layer exposed: Secondary | ICD-10-CM | POA: Diagnosis not present

## 2019-08-25 DIAGNOSIS — R609 Edema, unspecified: Secondary | ICD-10-CM | POA: Diagnosis not present

## 2019-08-25 DIAGNOSIS — L97812 Non-pressure chronic ulcer of other part of right lower leg with fat layer exposed: Secondary | ICD-10-CM | POA: Diagnosis not present

## 2019-08-25 DIAGNOSIS — I872 Venous insufficiency (chronic) (peripheral): Secondary | ICD-10-CM | POA: Diagnosis not present

## 2019-08-25 DIAGNOSIS — I83028 Varicose veins of left lower extremity with ulcer other part of lower leg: Secondary | ICD-10-CM | POA: Diagnosis not present

## 2019-08-25 DIAGNOSIS — F0390 Unspecified dementia without behavioral disturbance: Secondary | ICD-10-CM | POA: Diagnosis not present

## 2019-08-25 DIAGNOSIS — I83019 Varicose veins of right lower extremity with ulcer of unspecified site: Secondary | ICD-10-CM

## 2019-08-25 DIAGNOSIS — I83029 Varicose veins of left lower extremity with ulcer of unspecified site: Secondary | ICD-10-CM

## 2019-08-25 DIAGNOSIS — L97821 Non-pressure chronic ulcer of other part of left lower leg limited to breakdown of skin: Secondary | ICD-10-CM | POA: Diagnosis not present

## 2019-08-26 ENCOUNTER — Other Ambulatory Visit: Payer: Self-pay

## 2019-08-26 ENCOUNTER — Ambulatory Visit (INDEPENDENT_AMBULATORY_CARE_PROVIDER_SITE_OTHER): Payer: Medicare Other | Admitting: Vascular Surgery

## 2019-08-26 ENCOUNTER — Encounter: Payer: Self-pay | Admitting: Vascular Surgery

## 2019-08-26 ENCOUNTER — Ambulatory Visit (HOSPITAL_COMMUNITY)
Admission: RE | Admit: 2019-08-26 | Discharge: 2019-08-26 | Disposition: A | Discharge status: Home / Self Care | Payer: Medicare Other | Source: Ambulatory Visit | Attending: Family | Admitting: Family

## 2019-08-26 VITALS — BP 151/68 | HR 55 | Temp 97.9°F | Resp 14 | Ht 66.0 in | Wt 144.9 lb

## 2019-08-26 DIAGNOSIS — I83813 Varicose veins of bilateral lower extremities with pain: Secondary | ICD-10-CM | POA: Diagnosis not present

## 2019-08-26 DIAGNOSIS — I83029 Varicose veins of left lower extremity with ulcer of unspecified site: Secondary | ICD-10-CM | POA: Diagnosis not present

## 2019-08-26 DIAGNOSIS — L97929 Non-pressure chronic ulcer of unspecified part of left lower leg with unspecified severity: Secondary | ICD-10-CM | POA: Diagnosis not present

## 2019-08-26 DIAGNOSIS — I83019 Varicose veins of right lower extremity with ulcer of unspecified site: Secondary | ICD-10-CM

## 2019-08-26 DIAGNOSIS — L97919 Non-pressure chronic ulcer of unspecified part of right lower leg with unspecified severity: Secondary | ICD-10-CM | POA: Diagnosis not present

## 2019-08-26 NOTE — Progress Notes (Signed)
Patient name: Donna Woods MRN: [id number] DOB: [birth year] Sex: female  REASON FOR CONSULT: Venous stasis ulcers  HPI: Donna Woods is a 85 y.o. female, with history significant for multiple recurrences of venous stasis ulcers.  She was last seen in October 2019.  At that point we were going to consider a right laser ablation of her right greater saphenous vein.  She had venous stasis ulcers at the time.  However, due to multiple factors including a hospitalization and the recent COVID problem her procedure was put on hold.  She now returns with worsening ulcerations in both lower extremities.  These apparently started from some scratching on her skin recently.  She is currently being followed at the wound center.  She has considerable pain from the ulcers and was tearful all during her office visit today after removing her dressings.  Other medical problems include mild dementia, arthritis, hypertension all of which have been stable.  Her husband was present for the office visit today.  Her daughter who works as an Runner, broadcasting/film/video was also present by phone.  Past Medical History:  Diagnosis Date  . Bradycardia   . DJD (degenerative joint disease)   . Fe deficiency anemia   . GERD (gastroesophageal reflux disease)   . HTN (hypertension)   . Hypokalemia   . Memory changes   . 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 Poor historian    pt records indicate hospitlaization in Jan 2020 in [place]. patient presents to pre-op with nephrostomy tub in place but unable to recall the hospitlization and procedure .   Marland Kitchen Prediabetes   . PVC's (premature ventricular contractions)    denies any recent fluttering episodes, able to inhale deeply and exhale completely w/o difficulty, denies SOB   . Vitamin B deficiency    Past Surgical History:  Procedure Laterality Date  . ABDOMINAL HYSTERECTOMY    . BREAST SURGERY     BENIGN LUMP REMOVED FROM ?LEFT bREAST  . CHOLECYSTECTOMY    . CYSTOSCOPY  WITH RETROGRADE PYELOGRAM, URETEROSCOPY AND STENT PLACEMENT Right 04/15/2019   Procedure: CYSTOSCOPY WITH RIGHT RETROGRADE PYELOGRAM, URETEROSCOPY  WITH BALLOON DILATION/INSERSTION OF URETERAL STENT/REMOVAL LEFT NEPHROSTOMY TUBE;  Surgeon: Lucas Mallow, MD;  Location: [facility];  Service: Urology;  Laterality: Right;  . JOINT REPLACEMENT     RTA  . NEPHROSTOMY     nephrostomy tube in place   . TONSILLECTOMY    . TOTAL ABDOMINAL HYSTERECTOMY W/ BILATERAL SALPINGOOPHORECTOMY     OVARY REMOVED A YR OR TWO BEFORE HYSTERECTOMY  . TOTAL KNEE ARTHROPLASTY Left 12/06/2015   Procedure: TOTAL KNEE ARTHROPLASTY;  Surgeon: Garald Balding, MD;  Location: [facility];  Service: Orthopedics;  Laterality: Left;    Family History  Problem Relation Age of Onset  . Pneumonia Mother   . Pulmonary disease Father     SOCIAL HISTORY: Social History   Socioeconomic History  . Marital status: Married    Spouse name: Not on file  . Number of children: Not on file  . Years of education: Not on file  . Highest education level: Not on file  Occupational History  . Not on file  Social Needs  . Financial resource strain: Not on file  . Food insecurity    Worry: Not on file    Inability: Not on file  . Transportation needs    Medical: Not on file    Non-medical: Not on file  Tobacco Use  .  Smoking status: Never Smoker  . Smokeless tobacco: Never Used  Substance and Sexual Activity  . Alcohol use: No  . Drug use: No  . Sexual activity: Never  Lifestyle  . Physical activity    Days per week: Not on file    Minutes per session: Not on file  . Stress: Not on file  Relationships  . Social Herbalist on phone: Not on file    Gets together: Not on file    Attends religious service: Not on file    Active member of club or organization: Not on file    Attends meetings of clubs or organizations: Not on file    Relationship status: Not on file  . Intimate partner violence    Fear of current  or ex partner: Not on file    Emotionally abused: Not on file    Physically abused: Not on file    Forced sexual activity: Not on file  Other Topics Concern  . Not on file  Social History Narrative  . Not on file    Allergies  Allergen Reactions  . Benicar [Olmesartan] Other (See Comments)    Unknown reaction type  . Penicillins Rash    Has patient had a PCN reaction causing immediate rash, facial/tongue/throat swelling, SOB or lightheadedness with hypotension: Yes Has patient had a PCN reaction causing severe rash involving mucus membranes or skin necrosis: No Has patient had a PCN reaction that required hospitalization No Has patient had a PCN reaction occurring within the last 10 years: No If all of the above answers are "NO", then may proceed with Cephalosporin use.   . Tape Other (See Comments)    Causes blisters    Current Outpatient Medications  Medication Sig Dispense Refill  . acetaminophen (TYLENOL) 500 MG tablet Take 500 mg by mouth daily.    Marland Kitchen atenolol (TENORMIN) 25 MG tablet Take 0.5 tablets (12.5 mg total) by mouth daily. 45 tablet 1  . cetirizine (ZYRTEC) 10 MG tablet Take 10 mg by mouth daily.    . cholecalciferol (VITAMIN D) 25 MCG (1000 UT) tablet Take 1,000 Units by mouth daily.    Marland Kitchen HYDROcodone-acetaminophen (NORCO) 5-325 MG tablet One tab by mouth 2 hours before home health care nurse arrives for wound care 20 tablet 0  . lidocaine (LIDODERM) 5 % APPLY 1 TO 3 PATCHES FOR 12 HOURS OUT OF 24 HOURS AS DIRECTED (Patient taking differently: Place 1-3 patches onto the skin daily as needed (pain.). ) 90 patch 3  . Multiple Vitamin (MULTIVITAMIN WITH MINERALS) TABS tablet Take 1 tablet by mouth daily. Geritol Tablets    . OLANZapine (ZYPREXA) 5 MG tablet Take 1 tablet (5 mg total) by mouth at bedtime. 30 tablet 2   No current facility-administered medications for this visit.     ROS:   Unable to obtain due to patient's emotional state  Physical Examination   Vitals:   08/26/19 1217  BP: (!) 151/68  Pulse: (!) 55  Resp: 14  Temp: 97.9 F (36.6 C)  TempSrc: Temporal  SpO2: 98%  Weight: 144 lb 14.4 oz (65.7 kg)  Height: 5\' 6"  (1.676 m)    Body mass index is 23.39 kg/m.  General:  Alert and oriented, tearful with considerable pain in the lower extremities HEENT: Normal Skin: No rash, bilateral ulcers see images below Extremity Pulses:  2+ radial, brachial, femoral, dorsalis pedis pulses bilaterally Musculoskeletal: No deformity or edema  Neurologic: Upper and lower extremity motor  5/5 and symmetric      DATA:  Patient had a venous reflux exam today.  This shows a 4 to 5 mm greater saphenous vein bilaterally with diffuse reflux.  No evidence of DVT.  ASSESSMENT: Had a lengthy discussion today with the patient and her husband and her daughter.  I believe she would benefit from laser ablation of the greater saphenous vein to assist in wound healing.  However we also did discuss to have realistic expectations that the laser ablation would only be 1 part of the wound healing process.  They will still continue to have follow-up at the wound center and will probably be several weeks to get these ulcerations healed.  We then also discussed prevention of recurrent ulcerations down the road.  At this point they have opted for laser ablation of the greater saphenous vein.  Our plan would be to schedule her for interval laser ablation.  We would do the right leg first followed by the left a few weeks later.  Risk benefits possible complications and procedure details were discussed with the patient husband and her daughter today.  They understand and agree to proceed.  They are working on dates that will fit their schedule.   PLAN: See above   Ruta Hinds, MD [facility] Office: [phone] Pager: [phone]

## 2019-08-26 NOTE — Progress Notes (Signed)
Thank you so much

## 2019-08-27 ENCOUNTER — Other Ambulatory Visit: Payer: Self-pay | Admitting: *Deleted

## 2019-08-27 DIAGNOSIS — S81801D Unspecified open wound, right lower leg, subsequent encounter: Secondary | ICD-10-CM | POA: Diagnosis not present

## 2019-08-27 DIAGNOSIS — L97818 Non-pressure chronic ulcer of other part of right lower leg with other specified severity: Secondary | ICD-10-CM | POA: Diagnosis not present

## 2019-08-27 DIAGNOSIS — Z4800 Encounter for change or removal of nonsurgical wound dressing: Secondary | ICD-10-CM | POA: Diagnosis not present

## 2019-08-27 DIAGNOSIS — I83019 Varicose veins of right lower extremity with ulcer of unspecified site: Secondary | ICD-10-CM

## 2019-08-27 DIAGNOSIS — L97828 Non-pressure chronic ulcer of other part of left lower leg with other specified severity: Secondary | ICD-10-CM | POA: Diagnosis not present

## 2019-08-27 DIAGNOSIS — L97919 Non-pressure chronic ulcer of unspecified part of right lower leg with unspecified severity: Secondary | ICD-10-CM

## 2019-08-27 DIAGNOSIS — S81802D Unspecified open wound, left lower leg, subsequent encounter: Secondary | ICD-10-CM | POA: Diagnosis not present

## 2019-08-27 DIAGNOSIS — I872 Venous insufficiency (chronic) (peripheral): Secondary | ICD-10-CM | POA: Diagnosis not present

## 2019-09-01 DIAGNOSIS — L97822 Non-pressure chronic ulcer of other part of left lower leg with fat layer exposed: Secondary | ICD-10-CM | POA: Diagnosis not present

## 2019-09-01 DIAGNOSIS — I83892 Varicose veins of left lower extremities with other complications: Secondary | ICD-10-CM | POA: Diagnosis not present

## 2019-09-01 DIAGNOSIS — R609 Edema, unspecified: Secondary | ICD-10-CM | POA: Diagnosis not present

## 2019-09-01 DIAGNOSIS — F0390 Unspecified dementia without behavioral disturbance: Secondary | ICD-10-CM | POA: Diagnosis not present

## 2019-09-01 DIAGNOSIS — I872 Venous insufficiency (chronic) (peripheral): Secondary | ICD-10-CM | POA: Diagnosis not present

## 2019-09-01 DIAGNOSIS — I83891 Varicose veins of right lower extremities with other complications: Secondary | ICD-10-CM | POA: Diagnosis not present

## 2019-09-01 DIAGNOSIS — I83028 Varicose veins of left lower extremity with ulcer other part of lower leg: Secondary | ICD-10-CM | POA: Diagnosis not present

## 2019-09-01 DIAGNOSIS — I83018 Varicose veins of right lower extremity with ulcer other part of lower leg: Secondary | ICD-10-CM | POA: Diagnosis not present

## 2019-09-01 DIAGNOSIS — L97812 Non-pressure chronic ulcer of other part of right lower leg with fat layer exposed: Secondary | ICD-10-CM | POA: Diagnosis not present

## 2019-09-03 ENCOUNTER — Telehealth: Payer: Self-pay | Admitting: Internal Medicine

## 2019-09-03 NOTE — Telephone Encounter (Signed)
Received orders from [facility] to put home health orders on hold because patient is going to be seen by wound care center for visits for now.

## 2019-09-04 DIAGNOSIS — I83891 Varicose veins of right lower extremities with other complications: Secondary | ICD-10-CM | POA: Diagnosis not present

## 2019-09-04 DIAGNOSIS — I83892 Varicose veins of left lower extremities with other complications: Secondary | ICD-10-CM | POA: Diagnosis not present

## 2019-09-04 DIAGNOSIS — F0390 Unspecified dementia without behavioral disturbance: Secondary | ICD-10-CM | POA: Diagnosis not present

## 2019-09-04 DIAGNOSIS — I83018 Varicose veins of right lower extremity with ulcer other part of lower leg: Secondary | ICD-10-CM | POA: Diagnosis not present

## 2019-09-04 DIAGNOSIS — I83028 Varicose veins of left lower extremity with ulcer other part of lower leg: Secondary | ICD-10-CM | POA: Diagnosis not present

## 2019-09-04 DIAGNOSIS — L97822 Non-pressure chronic ulcer of other part of left lower leg with fat layer exposed: Secondary | ICD-10-CM | POA: Diagnosis not present

## 2019-09-04 DIAGNOSIS — R609 Edema, unspecified: Secondary | ICD-10-CM | POA: Diagnosis not present

## 2019-09-04 DIAGNOSIS — I872 Venous insufficiency (chronic) (peripheral): Secondary | ICD-10-CM | POA: Diagnosis not present

## 2019-09-04 DIAGNOSIS — Z23 Encounter for immunization: Secondary | ICD-10-CM | POA: Diagnosis not present

## 2019-09-04 DIAGNOSIS — L97812 Non-pressure chronic ulcer of other part of right lower leg with fat layer exposed: Secondary | ICD-10-CM | POA: Diagnosis not present

## 2019-09-07 DIAGNOSIS — L97822 Non-pressure chronic ulcer of other part of left lower leg with fat layer exposed: Secondary | ICD-10-CM | POA: Diagnosis not present

## 2019-09-07 DIAGNOSIS — L97812 Non-pressure chronic ulcer of other part of right lower leg with fat layer exposed: Secondary | ICD-10-CM | POA: Diagnosis not present

## 2019-09-07 DIAGNOSIS — L97821 Non-pressure chronic ulcer of other part of left lower leg limited to breakdown of skin: Secondary | ICD-10-CM | POA: Diagnosis not present

## 2019-09-07 DIAGNOSIS — I872 Venous insufficiency (chronic) (peripheral): Secondary | ICD-10-CM | POA: Diagnosis not present

## 2019-09-07 DIAGNOSIS — I87313 Chronic venous hypertension (idiopathic) with ulcer of bilateral lower extremity: Secondary | ICD-10-CM | POA: Diagnosis not present

## 2019-09-07 DIAGNOSIS — L97811 Non-pressure chronic ulcer of other part of right lower leg limited to breakdown of skin: Secondary | ICD-10-CM | POA: Diagnosis not present

## 2019-09-09 DIAGNOSIS — I872 Venous insufficiency (chronic) (peripheral): Secondary | ICD-10-CM | POA: Diagnosis not present

## 2019-09-09 DIAGNOSIS — I87313 Chronic venous hypertension (idiopathic) with ulcer of bilateral lower extremity: Secondary | ICD-10-CM | POA: Diagnosis not present

## 2019-09-09 DIAGNOSIS — L97812 Non-pressure chronic ulcer of other part of right lower leg with fat layer exposed: Secondary | ICD-10-CM | POA: Diagnosis not present

## 2019-09-09 DIAGNOSIS — L97822 Non-pressure chronic ulcer of other part of left lower leg with fat layer exposed: Secondary | ICD-10-CM | POA: Diagnosis not present

## 2019-09-11 DIAGNOSIS — L97812 Non-pressure chronic ulcer of other part of right lower leg with fat layer exposed: Secondary | ICD-10-CM | POA: Diagnosis not present

## 2019-09-11 DIAGNOSIS — I872 Venous insufficiency (chronic) (peripheral): Secondary | ICD-10-CM | POA: Diagnosis not present

## 2019-09-11 DIAGNOSIS — L97822 Non-pressure chronic ulcer of other part of left lower leg with fat layer exposed: Secondary | ICD-10-CM | POA: Diagnosis not present

## 2019-09-11 DIAGNOSIS — I83018 Varicose veins of right lower extremity with ulcer other part of lower leg: Secondary | ICD-10-CM | POA: Diagnosis not present

## 2019-09-11 DIAGNOSIS — I83028 Varicose veins of left lower extremity with ulcer other part of lower leg: Secondary | ICD-10-CM | POA: Diagnosis not present

## 2019-09-11 DIAGNOSIS — R609 Edema, unspecified: Secondary | ICD-10-CM | POA: Diagnosis not present

## 2019-09-14 DIAGNOSIS — R609 Edema, unspecified: Secondary | ICD-10-CM | POA: Diagnosis not present

## 2019-09-14 DIAGNOSIS — I83029 Varicose veins of left lower extremity with ulcer of unspecified site: Secondary | ICD-10-CM | POA: Diagnosis not present

## 2019-09-14 DIAGNOSIS — L97822 Non-pressure chronic ulcer of other part of left lower leg with fat layer exposed: Secondary | ICD-10-CM | POA: Diagnosis not present

## 2019-09-14 DIAGNOSIS — I83891 Varicose veins of right lower extremities with other complications: Secondary | ICD-10-CM | POA: Diagnosis not present

## 2019-09-14 DIAGNOSIS — L97222 Non-pressure chronic ulcer of left calf with fat layer exposed: Secondary | ICD-10-CM | POA: Diagnosis not present

## 2019-09-14 DIAGNOSIS — L97919 Non-pressure chronic ulcer of unspecified part of right lower leg with unspecified severity: Secondary | ICD-10-CM | POA: Diagnosis not present

## 2019-09-14 DIAGNOSIS — I83892 Varicose veins of left lower extremities with other complications: Secondary | ICD-10-CM | POA: Diagnosis not present

## 2019-09-14 DIAGNOSIS — I872 Venous insufficiency (chronic) (peripheral): Secondary | ICD-10-CM | POA: Diagnosis not present

## 2019-09-14 DIAGNOSIS — I83019 Varicose veins of right lower extremity with ulcer of unspecified site: Secondary | ICD-10-CM | POA: Diagnosis not present

## 2019-09-14 DIAGNOSIS — L97929 Non-pressure chronic ulcer of unspecified part of left lower leg with unspecified severity: Secondary | ICD-10-CM | POA: Diagnosis not present

## 2019-09-14 DIAGNOSIS — L97812 Non-pressure chronic ulcer of other part of right lower leg with fat layer exposed: Secondary | ICD-10-CM | POA: Diagnosis not present

## 2019-09-16 DIAGNOSIS — L97828 Non-pressure chronic ulcer of other part of left lower leg with other specified severity: Secondary | ICD-10-CM | POA: Diagnosis not present

## 2019-09-16 DIAGNOSIS — S81802D Unspecified open wound, left lower leg, subsequent encounter: Secondary | ICD-10-CM | POA: Diagnosis not present

## 2019-09-16 DIAGNOSIS — I872 Venous insufficiency (chronic) (peripheral): Secondary | ICD-10-CM | POA: Diagnosis not present

## 2019-09-16 DIAGNOSIS — S81801D Unspecified open wound, right lower leg, subsequent encounter: Secondary | ICD-10-CM | POA: Diagnosis not present

## 2019-09-16 DIAGNOSIS — Z4800 Encounter for change or removal of nonsurgical wound dressing: Secondary | ICD-10-CM | POA: Diagnosis not present

## 2019-09-16 DIAGNOSIS — L97818 Non-pressure chronic ulcer of other part of right lower leg with other specified severity: Secondary | ICD-10-CM | POA: Diagnosis not present

## 2019-09-18 DIAGNOSIS — I119 Hypertensive heart disease without heart failure: Secondary | ICD-10-CM | POA: Diagnosis not present

## 2019-09-18 DIAGNOSIS — I739 Peripheral vascular disease, unspecified: Secondary | ICD-10-CM | POA: Diagnosis not present

## 2019-09-18 DIAGNOSIS — L97212 Non-pressure chronic ulcer of right calf with fat layer exposed: Secondary | ICD-10-CM | POA: Diagnosis not present

## 2019-09-18 DIAGNOSIS — I83012 Varicose veins of right lower extremity with ulcer of calf: Secondary | ICD-10-CM | POA: Diagnosis not present

## 2019-09-18 DIAGNOSIS — L97812 Non-pressure chronic ulcer of other part of right lower leg with fat layer exposed: Secondary | ICD-10-CM | POA: Diagnosis not present

## 2019-09-18 DIAGNOSIS — Z436 Encounter for attention to other artificial openings of urinary tract: Secondary | ICD-10-CM | POA: Diagnosis not present

## 2019-09-18 DIAGNOSIS — L97818 Non-pressure chronic ulcer of other part of right lower leg with other specified severity: Secondary | ICD-10-CM | POA: Diagnosis not present

## 2019-09-18 DIAGNOSIS — L97822 Non-pressure chronic ulcer of other part of left lower leg with fat layer exposed: Secondary | ICD-10-CM | POA: Diagnosis not present

## 2019-09-18 DIAGNOSIS — Z4800 Encounter for change or removal of nonsurgical wound dressing: Secondary | ICD-10-CM | POA: Diagnosis not present

## 2019-09-18 DIAGNOSIS — Q6211 Congenital occlusion of ureteropelvic junction: Secondary | ICD-10-CM | POA: Diagnosis not present

## 2019-09-18 DIAGNOSIS — D72825 Bandemia: Secondary | ICD-10-CM | POA: Diagnosis not present

## 2019-09-18 DIAGNOSIS — I83022 Varicose veins of left lower extremity with ulcer of calf: Secondary | ICD-10-CM | POA: Diagnosis not present

## 2019-09-18 DIAGNOSIS — S81802D Unspecified open wound, left lower leg, subsequent encounter: Secondary | ICD-10-CM | POA: Diagnosis not present

## 2019-09-18 DIAGNOSIS — Z87440 Personal history of urinary (tract) infections: Secondary | ICD-10-CM | POA: Diagnosis not present

## 2019-09-18 DIAGNOSIS — I83018 Varicose veins of right lower extremity with ulcer other part of lower leg: Secondary | ICD-10-CM | POA: Diagnosis not present

## 2019-09-18 DIAGNOSIS — K4090 Unilateral inguinal hernia, without obstruction or gangrene, not specified as recurrent: Secondary | ICD-10-CM | POA: Diagnosis not present

## 2019-09-18 DIAGNOSIS — Z96652 Presence of left artificial knee joint: Secondary | ICD-10-CM | POA: Diagnosis not present

## 2019-09-18 DIAGNOSIS — I83028 Varicose veins of left lower extremity with ulcer other part of lower leg: Secondary | ICD-10-CM | POA: Diagnosis not present

## 2019-09-18 DIAGNOSIS — F0390 Unspecified dementia without behavioral disturbance: Secondary | ICD-10-CM | POA: Diagnosis not present

## 2019-09-18 DIAGNOSIS — R609 Edema, unspecified: Secondary | ICD-10-CM | POA: Diagnosis not present

## 2019-09-18 DIAGNOSIS — N281 Cyst of kidney, acquired: Secondary | ICD-10-CM | POA: Diagnosis not present

## 2019-09-18 DIAGNOSIS — I4891 Unspecified atrial fibrillation: Secondary | ICD-10-CM | POA: Diagnosis not present

## 2019-09-18 DIAGNOSIS — M810 Age-related osteoporosis without current pathological fracture: Secondary | ICD-10-CM | POA: Diagnosis not present

## 2019-09-18 DIAGNOSIS — R001 Bradycardia, unspecified: Secondary | ICD-10-CM | POA: Diagnosis not present

## 2019-09-18 DIAGNOSIS — L97222 Non-pressure chronic ulcer of left calf with fat layer exposed: Secondary | ICD-10-CM | POA: Diagnosis not present

## 2019-09-18 DIAGNOSIS — I872 Venous insufficiency (chronic) (peripheral): Secondary | ICD-10-CM | POA: Diagnosis not present

## 2019-09-18 DIAGNOSIS — D509 Iron deficiency anemia, unspecified: Secondary | ICD-10-CM | POA: Diagnosis not present

## 2019-09-18 DIAGNOSIS — Z9181 History of falling: Secondary | ICD-10-CM | POA: Diagnosis not present

## 2019-09-18 DIAGNOSIS — K219 Gastro-esophageal reflux disease without esophagitis: Secondary | ICD-10-CM | POA: Diagnosis not present

## 2019-09-21 DIAGNOSIS — L97812 Non-pressure chronic ulcer of other part of right lower leg with fat layer exposed: Secondary | ICD-10-CM | POA: Diagnosis not present

## 2019-09-21 DIAGNOSIS — L97818 Non-pressure chronic ulcer of other part of right lower leg with other specified severity: Secondary | ICD-10-CM | POA: Diagnosis not present

## 2019-09-21 DIAGNOSIS — I83028 Varicose veins of left lower extremity with ulcer other part of lower leg: Secondary | ICD-10-CM | POA: Diagnosis not present

## 2019-09-21 DIAGNOSIS — R609 Edema, unspecified: Secondary | ICD-10-CM | POA: Diagnosis not present

## 2019-09-21 DIAGNOSIS — I83018 Varicose veins of right lower extremity with ulcer other part of lower leg: Secondary | ICD-10-CM | POA: Diagnosis not present

## 2019-09-21 DIAGNOSIS — L97222 Non-pressure chronic ulcer of left calf with fat layer exposed: Secondary | ICD-10-CM | POA: Diagnosis not present

## 2019-09-21 DIAGNOSIS — I83022 Varicose veins of left lower extremity with ulcer of calf: Secondary | ICD-10-CM | POA: Diagnosis not present

## 2019-09-21 DIAGNOSIS — I872 Venous insufficiency (chronic) (peripheral): Secondary | ICD-10-CM | POA: Diagnosis not present

## 2019-09-21 DIAGNOSIS — L97828 Non-pressure chronic ulcer of other part of left lower leg with other specified severity: Secondary | ICD-10-CM | POA: Diagnosis not present

## 2019-09-21 DIAGNOSIS — L97822 Non-pressure chronic ulcer of other part of left lower leg with fat layer exposed: Secondary | ICD-10-CM | POA: Diagnosis not present

## 2019-09-21 DIAGNOSIS — L97212 Non-pressure chronic ulcer of right calf with fat layer exposed: Secondary | ICD-10-CM | POA: Diagnosis not present

## 2019-09-21 DIAGNOSIS — I83012 Varicose veins of right lower extremity with ulcer of calf: Secondary | ICD-10-CM | POA: Diagnosis not present

## 2019-09-23 ENCOUNTER — Other Ambulatory Visit: Payer: Self-pay | Admitting: *Deleted

## 2019-09-23 DIAGNOSIS — I872 Venous insufficiency (chronic) (peripheral): Secondary | ICD-10-CM | POA: Diagnosis not present

## 2019-09-23 DIAGNOSIS — S81802D Unspecified open wound, left lower leg, subsequent encounter: Secondary | ICD-10-CM | POA: Diagnosis not present

## 2019-09-23 DIAGNOSIS — Z436 Encounter for attention to other artificial openings of urinary tract: Secondary | ICD-10-CM | POA: Diagnosis not present

## 2019-09-23 DIAGNOSIS — Q6211 Congenital occlusion of ureteropelvic junction: Secondary | ICD-10-CM | POA: Diagnosis not present

## 2019-09-23 DIAGNOSIS — Z4800 Encounter for change or removal of nonsurgical wound dressing: Secondary | ICD-10-CM | POA: Diagnosis not present

## 2019-09-23 DIAGNOSIS — L97818 Non-pressure chronic ulcer of other part of right lower leg with other specified severity: Secondary | ICD-10-CM | POA: Diagnosis not present

## 2019-09-23 MED ORDER — LORAZEPAM 1 MG PO TABS: 1.0000 mg | ORAL_TABLET | ORAL | 0 refills | Status: DC

## 2019-09-28 ENCOUNTER — Telehealth: Payer: Self-pay | Admitting: Internal Medicine

## 2019-09-28 DIAGNOSIS — I872 Venous insufficiency (chronic) (peripheral): Secondary | ICD-10-CM | POA: Diagnosis not present

## 2019-09-28 DIAGNOSIS — R609 Edema, unspecified: Secondary | ICD-10-CM | POA: Diagnosis not present

## 2019-09-28 DIAGNOSIS — S81802D Unspecified open wound, left lower leg, subsequent encounter: Secondary | ICD-10-CM | POA: Diagnosis not present

## 2019-09-28 DIAGNOSIS — I83018 Varicose veins of right lower extremity with ulcer other part of lower leg: Secondary | ICD-10-CM | POA: Diagnosis not present

## 2019-09-28 DIAGNOSIS — I83893 Varicose veins of bilateral lower extremities with other complications: Secondary | ICD-10-CM | POA: Diagnosis not present

## 2019-09-28 DIAGNOSIS — L97812 Non-pressure chronic ulcer of other part of right lower leg with fat layer exposed: Secondary | ICD-10-CM | POA: Diagnosis not present

## 2019-09-28 DIAGNOSIS — S81801D Unspecified open wound, right lower leg, subsequent encounter: Secondary | ICD-10-CM | POA: Diagnosis not present

## 2019-09-28 DIAGNOSIS — I83028 Varicose veins of left lower extremity with ulcer other part of lower leg: Secondary | ICD-10-CM | POA: Diagnosis not present

## 2019-09-28 DIAGNOSIS — L97822 Non-pressure chronic ulcer of other part of left lower leg with fat layer exposed: Secondary | ICD-10-CM | POA: Diagnosis not present

## 2019-09-28 NOTE — Telephone Encounter (Signed)
Received Fax RX request from  [place]  Medication - OLANZapine (ZYPREXA) 5 MG tablet   Last Refill - 08/28/2019  Last OV - 06/26/19  Last CPE - 06/26/19

## 2019-09-30 ENCOUNTER — Other Ambulatory Visit: Payer: Self-pay

## 2019-09-30 ENCOUNTER — Encounter: Payer: Self-pay | Admitting: Vascular Surgery

## 2019-09-30 ENCOUNTER — Ambulatory Visit (INDEPENDENT_AMBULATORY_CARE_PROVIDER_SITE_OTHER): Payer: Medicare Other | Admitting: Vascular Surgery

## 2019-09-30 VITALS — BP 136/86 | HR 86 | Temp 97.1°F | Resp 16 | Ht 66.0 in | Wt 144.9 lb

## 2019-09-30 DIAGNOSIS — S81802D Unspecified open wound, left lower leg, subsequent encounter: Secondary | ICD-10-CM | POA: Diagnosis not present

## 2019-09-30 DIAGNOSIS — I83811 Varicose veins of right lower extremities with pain: Secondary | ICD-10-CM | POA: Diagnosis not present

## 2019-09-30 DIAGNOSIS — Z4800 Encounter for change or removal of nonsurgical wound dressing: Secondary | ICD-10-CM | POA: Diagnosis not present

## 2019-09-30 DIAGNOSIS — L97818 Non-pressure chronic ulcer of other part of right lower leg with other specified severity: Secondary | ICD-10-CM | POA: Diagnosis not present

## 2019-09-30 DIAGNOSIS — Q6211 Congenital occlusion of ureteropelvic junction: Secondary | ICD-10-CM | POA: Diagnosis not present

## 2019-09-30 DIAGNOSIS — I872 Venous insufficiency (chronic) (peripheral): Secondary | ICD-10-CM | POA: Diagnosis not present

## 2019-09-30 DIAGNOSIS — Z436 Encounter for attention to other artificial openings of urinary tract: Secondary | ICD-10-CM | POA: Diagnosis not present

## 2019-09-30 HISTORY — PX: ENDOVENOUS ABLATION SAPHENOUS VEIN W/ LASER: SUR449

## 2019-09-30 NOTE — Progress Notes (Signed)
     Laser Ablation Procedure    Date: 09/30/2019   Donna Woods DOB:08-Jul-1934  Consent signed: Yes    Surgeon: Ruta Hinds MD   Procedure: Laser Ablation: right Greater Saphenous Vein  BP 136/86 (BP Location: Left Arm, Patient Position: Sitting, Cuff Size: Normal)   Pulse 86   Temp (!) 97.1 F (36.2 C) (Temporal)   Resp 16   Ht 5\' 6"  (1.676 m)   Wt 144 lb 14.4 oz (65.7 kg)   SpO2 97%   BMI 23.39 kg/m   Tumescent Anesthesia: 325 cc 0.9% NaCl with 50 cc Lidocaine HCL 1%  and 15 cc 8.4% NaHCO3  Local Anesthesia: 3.5 cc Lidocaine HCL and NaHCO3 (ratio 2:1)  15 watts continuous mode        Total energy: 2246 Joules   Total time: 2:29  Laser Fiber Ref. # [id number]      Lot # [id number]     Patient tolerated procedure well  Notes: Patient wore face mask.  All staff members wore facial masks and facial shields/goggles.  [name] took Ativan 1 mg on 09-30-2019 at 1:30 PM.    Description of Procedure:  After marking the course of the secondary varicosities, the patient was placed on the operating table in the supine position, and the right leg was prepped and draped in sterile fashion.   Local anesthetic was administered and under ultrasound guidance the saphenous vein was accessed with a micro needle and guide wire; then the mirco puncture sheath was placed.  A guide wire was inserted saphenofemoral junction , followed by a 5 french sheath.  The position of the sheath and then the laser fiber below the junction was confirmed using the ultrasound.  Tumescent anesthesia was administered along the course of the saphenous vein using ultrasound guidance. The patient was placed in Trendelenburg position and protective laser glasses were placed on patient and staff, and the laser was fired at 15 watts continuous mode advancing 1-64mm/second for a total of 2246 joules.       Steri strip was applied to the IV insertion site and ABD pads and thigh high compression stockings were  applied.  Ace wrap bandages were applied at the top of the saphenofemoral junction. Blood loss was less than 15 cc.  Discharge instructions reviewed with patient and hardcopy of discharge instructions given to patient to take home. The patient ambulated out of the operating room having tolerated the procedure well.  Ruta Hinds, MD [facility] Office: [phone]

## 2019-10-01 ENCOUNTER — Encounter: Payer: Self-pay | Admitting: Vascular Surgery

## 2019-10-05 DIAGNOSIS — R609 Edema, unspecified: Secondary | ICD-10-CM | POA: Diagnosis not present

## 2019-10-05 DIAGNOSIS — L97919 Non-pressure chronic ulcer of unspecified part of right lower leg with unspecified severity: Secondary | ICD-10-CM | POA: Diagnosis not present

## 2019-10-05 DIAGNOSIS — I83891 Varicose veins of right lower extremities with other complications: Secondary | ICD-10-CM | POA: Diagnosis not present

## 2019-10-05 DIAGNOSIS — L97812 Non-pressure chronic ulcer of other part of right lower leg with fat layer exposed: Secondary | ICD-10-CM | POA: Diagnosis not present

## 2019-10-05 DIAGNOSIS — I83019 Varicose veins of right lower extremity with ulcer of unspecified site: Secondary | ICD-10-CM | POA: Diagnosis not present

## 2019-10-05 DIAGNOSIS — I872 Venous insufficiency (chronic) (peripheral): Secondary | ICD-10-CM | POA: Diagnosis not present

## 2019-10-05 DIAGNOSIS — I83029 Varicose veins of left lower extremity with ulcer of unspecified site: Secondary | ICD-10-CM | POA: Diagnosis not present

## 2019-10-05 DIAGNOSIS — L97822 Non-pressure chronic ulcer of other part of left lower leg with fat layer exposed: Secondary | ICD-10-CM | POA: Diagnosis not present

## 2019-10-05 DIAGNOSIS — L97222 Non-pressure chronic ulcer of left calf with fat layer exposed: Secondary | ICD-10-CM | POA: Diagnosis not present

## 2019-10-05 DIAGNOSIS — F0390 Unspecified dementia without behavioral disturbance: Secondary | ICD-10-CM | POA: Diagnosis not present

## 2019-10-05 DIAGNOSIS — L97929 Non-pressure chronic ulcer of unspecified part of left lower leg with unspecified severity: Secondary | ICD-10-CM | POA: Diagnosis not present

## 2019-10-05 DIAGNOSIS — I83892 Varicose veins of left lower extremities with other complications: Secondary | ICD-10-CM | POA: Diagnosis not present

## 2019-10-06 ENCOUNTER — Telehealth (HOSPITAL_COMMUNITY): Payer: Self-pay

## 2019-10-06 NOTE — Telephone Encounter (Signed)

## 2019-10-07 ENCOUNTER — Ambulatory Visit (INDEPENDENT_AMBULATORY_CARE_PROVIDER_SITE_OTHER): Payer: Self-pay | Admitting: Vascular Surgery

## 2019-10-07 ENCOUNTER — Encounter: Payer: Self-pay | Admitting: Vascular Surgery

## 2019-10-07 ENCOUNTER — Ambulatory Visit (HOSPITAL_COMMUNITY)
Admission: RE | Admit: 2019-10-07 | Discharge: 2019-10-07 | Disposition: A | Discharge status: Home / Self Care | Payer: Medicare Other | Source: Ambulatory Visit | Attending: Vascular Surgery | Admitting: Vascular Surgery

## 2019-10-07 ENCOUNTER — Other Ambulatory Visit: Payer: Self-pay

## 2019-10-07 VITALS — BP 133/67 | HR 53 | Temp 97.0°F | Resp 16 | Ht 66.0 in | Wt 144.9 lb

## 2019-10-07 DIAGNOSIS — I83019 Varicose veins of right lower extremity with ulcer of unspecified site: Secondary | ICD-10-CM | POA: Diagnosis not present

## 2019-10-07 DIAGNOSIS — S81802D Unspecified open wound, left lower leg, subsequent encounter: Secondary | ICD-10-CM | POA: Diagnosis not present

## 2019-10-07 DIAGNOSIS — L97919 Non-pressure chronic ulcer of unspecified part of right lower leg with unspecified severity: Secondary | ICD-10-CM | POA: Insufficient documentation

## 2019-10-07 DIAGNOSIS — Z436 Encounter for attention to other artificial openings of urinary tract: Secondary | ICD-10-CM | POA: Diagnosis not present

## 2019-10-07 DIAGNOSIS — I872 Venous insufficiency (chronic) (peripheral): Secondary | ICD-10-CM | POA: Diagnosis not present

## 2019-10-07 DIAGNOSIS — I83813 Varicose veins of bilateral lower extremities with pain: Secondary | ICD-10-CM

## 2019-10-07 DIAGNOSIS — L97818 Non-pressure chronic ulcer of other part of right lower leg with other specified severity: Secondary | ICD-10-CM | POA: Diagnosis not present

## 2019-10-07 DIAGNOSIS — Q6211 Congenital occlusion of ureteropelvic junction: Secondary | ICD-10-CM | POA: Diagnosis not present

## 2019-10-07 DIAGNOSIS — Z4800 Encounter for change or removal of nonsurgical wound dressing: Secondary | ICD-10-CM | POA: Diagnosis not present

## 2019-10-07 NOTE — Progress Notes (Signed)
Patient is an 85 year old female who is status post laser ablation of her right greater saphenous vein last week.  She reports minimal pain symptoms.  She has no shortness of breath or chest pain.  She is still being followed by Dr. Welton Flakes at the wound center in [facility].  She states the wounds on her left leg are improving.  She still has fairly extensive wounds on the right leg.  Physical exam:  Vitals:   10/07/19 1332  BP: 133/67  Pulse: (!) 53  Resp: 16  Temp: (!) 97 F (36.1 C)  TempSrc: Temporal  SpO2: 100%  Weight: 144 lb 14.4 oz (65.7 kg)  Height: 5\' 6"  (1.676 m)    Right lower extremity: Healing skin incision right leg minimal tenderness right thigh  Data: Duplex ultrasound shows no evidence of DVT successful closure of the right greater saphenous vein  Assessment: Patient doing well status post laser ablation right greater saphenous vein.  She does have reflux in the left greater saphenous vein as well.  We discussed today the possibility of laser ablation of the left leg.  However, the patient states that she thinks the wounds in her left leg are nearly healed and she does not need a laser ablation at this point.  Plan: The patient will follow-up with [place] on an as-needed basis if she desires radiology or ablation in the left leg at some point in future for assistance in wound healing.  Hopefully the right leg will continue to heal now that the greater saphenous vein is out of the equation.  Ruta Hinds, MD [facility] Office: [phone]

## 2019-10-09 DIAGNOSIS — L97812 Non-pressure chronic ulcer of other part of right lower leg with fat layer exposed: Secondary | ICD-10-CM | POA: Diagnosis not present

## 2019-10-09 DIAGNOSIS — I83028 Varicose veins of left lower extremity with ulcer other part of lower leg: Secondary | ICD-10-CM | POA: Diagnosis not present

## 2019-10-09 DIAGNOSIS — R609 Edema, unspecified: Secondary | ICD-10-CM | POA: Diagnosis not present

## 2019-10-09 DIAGNOSIS — I872 Venous insufficiency (chronic) (peripheral): Secondary | ICD-10-CM | POA: Diagnosis not present

## 2019-10-09 DIAGNOSIS — I83018 Varicose veins of right lower extremity with ulcer other part of lower leg: Secondary | ICD-10-CM | POA: Diagnosis not present

## 2019-10-09 DIAGNOSIS — L97822 Non-pressure chronic ulcer of other part of left lower leg with fat layer exposed: Secondary | ICD-10-CM | POA: Diagnosis not present

## 2019-10-12 DIAGNOSIS — L97812 Non-pressure chronic ulcer of other part of right lower leg with fat layer exposed: Secondary | ICD-10-CM | POA: Diagnosis not present

## 2019-10-12 DIAGNOSIS — R609 Edema, unspecified: Secondary | ICD-10-CM | POA: Diagnosis not present

## 2019-10-12 DIAGNOSIS — I83028 Varicose veins of left lower extremity with ulcer other part of lower leg: Secondary | ICD-10-CM | POA: Diagnosis not present

## 2019-10-12 DIAGNOSIS — I83891 Varicose veins of right lower extremities with other complications: Secondary | ICD-10-CM | POA: Diagnosis not present

## 2019-10-12 DIAGNOSIS — I83022 Varicose veins of left lower extremity with ulcer of calf: Secondary | ICD-10-CM | POA: Diagnosis not present

## 2019-10-12 DIAGNOSIS — L97212 Non-pressure chronic ulcer of right calf with fat layer exposed: Secondary | ICD-10-CM | POA: Diagnosis not present

## 2019-10-12 DIAGNOSIS — I83012 Varicose veins of right lower extremity with ulcer of calf: Secondary | ICD-10-CM | POA: Diagnosis not present

## 2019-10-12 DIAGNOSIS — L97222 Non-pressure chronic ulcer of left calf with fat layer exposed: Secondary | ICD-10-CM | POA: Diagnosis not present

## 2019-10-12 DIAGNOSIS — L97822 Non-pressure chronic ulcer of other part of left lower leg with fat layer exposed: Secondary | ICD-10-CM | POA: Diagnosis not present

## 2019-10-12 DIAGNOSIS — I83018 Varicose veins of right lower extremity with ulcer other part of lower leg: Secondary | ICD-10-CM | POA: Diagnosis not present

## 2019-10-12 DIAGNOSIS — I872 Venous insufficiency (chronic) (peripheral): Secondary | ICD-10-CM | POA: Diagnosis not present

## 2019-10-14 DIAGNOSIS — Z436 Encounter for attention to other artificial openings of urinary tract: Secondary | ICD-10-CM | POA: Diagnosis not present

## 2019-10-14 DIAGNOSIS — L97818 Non-pressure chronic ulcer of other part of right lower leg with other specified severity: Secondary | ICD-10-CM | POA: Diagnosis not present

## 2019-10-14 DIAGNOSIS — Q6211 Congenital occlusion of ureteropelvic junction: Secondary | ICD-10-CM | POA: Diagnosis not present

## 2019-10-14 DIAGNOSIS — Z4800 Encounter for change or removal of nonsurgical wound dressing: Secondary | ICD-10-CM | POA: Diagnosis not present

## 2019-10-14 DIAGNOSIS — I872 Venous insufficiency (chronic) (peripheral): Secondary | ICD-10-CM | POA: Diagnosis not present

## 2019-10-14 DIAGNOSIS — S81802D Unspecified open wound, left lower leg, subsequent encounter: Secondary | ICD-10-CM | POA: Diagnosis not present

## 2019-10-16 DIAGNOSIS — L97818 Non-pressure chronic ulcer of other part of right lower leg with other specified severity: Secondary | ICD-10-CM | POA: Diagnosis not present

## 2019-10-16 DIAGNOSIS — Q6211 Congenital occlusion of ureteropelvic junction: Secondary | ICD-10-CM | POA: Diagnosis not present

## 2019-10-16 DIAGNOSIS — S81802D Unspecified open wound, left lower leg, subsequent encounter: Secondary | ICD-10-CM | POA: Diagnosis not present

## 2019-10-16 DIAGNOSIS — Z436 Encounter for attention to other artificial openings of urinary tract: Secondary | ICD-10-CM | POA: Diagnosis not present

## 2019-10-16 DIAGNOSIS — Z4800 Encounter for change or removal of nonsurgical wound dressing: Secondary | ICD-10-CM | POA: Diagnosis not present

## 2019-10-16 DIAGNOSIS — I872 Venous insufficiency (chronic) (peripheral): Secondary | ICD-10-CM | POA: Diagnosis not present

## 2019-10-18 DIAGNOSIS — Z96652 Presence of left artificial knee joint: Secondary | ICD-10-CM | POA: Diagnosis not present

## 2019-10-18 DIAGNOSIS — Z9181 History of falling: Secondary | ICD-10-CM | POA: Diagnosis not present

## 2019-10-18 DIAGNOSIS — S81802D Unspecified open wound, left lower leg, subsequent encounter: Secondary | ICD-10-CM | POA: Diagnosis not present

## 2019-10-18 DIAGNOSIS — R001 Bradycardia, unspecified: Secondary | ICD-10-CM | POA: Diagnosis not present

## 2019-10-18 DIAGNOSIS — Z4800 Encounter for change or removal of nonsurgical wound dressing: Secondary | ICD-10-CM | POA: Diagnosis not present

## 2019-10-18 DIAGNOSIS — L97818 Non-pressure chronic ulcer of other part of right lower leg with other specified severity: Secondary | ICD-10-CM | POA: Diagnosis not present

## 2019-10-18 DIAGNOSIS — Z87440 Personal history of urinary (tract) infections: Secondary | ICD-10-CM | POA: Diagnosis not present

## 2019-10-18 DIAGNOSIS — D509 Iron deficiency anemia, unspecified: Secondary | ICD-10-CM | POA: Diagnosis not present

## 2019-10-18 DIAGNOSIS — K4090 Unilateral inguinal hernia, without obstruction or gangrene, not specified as recurrent: Secondary | ICD-10-CM | POA: Diagnosis not present

## 2019-10-18 DIAGNOSIS — Q6211 Congenital occlusion of ureteropelvic junction: Secondary | ICD-10-CM | POA: Diagnosis not present

## 2019-10-18 DIAGNOSIS — I4891 Unspecified atrial fibrillation: Secondary | ICD-10-CM | POA: Diagnosis not present

## 2019-10-18 DIAGNOSIS — M810 Age-related osteoporosis without current pathological fracture: Secondary | ICD-10-CM | POA: Diagnosis not present

## 2019-10-18 DIAGNOSIS — D72825 Bandemia: Secondary | ICD-10-CM | POA: Diagnosis not present

## 2019-10-18 DIAGNOSIS — I872 Venous insufficiency (chronic) (peripheral): Secondary | ICD-10-CM | POA: Diagnosis not present

## 2019-10-18 DIAGNOSIS — Z436 Encounter for attention to other artificial openings of urinary tract: Secondary | ICD-10-CM | POA: Diagnosis not present

## 2019-10-18 DIAGNOSIS — F0390 Unspecified dementia without behavioral disturbance: Secondary | ICD-10-CM | POA: Diagnosis not present

## 2019-10-18 DIAGNOSIS — I739 Peripheral vascular disease, unspecified: Secondary | ICD-10-CM | POA: Diagnosis not present

## 2019-10-18 DIAGNOSIS — N281 Cyst of kidney, acquired: Secondary | ICD-10-CM | POA: Diagnosis not present

## 2019-10-18 DIAGNOSIS — K219 Gastro-esophageal reflux disease without esophagitis: Secondary | ICD-10-CM | POA: Diagnosis not present

## 2019-10-18 DIAGNOSIS — I119 Hypertensive heart disease without heart failure: Secondary | ICD-10-CM | POA: Diagnosis not present

## 2019-10-19 DIAGNOSIS — I83891 Varicose veins of right lower extremities with other complications: Secondary | ICD-10-CM | POA: Diagnosis not present

## 2019-10-19 DIAGNOSIS — I83019 Varicose veins of right lower extremity with ulcer of unspecified site: Secondary | ICD-10-CM | POA: Diagnosis not present

## 2019-10-19 DIAGNOSIS — I83892 Varicose veins of left lower extremities with other complications: Secondary | ICD-10-CM | POA: Diagnosis not present

## 2019-10-19 DIAGNOSIS — R609 Edema, unspecified: Secondary | ICD-10-CM | POA: Diagnosis not present

## 2019-10-19 DIAGNOSIS — L97929 Non-pressure chronic ulcer of unspecified part of left lower leg with unspecified severity: Secondary | ICD-10-CM | POA: Diagnosis not present

## 2019-10-19 DIAGNOSIS — L97812 Non-pressure chronic ulcer of other part of right lower leg with fat layer exposed: Secondary | ICD-10-CM | POA: Diagnosis not present

## 2019-10-19 DIAGNOSIS — L97222 Non-pressure chronic ulcer of left calf with fat layer exposed: Secondary | ICD-10-CM | POA: Diagnosis not present

## 2019-10-19 DIAGNOSIS — L97919 Non-pressure chronic ulcer of unspecified part of right lower leg with unspecified severity: Secondary | ICD-10-CM | POA: Diagnosis not present

## 2019-10-19 DIAGNOSIS — I83029 Varicose veins of left lower extremity with ulcer of unspecified site: Secondary | ICD-10-CM | POA: Diagnosis not present

## 2019-10-19 DIAGNOSIS — I872 Venous insufficiency (chronic) (peripheral): Secondary | ICD-10-CM | POA: Diagnosis not present

## 2019-10-21 DIAGNOSIS — I872 Venous insufficiency (chronic) (peripheral): Secondary | ICD-10-CM | POA: Diagnosis not present

## 2019-10-21 DIAGNOSIS — Z436 Encounter for attention to other artificial openings of urinary tract: Secondary | ICD-10-CM | POA: Diagnosis not present

## 2019-10-21 DIAGNOSIS — Q6211 Congenital occlusion of ureteropelvic junction: Secondary | ICD-10-CM | POA: Diagnosis not present

## 2019-10-21 DIAGNOSIS — S81802D Unspecified open wound, left lower leg, subsequent encounter: Secondary | ICD-10-CM | POA: Diagnosis not present

## 2019-10-21 DIAGNOSIS — L97818 Non-pressure chronic ulcer of other part of right lower leg with other specified severity: Secondary | ICD-10-CM | POA: Diagnosis not present

## 2019-10-21 DIAGNOSIS — Z4800 Encounter for change or removal of nonsurgical wound dressing: Secondary | ICD-10-CM | POA: Diagnosis not present

## 2019-10-26 DIAGNOSIS — L97822 Non-pressure chronic ulcer of other part of left lower leg with fat layer exposed: Secondary | ICD-10-CM | POA: Diagnosis not present

## 2019-10-26 DIAGNOSIS — L97919 Non-pressure chronic ulcer of unspecified part of right lower leg with unspecified severity: Secondary | ICD-10-CM | POA: Diagnosis not present

## 2019-10-26 DIAGNOSIS — I83028 Varicose veins of left lower extremity with ulcer other part of lower leg: Secondary | ICD-10-CM | POA: Diagnosis not present

## 2019-10-26 DIAGNOSIS — I83892 Varicose veins of left lower extremities with other complications: Secondary | ICD-10-CM | POA: Diagnosis not present

## 2019-10-26 DIAGNOSIS — R609 Edema, unspecified: Secondary | ICD-10-CM | POA: Diagnosis not present

## 2019-10-26 DIAGNOSIS — Z4800 Encounter for change or removal of nonsurgical wound dressing: Secondary | ICD-10-CM | POA: Diagnosis not present

## 2019-10-26 DIAGNOSIS — I83019 Varicose veins of right lower extremity with ulcer of unspecified site: Secondary | ICD-10-CM | POA: Diagnosis not present

## 2019-10-26 DIAGNOSIS — L97812 Non-pressure chronic ulcer of other part of right lower leg with fat layer exposed: Secondary | ICD-10-CM | POA: Diagnosis not present

## 2019-10-26 DIAGNOSIS — I872 Venous insufficiency (chronic) (peripheral): Secondary | ICD-10-CM | POA: Diagnosis not present

## 2019-10-26 DIAGNOSIS — I83891 Varicose veins of right lower extremities with other complications: Secondary | ICD-10-CM | POA: Diagnosis not present

## 2019-10-26 DIAGNOSIS — R600 Localized edema: Secondary | ICD-10-CM | POA: Diagnosis not present

## 2019-10-26 DIAGNOSIS — L97929 Non-pressure chronic ulcer of unspecified part of left lower leg with unspecified severity: Secondary | ICD-10-CM | POA: Diagnosis not present

## 2019-10-26 DIAGNOSIS — L97222 Non-pressure chronic ulcer of left calf with fat layer exposed: Secondary | ICD-10-CM | POA: Diagnosis not present

## 2019-10-26 DIAGNOSIS — I83018 Varicose veins of right lower extremity with ulcer other part of lower leg: Secondary | ICD-10-CM | POA: Diagnosis not present

## 2019-10-26 DIAGNOSIS — I83029 Varicose veins of left lower extremity with ulcer of unspecified site: Secondary | ICD-10-CM | POA: Diagnosis not present

## 2019-10-29 DIAGNOSIS — H25013 Cortical age-related cataract, bilateral: Secondary | ICD-10-CM | POA: Diagnosis not present

## 2019-10-29 DIAGNOSIS — H2512 Age-related nuclear cataract, left eye: Secondary | ICD-10-CM | POA: Diagnosis not present

## 2019-10-29 DIAGNOSIS — H25043 Posterior subcapsular polar age-related cataract, bilateral: Secondary | ICD-10-CM | POA: Diagnosis not present

## 2019-10-29 DIAGNOSIS — H2513 Age-related nuclear cataract, bilateral: Secondary | ICD-10-CM | POA: Diagnosis not present

## 2019-10-29 DIAGNOSIS — H401131 Primary open-angle glaucoma, bilateral, mild stage: Secondary | ICD-10-CM | POA: Diagnosis not present

## 2019-11-02 ENCOUNTER — Encounter: Payer: Self-pay | Admitting: Vascular Surgery

## 2019-11-02 DIAGNOSIS — I83019 Varicose veins of right lower extremity with ulcer of unspecified site: Secondary | ICD-10-CM | POA: Diagnosis not present

## 2019-11-02 DIAGNOSIS — I83891 Varicose veins of right lower extremities with other complications: Secondary | ICD-10-CM | POA: Diagnosis not present

## 2019-11-02 DIAGNOSIS — L97822 Non-pressure chronic ulcer of other part of left lower leg with fat layer exposed: Secondary | ICD-10-CM | POA: Diagnosis not present

## 2019-11-02 DIAGNOSIS — L97222 Non-pressure chronic ulcer of left calf with fat layer exposed: Secondary | ICD-10-CM | POA: Diagnosis not present

## 2019-11-02 DIAGNOSIS — L97919 Non-pressure chronic ulcer of unspecified part of right lower leg with unspecified severity: Secondary | ICD-10-CM | POA: Diagnosis not present

## 2019-11-02 DIAGNOSIS — I83892 Varicose veins of left lower extremities with other complications: Secondary | ICD-10-CM | POA: Diagnosis not present

## 2019-11-02 DIAGNOSIS — I872 Venous insufficiency (chronic) (peripheral): Secondary | ICD-10-CM | POA: Diagnosis not present

## 2019-11-02 DIAGNOSIS — R609 Edema, unspecified: Secondary | ICD-10-CM | POA: Diagnosis not present

## 2019-11-02 DIAGNOSIS — I83029 Varicose veins of left lower extremity with ulcer of unspecified site: Secondary | ICD-10-CM | POA: Diagnosis not present

## 2019-11-02 DIAGNOSIS — L97929 Non-pressure chronic ulcer of unspecified part of left lower leg with unspecified severity: Secondary | ICD-10-CM | POA: Diagnosis not present

## 2019-11-02 DIAGNOSIS — L97812 Non-pressure chronic ulcer of other part of right lower leg with fat layer exposed: Secondary | ICD-10-CM | POA: Diagnosis not present

## 2019-11-27 ENCOUNTER — Other Ambulatory Visit: Payer: Self-pay | Admitting: Cardiovascular Disease

## 2019-12-22 ENCOUNTER — Other Ambulatory Visit: Payer: Medicare Other | Admitting: Internal Medicine

## 2019-12-29 ENCOUNTER — Other Ambulatory Visit: Payer: Self-pay

## 2019-12-29 ENCOUNTER — Ambulatory Visit: Payer: Medicare Other | Admitting: Internal Medicine

## 2019-12-29 ENCOUNTER — Other Ambulatory Visit: Payer: Medicare Other | Admitting: Internal Medicine

## 2019-12-29 DIAGNOSIS — I10 Essential (primary) hypertension: Secondary | ICD-10-CM

## 2019-12-29 DIAGNOSIS — M810 Age-related osteoporosis without current pathological fracture: Secondary | ICD-10-CM

## 2019-12-29 DIAGNOSIS — I493 Ventricular premature depolarization: Secondary | ICD-10-CM

## 2019-12-29 DIAGNOSIS — Z0000 Encounter for general adult medical examination without abnormal findings: Secondary | ICD-10-CM

## 2019-12-29 DIAGNOSIS — R7302 Impaired glucose tolerance (oral): Secondary | ICD-10-CM

## 2019-12-29 DIAGNOSIS — R413 Other amnesia: Secondary | ICD-10-CM

## 2019-12-29 DIAGNOSIS — E782 Mixed hyperlipidemia: Secondary | ICD-10-CM

## 2019-12-29 DIAGNOSIS — Z1329 Encounter for screening for other suspected endocrine disorder: Secondary | ICD-10-CM

## 2019-12-30 LAB — CBC WITH DIFFERENTIAL/PLATELET
Absolute Monocytes: 735 cells/uL (ref 200–950)
Basophils Absolute: 113 cells/uL (ref 0–200)
Basophils Relative: 1 %
Eosinophils Absolute: 384 cells/uL (ref 15–500)
Eosinophils Relative: 3.4 %
HCT: 34 % — ABNORMAL LOW (ref 35.0–45.0)
Hemoglobin: 11.4 g/dL — ABNORMAL LOW (ref 11.7–15.5)
Lymphs Abs: 2034 cells/uL (ref 850–3900)
MCH: 31.7 pg (ref 27.0–33.0)
MCHC: 33.5 g/dL (ref 32.0–36.0)
MCV: 94.4 fL (ref 80.0–100.0)
MPV: 11.1 fL (ref 7.5–12.5)
Monocytes Relative: 6.5 %
Neutro Abs: 8034 cells/uL — ABNORMAL HIGH (ref 1500–7800)
Neutrophils Relative %: 71.1 %
Platelets: 248 10*3/uL (ref 140–400)
RBC: 3.6 10*6/uL — ABNORMAL LOW (ref 3.80–5.10)
RDW: 11.8 % (ref 11.0–15.0)
Total Lymphocyte: 18 %
WBC: 11.3 10*3/uL — ABNORMAL HIGH (ref 3.8–10.8)

## 2019-12-30 LAB — COMPLETE METABOLIC PANEL WITH GFR
AG Ratio: 1.4 (calc) (ref 1.0–2.5)
ALT: 7 U/L (ref 6–29)
AST: 7 U/L — ABNORMAL LOW (ref 10–35)
Albumin: 4.1 g/dL (ref 3.6–5.1)
Alkaline phosphatase (APISO): 61 U/L (ref 37–153)
BUN/Creatinine Ratio: 33 (calc) — ABNORMAL HIGH (ref 6–22)
BUN: 43 mg/dL — ABNORMAL HIGH (ref 7–25)
CO2: 24 mmol/L (ref 20–32)
Calcium: 9.6 mg/dL (ref 8.6–10.4)
Chloride: 109 mmol/L (ref 98–110)
Creat: 1.29 mg/dL — ABNORMAL HIGH (ref 0.60–0.88)
GFR, Est African American: 44 mL/min/{1.73_m2} — ABNORMAL LOW (ref >=60)
GFR, Est Non African American: 38 mL/min/{1.73_m2} — ABNORMAL LOW (ref >=60)
Globulin: 2.9 g/dL (calc) (ref 1.9–3.7)
Glucose, Bld: 106 mg/dL — ABNORMAL HIGH (ref 65–99)
Potassium: 4.2 mmol/L (ref 3.5–5.3)
Sodium: 145 mmol/L (ref 135–146)
Total Bilirubin: 0.3 mg/dL (ref 0.2–1.2)
Total Protein: 7 g/dL (ref 6.1–8.1)

## 2019-12-30 LAB — TSH: TSH: 2.24 mIU/L (ref 0.40–4.50)

## 2019-12-30 LAB — LIPID PANEL
Cholesterol: 145 mg/dL (ref <200)
HDL: 50 mg/dL (ref >=50)
LDL Cholesterol (Calc): 77 mg/dL (calc)
Non-HDL Cholesterol (Calc): 95 mg/dL (calc) (ref <130)
Total CHOL/HDL Ratio: 2.9 (calc) (ref <5.0)
Triglycerides: 92 mg/dL (ref <150)

## 2019-12-30 LAB — HEMOGLOBIN A1C
Hgb A1c MFr Bld: 5.7 % of total Hgb — ABNORMAL HIGH (ref <5.7)
Mean Plasma Glucose: 117 (calc)
eAG (mmol/L): 6.5 (calc)

## 2019-12-31 ENCOUNTER — Ambulatory Visit (INDEPENDENT_AMBULATORY_CARE_PROVIDER_SITE_OTHER): Payer: Medicare Other | Admitting: Internal Medicine

## 2019-12-31 ENCOUNTER — Encounter: Payer: Self-pay | Admitting: Internal Medicine

## 2019-12-31 ENCOUNTER — Other Ambulatory Visit: Payer: Self-pay

## 2019-12-31 VITALS — BP 110/60 | HR 44 | Temp 98.0°F | Ht 66.0 in | Wt 143.0 lb

## 2019-12-31 DIAGNOSIS — I10 Essential (primary) hypertension: Secondary | ICD-10-CM

## 2019-12-31 DIAGNOSIS — N1831 Chronic kidney disease, stage 3a: Secondary | ICD-10-CM

## 2019-12-31 DIAGNOSIS — R413 Other amnesia: Secondary | ICD-10-CM | POA: Diagnosis not present

## 2019-12-31 DIAGNOSIS — L97919 Non-pressure chronic ulcer of unspecified part of right lower leg with unspecified severity: Secondary | ICD-10-CM

## 2019-12-31 DIAGNOSIS — L97929 Non-pressure chronic ulcer of unspecified part of left lower leg with unspecified severity: Secondary | ICD-10-CM

## 2019-12-31 DIAGNOSIS — I493 Ventricular premature depolarization: Secondary | ICD-10-CM

## 2019-12-31 DIAGNOSIS — E782 Mixed hyperlipidemia: Secondary | ICD-10-CM | POA: Diagnosis not present

## 2019-12-31 DIAGNOSIS — R7302 Impaired glucose tolerance (oral): Secondary | ICD-10-CM

## 2019-12-31 DIAGNOSIS — I878 Other specified disorders of veins: Secondary | ICD-10-CM

## 2019-12-31 DIAGNOSIS — M810 Age-related osteoporosis without current pathological fracture: Secondary | ICD-10-CM

## 2019-12-31 NOTE — Progress Notes (Signed)
   Subjective:    Patient ID: Donna Woods, female    DOB: 05/14/1934, 86 y.o.   MRN: [id number]  HPI she is now 86 years old and is brought in by her husband.  She is here for 6-month recheck.  She has chronic kidney disease.  Creatinine is 1.29, 6 months ago 0.89 and 8 months ago 1.45.  Lipid panel and TSH are normal.  She has memory disorder.  It is about the same.  She is going to wound care center in [place] for chronic leg wound treatment.  Husband thinks she is doing a bit better with this.  History of bradycardia, longstanding and is just being monitored.  Memory loss dates back to 2010 when she was hospitalized with a meningitis type process.  Please see previous dictation.  She has osteoporosis, GE reflux, vitamin D deficiency, chronic venous stasis and stasis dermatitis.  History of hypertension.  She is alert and cooperative but not oriented to day of week or month.  She was hospitalized January 2020 with septic shock with right-sided congenital UPJ obstruction.  Subsequently in May 2020 she had cystoscopy with right retrograde pyelogram, right ureteral balloon dilatation of UPJ stricture and right ureteral stent placement by Dr. Gloriann Loan, urologist here in [place].  Subsequently stent was removed July 2020.  Social history: She is married.  She and her husband reside alone.  He has refused to consider moving to a retirement facility.  Patient has been on beta-blocker in the past to control PVCs and palpitations.  History of impaired glucose tolerance.  Saw Dr. Oneida Alar vascular surgeon in September who suggested she have greater saphenous vein laser ablation. This was done right leg November 2020. Leg leg has not had this procedure.  Husband does not think it is necessary at this point in time.  Review of Systems -recent left cataract extraction January.  Her cardiologist is aware of bradycardia.     Objective:   Physical Exam Blood pressure 110/60, pulse 44,  temperature 98 degrees orally pulse oximetry 99, weight 143 pounds.  BMI 23.08  Skin warm and dry.  Nodes none.  Neck is supple without JVD thyromegaly or carotid bruits.  Chest clear to auscultation.  Cardiac exam bradycardic regular rate and rhythm without murmurs.  Extremities not examined because legs are wrapped.  Neurological exam: She remains forgetful but is pleasant and cooperative       Assessment & Plan:  Memory loss-unchanged  Osteoporosis  Chronic leg ulcers treated at wound care center in [place] due to chronic venous stasis and stasis dermatitis  Essential hypertension and history of PVCs treated with beta-blocker and remains chronically bradycardic  History of right UPJ obstruction status post surgery after episode of septicemia July 2020  Impaired glucose tolerance  Plan: Continue current medications and follow-up in 6 months

## 2020-01-23 NOTE — Patient Instructions (Signed)
Continue current medications.  No change in medical management.  Follow-up here in 6 months.  Continue to follow-up with leg wound treatment at wound care center in [place].

## 2020-05-12 ENCOUNTER — Telehealth: Payer: Self-pay | Admitting: Internal Medicine

## 2020-05-12 NOTE — Telephone Encounter (Signed)
Notified via fax that patient was being admitted to [facility] for at least 3 days.

## 2020-05-16 NOTE — Telephone Encounter (Signed)
Patient was D/C from Hospital on 05/14/20, has 7 days left on Cipro. Need to call on Monday for Medication reconciliation and schedule hospital follow up for E coli UTI.

## 2020-05-16 NOTE — Telephone Encounter (Signed)
Transition Care Management Follow-up Telephone Call  Date of discharge and from where: 05/14/2020, [facility]   How have you been since you were released from the hospital? Not too good, per daughter  Any questions or concerns? No   Items Reviewed:  Did the pt receive and understand the discharge instructions provided? Yes   Medications obtained and verified? Yes   Any new allergies since your discharge? No   Dietary orders reviewed? Yes  Do you have support at home? Yes   Functional Questionnaire: (I = Independent and D = Dependent) ADLs: d  Bathing/Dressing- d  Meal Prep- d  Eating- d  Maintaining continence- d  Transferring/Ambulation- d  Managing Meds- d  Follow up appointments reviewed:   PCP Hospital f/u appt confirmed? No, daughter Levada Dy said she will call back, she is trying to place her in a facility for rehab.  [facility] f/u appt confirmed? Yes .  Are transportation arrangements needed? No   If their condition worsens, is the pt aware to call PCP or go to the Emergency Dept.? Yes  Was the patient provided with contact information for the PCP's office or ED? Yes  Was to pt encouraged to call back with questions or concerns? Yes

## 2020-05-17 ENCOUNTER — Telehealth: Payer: Self-pay | Admitting: Internal Medicine

## 2020-05-17 NOTE — Telephone Encounter (Signed)
Donna Woods called back on 05/17/2020 to say that she is having her mom admitted to [place] today for a couple of weeks for rehab. She will call us back when her parents are discharged.

## 2020-05-17 NOTE — Telephone Encounter (Signed)
Donna Woods [phone]  Levada Dy called to say that she is going to request FMLA paperwork from her job, she has had to call out this week to help take care of her mom and feels like she will need to do this in the future. I let her know she will need to schedule appointment in near future to discuss so we can fill out paperwork properly.

## 2020-06-03 ENCOUNTER — Telehealth: Payer: Self-pay | Admitting: Internal Medicine

## 2020-06-03 NOTE — Telephone Encounter (Addendum)
Transition Care Management Follow-up Telephone Call  Date of discharge and from where: 06/03/20  How have you been since you were released from the hospital?   Any questions or concerns? No   Items Reviewed:  Did the pt receive and understand the discharge instructions provided? Yes   Medications obtained and verified? Yes , new medications were added tramadol, prilosec, gabapentin, ativan.   Any new allergies since your discharge? No   Dietary orders reviewed? Yes  Do you have support at home? Yes   Functional Questionnaire: (I = Independent and D = Dependent) ADLs: D  Bathing/Dressing- D  Meal Prep- D  Eating- D  Maintaining continence- D  Transferring/Ambulation- D  Managing Meds- D  Follow up appointments reviewed:   PCP Hospital f/u appt confirmed? Yes  Scheduled to see  on  06/10/20@ 12:00PM.  [facility] f/u appt confirmed? No   Are transportation arrangements needed? No   If their condition worsens, is the pt aware to call PCP or go to the Emergency Dept.? Yes  Was the patient provided with contact information for the PCP's office or ED? Yes  Was to pt encouraged to call back with questions or concerns? Yes

## 2020-06-03 NOTE — Telephone Encounter (Signed)
Report received, I initialed and it is up front

## 2020-06-03 NOTE — Telephone Encounter (Signed)
Nurse from [facility] called to say that Donna Woods had been discharged home on 05/30/2020 and she wanted to scheduled a follow up appointment. I scheduled appointment for 06/10/20. Araceli can you call faculty back to update medication list, they are faxing report?

## 2020-06-07 ENCOUNTER — Telehealth: Payer: Self-pay | Admitting: Internal Medicine

## 2020-06-07 DIAGNOSIS — L97821 Non-pressure chronic ulcer of other part of left lower leg limited to breakdown of skin: Secondary | ICD-10-CM | POA: Diagnosis not present

## 2020-06-07 DIAGNOSIS — I872 Venous insufficiency (chronic) (peripheral): Secondary | ICD-10-CM | POA: Diagnosis not present

## 2020-06-07 DIAGNOSIS — L97811 Non-pressure chronic ulcer of other part of right lower leg limited to breakdown of skin: Secondary | ICD-10-CM | POA: Diagnosis not present

## 2020-06-07 NOTE — Telephone Encounter (Signed)
Faxed signed certified [place] Orders to [facility] phone [phone], fax [phone] for  06/01/2020 to 07/30/2020  SN 2W9, and 3 PRN for Wound complications PT 1WK7 OT [phone]

## 2020-06-09 ENCOUNTER — Other Ambulatory Visit: Payer: Self-pay | Admitting: Cardiovascular Disease

## 2020-06-10 ENCOUNTER — Ambulatory Visit (INDEPENDENT_AMBULATORY_CARE_PROVIDER_SITE_OTHER): Payer: Medicare Other | Admitting: Internal Medicine

## 2020-06-10 ENCOUNTER — Other Ambulatory Visit: Payer: Self-pay

## 2020-06-10 ENCOUNTER — Encounter: Payer: Self-pay | Admitting: Internal Medicine

## 2020-06-10 VITALS — BP 140/60 | HR 53 | Temp 98.7°F | Ht 66.0 in | Wt 140.0 lb

## 2020-06-10 DIAGNOSIS — L97919 Non-pressure chronic ulcer of unspecified part of right lower leg with unspecified severity: Secondary | ICD-10-CM | POA: Diagnosis not present

## 2020-06-10 DIAGNOSIS — R413 Other amnesia: Secondary | ICD-10-CM | POA: Diagnosis not present

## 2020-06-10 DIAGNOSIS — N135 Crossing vessel and stricture of ureter without hydronephrosis: Secondary | ICD-10-CM

## 2020-06-10 DIAGNOSIS — I10 Essential (primary) hypertension: Secondary | ICD-10-CM

## 2020-06-10 DIAGNOSIS — S81809A Unspecified open wound, unspecified lower leg, initial encounter: Secondary | ICD-10-CM

## 2020-06-10 DIAGNOSIS — L97929 Non-pressure chronic ulcer of unspecified part of left lower leg with unspecified severity: Secondary | ICD-10-CM

## 2020-06-10 MED ORDER — DOXYCYCLINE HYCLATE 100 MG PO TABS
100.0000 mg | ORAL_TABLET | Freq: Two times a day (BID) | ORAL | 1 refills | Status: DC
Start: 2020-06-10 — End: 2023-12-12

## 2020-06-11 LAB — CBC WITH DIFFERENTIAL/PLATELET
Absolute Monocytes: 811 cells/uL (ref 200–950)
Basophils Absolute: 85 cells/uL (ref 0–200)
Basophils Relative: 0.7 %
Eosinophils Absolute: 254 cells/uL (ref 15–500)
Eosinophils Relative: 2.1 %
HCT: 32.8 % — ABNORMAL LOW (ref 35.0–45.0)
Hemoglobin: 11 g/dL — ABNORMAL LOW (ref 11.7–15.5)
Lymphs Abs: 2093 cells/uL (ref 850–3900)
MCH: 31.9 pg (ref 27.0–33.0)
MCHC: 33.5 g/dL (ref 32.0–36.0)
MCV: 95.1 fL (ref 80.0–100.0)
MPV: 11.1 fL (ref 7.5–12.5)
Monocytes Relative: 6.7 %
Neutro Abs: 8857 cells/uL — ABNORMAL HIGH (ref 1500–7800)
Neutrophils Relative %: 73.2 %
Platelets: 261 10*3/uL (ref 140–400)
RBC: 3.45 10*6/uL — ABNORMAL LOW (ref 3.80–5.10)
RDW: 12 % (ref 11.0–15.0)
Total Lymphocyte: 17.3 %
WBC: 12.1 10*3/uL — ABNORMAL HIGH (ref 3.8–10.8)

## 2020-06-11 LAB — COMPLETE METABOLIC PANEL WITH GFR
AG Ratio: 1.3 (calc) (ref 1.0–2.5)
ALT: 6 U/L (ref 6–29)
AST: 7 U/L — ABNORMAL LOW (ref 10–35)
Albumin: 3.9 g/dL (ref 3.6–5.1)
Alkaline phosphatase (APISO): 60 U/L (ref 37–153)
BUN/Creatinine Ratio: 32 (calc) — ABNORMAL HIGH (ref 6–22)
BUN: 35 mg/dL — ABNORMAL HIGH (ref 7–25)
CO2: 20 mmol/L (ref 20–32)
Calcium: 9.5 mg/dL (ref 8.6–10.4)
Chloride: 109 mmol/L (ref 98–110)
Creat: 1.11 mg/dL — ABNORMAL HIGH (ref 0.60–0.88)
GFR, Est African American: 53 mL/min/{1.73_m2} — ABNORMAL LOW (ref >=60)
GFR, Est Non African American: 46 mL/min/{1.73_m2} — ABNORMAL LOW (ref >=60)
Globulin: 3 g/dL (calc) (ref 1.9–3.7)
Glucose, Bld: 88 mg/dL (ref 65–99)
Potassium: 4.2 mmol/L (ref 3.5–5.3)
Sodium: 142 mmol/L (ref 135–146)
Total Bilirubin: 0.4 mg/dL (ref 0.2–1.2)
Total Protein: 6.9 g/dL (ref 6.1–8.1)

## 2020-06-16 ENCOUNTER — Telehealth: Payer: Self-pay | Admitting: Internal Medicine

## 2020-06-16 ENCOUNTER — Encounter: Payer: Self-pay | Admitting: Internal Medicine

## 2020-06-16 ENCOUNTER — Telehealth: Payer: Self-pay

## 2020-06-16 NOTE — Telephone Encounter (Signed)
Faxed signed [place] orders to [facility] [phone], phone # [phone]  SN Effective 06/19/2020 1W5, 2W1

## 2020-06-16 NOTE — Progress Notes (Signed)
Subjective:    Patient ID: Donna Woods, female    DOB: 19-Dec-1933, 86 y.o.   MRN: [id number]  HPI 86 year old Female with history of memory disorder,, chronic kidney disease, venous stasis ulcerations of both legs treated until recently at wound care center in [facility] by Dr. Welton Flakes  Patient was hospitalized June 16 through June 19 at [facility] with confusion and mental status changes.  Was thought to have UTI.  Had white blood cell count of 13,100 with left shift with urinalysis showing 4+ bacteria and greater than 50 white blood cells per high-powered field.  Urine culture grew E. coli.  Creatinine elevated at 1.62.  Was admitted for treatment of mental status changes and UTI.  Patient was treated with IV Cipro.  There was concern for infection of her lower extremity wounds.  PT and OT were ordered.  It was recommended she go to skilled nursing care.  Was discharged to [facility] skilled nursing facility.  She was discharged home from [facility] on June 25.  Patient is now receiving home health services and wound dressings.  She is accompanied by her daughter today who is a Marine scientist.  Daughter has brought up supplies to wrap her mother's legs.  Daughter has not seen mother's legs recently.  Her legs were unwrapped carefully by patient's daughter.  There is erythema of both lower extremities but overall, legs look better than pictures daughter shows me from several weeks ago.  Patient was admitted to [facility] and transferred to ICU at [facility] in [place] in January 2020 with severe sepsis.  CT showed right UPJ obstruction.  A nephrostomy tube was placed December 20, 2018.  Blood and urine cultures were negative.  Subsequently underwent cystoscopy, right retrograde pyelogram, ureteral dilatation of UPJ stricture and right ureteral stent placement  per Dr. Gloriann Loan, urologist here in [place] in May 2020.  By August 2020 Dr. Gloriann Loan noted  right hydronephrosis had resolved with no residual stricture.  He recommended repeating ultrasound in 6 months and advised against treatment of asymptomatic bacteriuria.  Dr. Gloriann Loan saw her again in March 2021 and ultrasound showed resolution of hydronephrosis.  Patient was evaluated by cardiologist, Dr. Gwenlyn Found in 2019.  History of palpitations related to asymptomatic PVCs and history of hypertension.  Had nonischemic stress test around 2014.  Had Myoview stress test 2016 which was low risk.  Underwent total knee replacement without complication shortly thereafter.  Dr. Gwenlyn Found did not think patient had arterial component to her leg wounds was subsequently referred to wound care center and also s saw Dr. Dellia Nims for wound care management of legs for most of 2019 and early 2020.  Review of Systems see above patient is alert and cooperative but confused regarding day of week, month     Objective:   Physical Exam Blood pressure 140/60 pulse 53 regular pulse oximetry 95% temperature 98.7 degrees weight 140 pounds BMI 22.60  Skin warm and dry.  Lower extremity wounds seem to be slowly improving.  There is some generalized erythema of the lower legs without edema.  Subsequently legs were rewrapped carefully.  She will continue to receive home health care services for leg wound management.       Assessment & Plan:  Bilateral lower extremity leg wounds-stasis dermatitis with infection.  Have started doxycycline 100 mg twice daily for 7 days.  Hospitalized recently for E. coli UTI treated with Cipro.  No complaint of dysuria today and is  afebrile.  Memory loss-longstanding  Plan: Legs were wrapped carefully as directed by home health care wound care nurse.  This was performed patient's daughter, Donna Aurora, RN.  Continue with home health services for leg wounds.  Patient's daughter has tried to convince her father to consider retirement facility such as [facility] for patient and himself but he has been  reluctant to do so.  They reside in [place].  For possible cellulitis of the lower extremities placed on doxycycline 100 mg twice daily for 7 days.  Have scheduled health maintenance exam in early August.

## 2020-06-16 NOTE — Telephone Encounter (Signed)
If patient is alert and awake with no complaints just monitor her. Is BP OK?

## 2020-06-16 NOTE — Patient Instructions (Signed)
Legs were wrapped by patient's daughter who is a nurse today in the office.  The entire visit took 2 hours.  Patient will continue with leg wound treatment for home health services.  She was placed on doxycycline 100 mg twice daily for 7 days.  Return in early August for health maintenance exam.

## 2020-06-16 NOTE — Telephone Encounter (Signed)
Deven PT from [facility] called states this morning patient's heart rate is 51, she said per protocol they are to call patient's PCP if heart rate is below 60.   Call back number [phone]

## 2020-06-17 ENCOUNTER — Telehealth: Payer: Self-pay | Admitting: Internal Medicine

## 2020-06-17 NOTE — Telephone Encounter (Signed)
Received fax on 06/16/2020 that patient was being admitted to [facility] in [place]

## 2020-06-17 NOTE — Telephone Encounter (Signed)
Was only ED visit / was discharged

## 2020-06-17 NOTE — Telephone Encounter (Signed)
[place] called to say when he went to patients home today he was met at the door by husband to say that patient was not up to PT today because of an ER visit last night. She is on some strong medication that prevents her from being able to participate.

## 2020-06-20 ENCOUNTER — Telehealth: Payer: Self-pay | Admitting: Internal Medicine

## 2020-06-20 NOTE — Telephone Encounter (Signed)
[place] Orders to [facility] [phone]  Add antibiotic and pain medication

## 2020-06-22 ENCOUNTER — Telehealth: Payer: Self-pay | Admitting: Internal Medicine

## 2020-06-22 NOTE — Telephone Encounter (Signed)
[place] Orders to [facility] [phone] -- phone number [phone]  Increase SNV to 2W6 for Wound care as ordered effective 7/25/201 2WK5, 1VI7

## 2020-06-28 ENCOUNTER — Other Ambulatory Visit: Payer: Medicare Other | Admitting: Internal Medicine

## 2020-06-30 ENCOUNTER — Encounter: Payer: Medicare Other | Admitting: Internal Medicine

## 2020-07-08 ENCOUNTER — Telehealth: Payer: Self-pay | Admitting: Internal Medicine

## 2020-07-08 DIAGNOSIS — Z029 Encounter for administrative examinations, unspecified: Secondary | ICD-10-CM

## 2020-07-08 NOTE — Telephone Encounter (Signed)
Donna Woods [phone]  Levada Dy called to say she had spoke with her dad last night and her mom is going to need her medications, but she does not know which ones. Pharmacy is [facility] in [place].

## 2020-07-08 NOTE — Telephone Encounter (Signed)
Dr Renold Genta left a message for Donna Woods to get with her Dad and to call us back and let us know what needs to be refilled.

## 2020-07-08 NOTE — Telephone Encounter (Signed)
I attempted to call [facility] in [place]( open 24 hours) to speak with pharmacist and to review patient's current list of meds that we have. No one was available to speak with me although I identified myself as a Programmer, systems. Patient has also recently been seen at wound care center in [facility] and apparently Trental was prescribed there. Dr. Gwenlyn Found, cardiologist, had previously prescribed Tenormin and we have continued that medication. She takes mulitivitamins, Melatonin, and Prilosec. There are eye drops which she uses.  It looks like she is no longer taking Zyprexa prescribed last year.Doxycycline was prescribed for leg ulcers for 2 weeks in mid July when we saw her.We prescribed Tramadol in July.She takes Zyrtec and Tylenol.   There is ongoing wound care by Dr. Magnus Sinning at [facility] in [facility] (From 8/11) but tonight we received home health orders from [facility].   In my opinion she needs a Primary Care provider closer to her home. Patient and husband have not been willing to move to Assisted Living facility. I have left daughter a message saying that I am not certain what to refill and need her to clarify what is needed. I also suggested they find a provider closer to Mrs. Coghlan' home.

## 2020-07-11 ENCOUNTER — Encounter: Payer: Self-pay | Admitting: Internal Medicine

## 2020-07-11 ENCOUNTER — Telehealth: Payer: Self-pay | Admitting: Internal Medicine

## 2020-07-11 NOTE — Telephone Encounter (Signed)
Faxed signed Home Health Orders fax [phone], phone [phone] effective 07/10/2020 signed on 07/08/2020  Reason - Chronic Ulcer SN effective 9/79/5369 1WK2, 2WK1 Certification good 12/29/3007 to 07/30/2020

## 2020-07-12 ENCOUNTER — Other Ambulatory Visit: Payer: Self-pay

## 2020-07-12 ENCOUNTER — Telehealth: Payer: Self-pay | Admitting: Internal Medicine

## 2020-07-12 ENCOUNTER — Other Ambulatory Visit: Payer: Self-pay | Admitting: Internal Medicine

## 2020-07-12 DIAGNOSIS — I872 Venous insufficiency (chronic) (peripheral): Secondary | ICD-10-CM

## 2020-07-12 MED ORDER — OXYCODONE HCL 5 MG PO TABS: 5.0000 mg | ORAL_TABLET | Freq: Three times a day (TID) | ORAL | 0 refills | Status: DC | PRN

## 2020-07-12 MED ORDER — OXYCODONE-ACETAMINOPHEN 5-325 MG PO TABS: 1.0000 | ORAL_TABLET | Freq: Three times a day (TID) | ORAL | 0 refills | Status: DC | PRN

## 2020-07-12 NOTE — Telephone Encounter (Signed)
Refill once 

## 2020-07-12 NOTE — Telephone Encounter (Signed)
Dena Esperanza [phone]  Marlowe Kays called to say his wife needed the below medication, she had enough for one more treatment.   He also said by the end of the week she would be needing the rest of her medication and I let him know he needs to contact the pharmacy and have them contact us, so we can fill them. That we can not just fill medications without knowing which ones are needed.  oxyCODONE-acetaminophen (PERCOCET/ROXICET) 5-325 MG tablet [[id number]]  [id number] - [place], [address] AT [facility] [address] Phone:  [phone]  Fax:  [phone]

## 2020-07-12 NOTE — Telephone Encounter (Signed)
Patient has a lot of pain with dressing changes on legs. She has dementia. Home health staff have a hard time with dressing changes if pain med not given in advance of dressing changes. Husband asking for refill. Will send in Oxycodone APAP #15  5/325 to use sparingly for pain. Letter to be sent to Patient and husband asking them to choose another Primary care Physician closer to their home. It is inconvenient for them to travel to [facility] and patient has had to be seen at [facility] intermittently.

## 2020-07-25 ENCOUNTER — Telehealth: Payer: Self-pay | Admitting: Internal Medicine

## 2020-07-25 NOTE — Telephone Encounter (Signed)
[place] Orders to [facility] [phone], office [phone]  Signed order faxed

## 2020-07-28 ENCOUNTER — Telehealth: Payer: Self-pay | Admitting: Internal Medicine

## 2020-07-28 DIAGNOSIS — L97821 Non-pressure chronic ulcer of other part of left lower leg limited to breakdown of skin: Secondary | ICD-10-CM | POA: Diagnosis not present

## 2020-07-28 DIAGNOSIS — L97811 Non-pressure chronic ulcer of other part of right lower leg limited to breakdown of skin: Secondary | ICD-10-CM | POA: Diagnosis not present

## 2020-07-28 DIAGNOSIS — I872 Venous insufficiency (chronic) (peripheral): Secondary | ICD-10-CM | POA: Diagnosis not present

## 2020-07-28 NOTE — Telephone Encounter (Signed)
Faxed [phone]) [place] Orders to [facility]  Re certify for 4/1/962 to 09/28/2020 -- How ever Dr Renold Genta care will end on 08/13/2020 - patient has been notified via certified mail to seek new PCP closer to home for continued care. Ordered # [id number]

## 2020-08-10 ENCOUNTER — Telehealth: Payer: Self-pay | Admitting: Internal Medicine

## 2020-08-10 NOTE — Telephone Encounter (Signed)
Received via fax this evening home health orders from [facility] requesting orders approval through November 3 ,2021 for home health services. A letter has been sent telling this agency that we will cover through September 30th only and that patient and husband were notified by certified mail August 18 that we were giving 30 days of emergency care and advised they should find a PCP closer to [facility].

## 2020-08-12 ENCOUNTER — Other Ambulatory Visit: Payer: Medicare Other | Admitting: Internal Medicine

## 2020-08-15 ENCOUNTER — Encounter: Payer: Medicare Other | Admitting: Internal Medicine

## 2020-08-17 ENCOUNTER — Telehealth: Payer: Self-pay | Admitting: Internal Medicine

## 2020-08-17 NOTE — Telephone Encounter (Signed)
[place] Orders to [facility] [phone] order date 08/03/2020, order # [id number], Cert date 03/28/3173 till 08/25/2020

## 2020-08-25 ENCOUNTER — Telehealth: Payer: Self-pay | Admitting: Internal Medicine

## 2020-08-25 NOTE — Telephone Encounter (Signed)
Faxed signed orders to [facility] [phone]  Order # [id number]  Order date 08/24/20

## 2020-10-29 DIAGNOSIS — M8580 Other specified disorders of bone density and structure, unspecified site: Secondary | ICD-10-CM | POA: Diagnosis not present

## 2020-10-29 DIAGNOSIS — I872 Venous insufficiency (chronic) (peripheral): Secondary | ICD-10-CM | POA: Diagnosis not present

## 2020-10-29 DIAGNOSIS — Z87440 Personal history of urinary (tract) infections: Secondary | ICD-10-CM | POA: Diagnosis not present

## 2020-10-29 DIAGNOSIS — F419 Anxiety disorder, unspecified: Secondary | ICD-10-CM | POA: Diagnosis not present

## 2020-10-29 DIAGNOSIS — J309 Allergic rhinitis, unspecified: Secondary | ICD-10-CM | POA: Diagnosis not present

## 2020-10-29 DIAGNOSIS — Z96653 Presence of artificial knee joint, bilateral: Secondary | ICD-10-CM | POA: Diagnosis not present

## 2020-10-29 DIAGNOSIS — K219 Gastro-esophageal reflux disease without esophagitis: Secondary | ICD-10-CM | POA: Diagnosis not present

## 2020-10-29 DIAGNOSIS — L97811 Non-pressure chronic ulcer of other part of right lower leg limited to breakdown of skin: Secondary | ICD-10-CM | POA: Diagnosis not present

## 2020-10-29 DIAGNOSIS — I129 Hypertensive chronic kidney disease with stage 1 through stage 4 chronic kidney disease, or unspecified chronic kidney disease: Secondary | ICD-10-CM | POA: Diagnosis not present

## 2020-10-29 DIAGNOSIS — G4700 Insomnia, unspecified: Secondary | ICD-10-CM | POA: Diagnosis not present

## 2020-10-29 DIAGNOSIS — R001 Bradycardia, unspecified: Secondary | ICD-10-CM | POA: Diagnosis not present

## 2020-10-29 DIAGNOSIS — L97821 Non-pressure chronic ulcer of other part of left lower leg limited to breakdown of skin: Secondary | ICD-10-CM | POA: Diagnosis not present

## 2020-10-29 DIAGNOSIS — G629 Polyneuropathy, unspecified: Secondary | ICD-10-CM | POA: Diagnosis not present

## 2020-10-29 DIAGNOSIS — Z961 Presence of intraocular lens: Secondary | ICD-10-CM | POA: Diagnosis not present

## 2020-10-29 DIAGNOSIS — F0390 Unspecified dementia without behavioral disturbance: Secondary | ICD-10-CM | POA: Diagnosis not present

## 2020-10-29 DIAGNOSIS — N189 Chronic kidney disease, unspecified: Secondary | ICD-10-CM | POA: Diagnosis not present

## 2020-10-29 DIAGNOSIS — M40204 Unspecified kyphosis, thoracic region: Secondary | ICD-10-CM | POA: Diagnosis not present

## 2020-11-01 DIAGNOSIS — N189 Chronic kidney disease, unspecified: Secondary | ICD-10-CM | POA: Diagnosis not present

## 2020-11-01 DIAGNOSIS — I872 Venous insufficiency (chronic) (peripheral): Secondary | ICD-10-CM | POA: Diagnosis not present

## 2020-11-01 DIAGNOSIS — L97821 Non-pressure chronic ulcer of other part of left lower leg limited to breakdown of skin: Secondary | ICD-10-CM | POA: Diagnosis not present

## 2020-11-01 DIAGNOSIS — I129 Hypertensive chronic kidney disease with stage 1 through stage 4 chronic kidney disease, or unspecified chronic kidney disease: Secondary | ICD-10-CM | POA: Diagnosis not present

## 2020-11-01 DIAGNOSIS — F0390 Unspecified dementia without behavioral disturbance: Secondary | ICD-10-CM | POA: Diagnosis not present

## 2020-11-01 DIAGNOSIS — L97811 Non-pressure chronic ulcer of other part of right lower leg limited to breakdown of skin: Secondary | ICD-10-CM | POA: Diagnosis not present

## 2020-11-04 DIAGNOSIS — N189 Chronic kidney disease, unspecified: Secondary | ICD-10-CM | POA: Diagnosis not present

## 2020-11-04 DIAGNOSIS — L97811 Non-pressure chronic ulcer of other part of right lower leg limited to breakdown of skin: Secondary | ICD-10-CM | POA: Diagnosis not present

## 2020-11-04 DIAGNOSIS — I129 Hypertensive chronic kidney disease with stage 1 through stage 4 chronic kidney disease, or unspecified chronic kidney disease: Secondary | ICD-10-CM | POA: Diagnosis not present

## 2020-11-04 DIAGNOSIS — F0390 Unspecified dementia without behavioral disturbance: Secondary | ICD-10-CM | POA: Diagnosis not present

## 2020-11-04 DIAGNOSIS — I872 Venous insufficiency (chronic) (peripheral): Secondary | ICD-10-CM | POA: Diagnosis not present

## 2020-11-04 DIAGNOSIS — L97821 Non-pressure chronic ulcer of other part of left lower leg limited to breakdown of skin: Secondary | ICD-10-CM | POA: Diagnosis not present

## 2020-11-07 DIAGNOSIS — I872 Venous insufficiency (chronic) (peripheral): Secondary | ICD-10-CM | POA: Diagnosis not present

## 2020-11-07 DIAGNOSIS — L97821 Non-pressure chronic ulcer of other part of left lower leg limited to breakdown of skin: Secondary | ICD-10-CM | POA: Diagnosis not present

## 2020-11-07 DIAGNOSIS — L97811 Non-pressure chronic ulcer of other part of right lower leg limited to breakdown of skin: Secondary | ICD-10-CM | POA: Diagnosis not present

## 2020-11-07 DIAGNOSIS — N189 Chronic kidney disease, unspecified: Secondary | ICD-10-CM | POA: Diagnosis not present

## 2020-11-07 DIAGNOSIS — I129 Hypertensive chronic kidney disease with stage 1 through stage 4 chronic kidney disease, or unspecified chronic kidney disease: Secondary | ICD-10-CM | POA: Diagnosis not present

## 2020-11-07 DIAGNOSIS — F0390 Unspecified dementia without behavioral disturbance: Secondary | ICD-10-CM | POA: Diagnosis not present

## 2020-11-11 DIAGNOSIS — F0390 Unspecified dementia without behavioral disturbance: Secondary | ICD-10-CM | POA: Diagnosis not present

## 2020-11-11 DIAGNOSIS — N189 Chronic kidney disease, unspecified: Secondary | ICD-10-CM | POA: Diagnosis not present

## 2020-11-11 DIAGNOSIS — L97811 Non-pressure chronic ulcer of other part of right lower leg limited to breakdown of skin: Secondary | ICD-10-CM | POA: Diagnosis not present

## 2020-11-11 DIAGNOSIS — I872 Venous insufficiency (chronic) (peripheral): Secondary | ICD-10-CM | POA: Diagnosis not present

## 2020-11-11 DIAGNOSIS — L97821 Non-pressure chronic ulcer of other part of left lower leg limited to breakdown of skin: Secondary | ICD-10-CM | POA: Diagnosis not present

## 2020-11-11 DIAGNOSIS — I129 Hypertensive chronic kidney disease with stage 1 through stage 4 chronic kidney disease, or unspecified chronic kidney disease: Secondary | ICD-10-CM | POA: Diagnosis not present

## 2020-11-14 DIAGNOSIS — F0390 Unspecified dementia without behavioral disturbance: Secondary | ICD-10-CM | POA: Diagnosis not present

## 2020-11-14 DIAGNOSIS — L97811 Non-pressure chronic ulcer of other part of right lower leg limited to breakdown of skin: Secondary | ICD-10-CM | POA: Diagnosis not present

## 2020-11-14 DIAGNOSIS — I872 Venous insufficiency (chronic) (peripheral): Secondary | ICD-10-CM | POA: Diagnosis not present

## 2020-11-14 DIAGNOSIS — N189 Chronic kidney disease, unspecified: Secondary | ICD-10-CM | POA: Diagnosis not present

## 2020-11-14 DIAGNOSIS — I129 Hypertensive chronic kidney disease with stage 1 through stage 4 chronic kidney disease, or unspecified chronic kidney disease: Secondary | ICD-10-CM | POA: Diagnosis not present

## 2020-11-14 DIAGNOSIS — L97821 Non-pressure chronic ulcer of other part of left lower leg limited to breakdown of skin: Secondary | ICD-10-CM | POA: Diagnosis not present

## 2020-11-17 DIAGNOSIS — L97821 Non-pressure chronic ulcer of other part of left lower leg limited to breakdown of skin: Secondary | ICD-10-CM | POA: Diagnosis not present

## 2020-11-17 DIAGNOSIS — N189 Chronic kidney disease, unspecified: Secondary | ICD-10-CM | POA: Diagnosis not present

## 2020-11-17 DIAGNOSIS — F0390 Unspecified dementia without behavioral disturbance: Secondary | ICD-10-CM | POA: Diagnosis not present

## 2020-11-17 DIAGNOSIS — L97811 Non-pressure chronic ulcer of other part of right lower leg limited to breakdown of skin: Secondary | ICD-10-CM | POA: Diagnosis not present

## 2020-11-17 DIAGNOSIS — I872 Venous insufficiency (chronic) (peripheral): Secondary | ICD-10-CM | POA: Diagnosis not present

## 2020-11-17 DIAGNOSIS — I129 Hypertensive chronic kidney disease with stage 1 through stage 4 chronic kidney disease, or unspecified chronic kidney disease: Secondary | ICD-10-CM | POA: Diagnosis not present

## 2020-11-21 DIAGNOSIS — I872 Venous insufficiency (chronic) (peripheral): Secondary | ICD-10-CM | POA: Diagnosis not present

## 2020-11-21 DIAGNOSIS — I129 Hypertensive chronic kidney disease with stage 1 through stage 4 chronic kidney disease, or unspecified chronic kidney disease: Secondary | ICD-10-CM | POA: Diagnosis not present

## 2020-11-21 DIAGNOSIS — L97811 Non-pressure chronic ulcer of other part of right lower leg limited to breakdown of skin: Secondary | ICD-10-CM | POA: Diagnosis not present

## 2020-11-21 DIAGNOSIS — L97821 Non-pressure chronic ulcer of other part of left lower leg limited to breakdown of skin: Secondary | ICD-10-CM | POA: Diagnosis not present

## 2020-11-21 DIAGNOSIS — N189 Chronic kidney disease, unspecified: Secondary | ICD-10-CM | POA: Diagnosis not present

## 2020-11-21 DIAGNOSIS — F0390 Unspecified dementia without behavioral disturbance: Secondary | ICD-10-CM | POA: Diagnosis not present

## 2020-11-23 DIAGNOSIS — N189 Chronic kidney disease, unspecified: Secondary | ICD-10-CM | POA: Diagnosis not present

## 2020-11-23 DIAGNOSIS — F0390 Unspecified dementia without behavioral disturbance: Secondary | ICD-10-CM | POA: Diagnosis not present

## 2020-11-23 DIAGNOSIS — L97821 Non-pressure chronic ulcer of other part of left lower leg limited to breakdown of skin: Secondary | ICD-10-CM | POA: Diagnosis not present

## 2020-11-23 DIAGNOSIS — I872 Venous insufficiency (chronic) (peripheral): Secondary | ICD-10-CM | POA: Diagnosis not present

## 2020-11-23 DIAGNOSIS — L97811 Non-pressure chronic ulcer of other part of right lower leg limited to breakdown of skin: Secondary | ICD-10-CM | POA: Diagnosis not present

## 2020-11-23 DIAGNOSIS — I129 Hypertensive chronic kidney disease with stage 1 through stage 4 chronic kidney disease, or unspecified chronic kidney disease: Secondary | ICD-10-CM | POA: Diagnosis not present

## 2020-11-25 DIAGNOSIS — I129 Hypertensive chronic kidney disease with stage 1 through stage 4 chronic kidney disease, or unspecified chronic kidney disease: Secondary | ICD-10-CM | POA: Diagnosis not present

## 2020-11-25 DIAGNOSIS — F0390 Unspecified dementia without behavioral disturbance: Secondary | ICD-10-CM | POA: Diagnosis not present

## 2020-11-25 DIAGNOSIS — L97821 Non-pressure chronic ulcer of other part of left lower leg limited to breakdown of skin: Secondary | ICD-10-CM | POA: Diagnosis not present

## 2020-11-25 DIAGNOSIS — I872 Venous insufficiency (chronic) (peripheral): Secondary | ICD-10-CM | POA: Diagnosis not present

## 2020-11-25 DIAGNOSIS — N189 Chronic kidney disease, unspecified: Secondary | ICD-10-CM | POA: Diagnosis not present

## 2020-11-25 DIAGNOSIS — L97811 Non-pressure chronic ulcer of other part of right lower leg limited to breakdown of skin: Secondary | ICD-10-CM | POA: Diagnosis not present

## 2020-11-28 DIAGNOSIS — I872 Venous insufficiency (chronic) (peripheral): Secondary | ICD-10-CM | POA: Diagnosis not present

## 2020-11-28 DIAGNOSIS — F0390 Unspecified dementia without behavioral disturbance: Secondary | ICD-10-CM | POA: Diagnosis not present

## 2020-11-28 DIAGNOSIS — L97821 Non-pressure chronic ulcer of other part of left lower leg limited to breakdown of skin: Secondary | ICD-10-CM | POA: Diagnosis not present

## 2020-11-28 DIAGNOSIS — M8580 Other specified disorders of bone density and structure, unspecified site: Secondary | ICD-10-CM | POA: Diagnosis not present

## 2020-11-28 DIAGNOSIS — G4700 Insomnia, unspecified: Secondary | ICD-10-CM | POA: Diagnosis not present

## 2020-11-28 DIAGNOSIS — Z96653 Presence of artificial knee joint, bilateral: Secondary | ICD-10-CM | POA: Diagnosis not present

## 2020-11-28 DIAGNOSIS — G629 Polyneuropathy, unspecified: Secondary | ICD-10-CM | POA: Diagnosis not present

## 2020-11-28 DIAGNOSIS — Z87440 Personal history of urinary (tract) infections: Secondary | ICD-10-CM | POA: Diagnosis not present

## 2020-11-28 DIAGNOSIS — M40204 Unspecified kyphosis, thoracic region: Secondary | ICD-10-CM | POA: Diagnosis not present

## 2020-11-28 DIAGNOSIS — I129 Hypertensive chronic kidney disease with stage 1 through stage 4 chronic kidney disease, or unspecified chronic kidney disease: Secondary | ICD-10-CM | POA: Diagnosis not present

## 2020-11-28 DIAGNOSIS — Z961 Presence of intraocular lens: Secondary | ICD-10-CM | POA: Diagnosis not present

## 2020-11-28 DIAGNOSIS — N189 Chronic kidney disease, unspecified: Secondary | ICD-10-CM | POA: Diagnosis not present

## 2020-11-28 DIAGNOSIS — J309 Allergic rhinitis, unspecified: Secondary | ICD-10-CM | POA: Diagnosis not present

## 2020-11-28 DIAGNOSIS — L97811 Non-pressure chronic ulcer of other part of right lower leg limited to breakdown of skin: Secondary | ICD-10-CM | POA: Diagnosis not present

## 2020-11-28 DIAGNOSIS — R001 Bradycardia, unspecified: Secondary | ICD-10-CM | POA: Diagnosis not present

## 2020-11-28 DIAGNOSIS — K219 Gastro-esophageal reflux disease without esophagitis: Secondary | ICD-10-CM | POA: Diagnosis not present

## 2020-11-28 DIAGNOSIS — F419 Anxiety disorder, unspecified: Secondary | ICD-10-CM | POA: Diagnosis not present

## 2020-11-30 DIAGNOSIS — I83023 Varicose veins of left lower extremity with ulcer of ankle: Secondary | ICD-10-CM | POA: Diagnosis not present

## 2020-11-30 DIAGNOSIS — L97821 Non-pressure chronic ulcer of other part of left lower leg limited to breakdown of skin: Secondary | ICD-10-CM | POA: Diagnosis not present

## 2020-11-30 DIAGNOSIS — I83018 Varicose veins of right lower extremity with ulcer other part of lower leg: Secondary | ICD-10-CM | POA: Diagnosis not present

## 2020-11-30 DIAGNOSIS — I83028 Varicose veins of left lower extremity with ulcer other part of lower leg: Secondary | ICD-10-CM | POA: Diagnosis not present

## 2020-11-30 DIAGNOSIS — L97812 Non-pressure chronic ulcer of other part of right lower leg with fat layer exposed: Secondary | ICD-10-CM | POA: Diagnosis not present

## 2020-11-30 DIAGNOSIS — L97322 Non-pressure chronic ulcer of left ankle with fat layer exposed: Secondary | ICD-10-CM | POA: Diagnosis not present

## 2020-11-30 DIAGNOSIS — L97822 Non-pressure chronic ulcer of other part of left lower leg with fat layer exposed: Secondary | ICD-10-CM | POA: Diagnosis not present

## 2020-11-30 DIAGNOSIS — L97811 Non-pressure chronic ulcer of other part of right lower leg limited to breakdown of skin: Secondary | ICD-10-CM | POA: Diagnosis not present

## 2020-12-02 DIAGNOSIS — F0390 Unspecified dementia without behavioral disturbance: Secondary | ICD-10-CM | POA: Diagnosis not present

## 2020-12-02 DIAGNOSIS — I872 Venous insufficiency (chronic) (peripheral): Secondary | ICD-10-CM | POA: Diagnosis not present

## 2020-12-02 DIAGNOSIS — L97811 Non-pressure chronic ulcer of other part of right lower leg limited to breakdown of skin: Secondary | ICD-10-CM | POA: Diagnosis not present

## 2020-12-02 DIAGNOSIS — L97821 Non-pressure chronic ulcer of other part of left lower leg limited to breakdown of skin: Secondary | ICD-10-CM | POA: Diagnosis not present

## 2020-12-02 DIAGNOSIS — I129 Hypertensive chronic kidney disease with stage 1 through stage 4 chronic kidney disease, or unspecified chronic kidney disease: Secondary | ICD-10-CM | POA: Diagnosis not present

## 2020-12-02 DIAGNOSIS — N189 Chronic kidney disease, unspecified: Secondary | ICD-10-CM | POA: Diagnosis not present

## 2020-12-05 DIAGNOSIS — L97821 Non-pressure chronic ulcer of other part of left lower leg limited to breakdown of skin: Secondary | ICD-10-CM | POA: Diagnosis not present

## 2020-12-05 DIAGNOSIS — I872 Venous insufficiency (chronic) (peripheral): Secondary | ICD-10-CM | POA: Diagnosis not present

## 2020-12-05 DIAGNOSIS — N189 Chronic kidney disease, unspecified: Secondary | ICD-10-CM | POA: Diagnosis not present

## 2020-12-05 DIAGNOSIS — F0390 Unspecified dementia without behavioral disturbance: Secondary | ICD-10-CM | POA: Diagnosis not present

## 2020-12-05 DIAGNOSIS — I129 Hypertensive chronic kidney disease with stage 1 through stage 4 chronic kidney disease, or unspecified chronic kidney disease: Secondary | ICD-10-CM | POA: Diagnosis not present

## 2020-12-05 DIAGNOSIS — L97811 Non-pressure chronic ulcer of other part of right lower leg limited to breakdown of skin: Secondary | ICD-10-CM | POA: Diagnosis not present

## 2020-12-08 DIAGNOSIS — I872 Venous insufficiency (chronic) (peripheral): Secondary | ICD-10-CM | POA: Diagnosis not present

## 2020-12-08 DIAGNOSIS — L97811 Non-pressure chronic ulcer of other part of right lower leg limited to breakdown of skin: Secondary | ICD-10-CM | POA: Diagnosis not present

## 2020-12-08 DIAGNOSIS — I129 Hypertensive chronic kidney disease with stage 1 through stage 4 chronic kidney disease, or unspecified chronic kidney disease: Secondary | ICD-10-CM | POA: Diagnosis not present

## 2020-12-08 DIAGNOSIS — L97821 Non-pressure chronic ulcer of other part of left lower leg limited to breakdown of skin: Secondary | ICD-10-CM | POA: Diagnosis not present

## 2020-12-08 DIAGNOSIS — N189 Chronic kidney disease, unspecified: Secondary | ICD-10-CM | POA: Diagnosis not present

## 2020-12-08 DIAGNOSIS — F0390 Unspecified dementia without behavioral disturbance: Secondary | ICD-10-CM | POA: Diagnosis not present

## 2020-12-12 DIAGNOSIS — I129 Hypertensive chronic kidney disease with stage 1 through stage 4 chronic kidney disease, or unspecified chronic kidney disease: Secondary | ICD-10-CM | POA: Diagnosis not present

## 2020-12-12 DIAGNOSIS — L97821 Non-pressure chronic ulcer of other part of left lower leg limited to breakdown of skin: Secondary | ICD-10-CM | POA: Diagnosis not present

## 2020-12-12 DIAGNOSIS — I872 Venous insufficiency (chronic) (peripheral): Secondary | ICD-10-CM | POA: Diagnosis not present

## 2020-12-12 DIAGNOSIS — F0390 Unspecified dementia without behavioral disturbance: Secondary | ICD-10-CM | POA: Diagnosis not present

## 2020-12-12 DIAGNOSIS — L97811 Non-pressure chronic ulcer of other part of right lower leg limited to breakdown of skin: Secondary | ICD-10-CM | POA: Diagnosis not present

## 2020-12-12 DIAGNOSIS — N189 Chronic kidney disease, unspecified: Secondary | ICD-10-CM | POA: Diagnosis not present

## 2020-12-15 DIAGNOSIS — N189 Chronic kidney disease, unspecified: Secondary | ICD-10-CM | POA: Diagnosis not present

## 2020-12-15 DIAGNOSIS — L97811 Non-pressure chronic ulcer of other part of right lower leg limited to breakdown of skin: Secondary | ICD-10-CM | POA: Diagnosis not present

## 2020-12-15 DIAGNOSIS — L97821 Non-pressure chronic ulcer of other part of left lower leg limited to breakdown of skin: Secondary | ICD-10-CM | POA: Diagnosis not present

## 2020-12-15 DIAGNOSIS — I872 Venous insufficiency (chronic) (peripheral): Secondary | ICD-10-CM | POA: Diagnosis not present

## 2020-12-15 DIAGNOSIS — F0390 Unspecified dementia without behavioral disturbance: Secondary | ICD-10-CM | POA: Diagnosis not present

## 2020-12-15 DIAGNOSIS — I129 Hypertensive chronic kidney disease with stage 1 through stage 4 chronic kidney disease, or unspecified chronic kidney disease: Secondary | ICD-10-CM | POA: Diagnosis not present

## 2020-12-19 DIAGNOSIS — I872 Venous insufficiency (chronic) (peripheral): Secondary | ICD-10-CM | POA: Diagnosis not present

## 2020-12-19 DIAGNOSIS — F0390 Unspecified dementia without behavioral disturbance: Secondary | ICD-10-CM | POA: Diagnosis not present

## 2020-12-19 DIAGNOSIS — L97821 Non-pressure chronic ulcer of other part of left lower leg limited to breakdown of skin: Secondary | ICD-10-CM | POA: Diagnosis not present

## 2020-12-19 DIAGNOSIS — L97811 Non-pressure chronic ulcer of other part of right lower leg limited to breakdown of skin: Secondary | ICD-10-CM | POA: Diagnosis not present

## 2020-12-19 DIAGNOSIS — I129 Hypertensive chronic kidney disease with stage 1 through stage 4 chronic kidney disease, or unspecified chronic kidney disease: Secondary | ICD-10-CM | POA: Diagnosis not present

## 2020-12-19 DIAGNOSIS — N189 Chronic kidney disease, unspecified: Secondary | ICD-10-CM | POA: Diagnosis not present

## 2020-12-21 DIAGNOSIS — L97222 Non-pressure chronic ulcer of left calf with fat layer exposed: Secondary | ICD-10-CM | POA: Diagnosis not present

## 2020-12-21 DIAGNOSIS — L97212 Non-pressure chronic ulcer of right calf with fat layer exposed: Secondary | ICD-10-CM | POA: Diagnosis not present

## 2020-12-21 DIAGNOSIS — I83019 Varicose veins of right lower extremity with ulcer of unspecified site: Secondary | ICD-10-CM | POA: Diagnosis not present

## 2020-12-21 DIAGNOSIS — I872 Venous insufficiency (chronic) (peripheral): Secondary | ICD-10-CM | POA: Diagnosis not present

## 2020-12-21 DIAGNOSIS — L97919 Non-pressure chronic ulcer of unspecified part of right lower leg with unspecified severity: Secondary | ICD-10-CM | POA: Diagnosis not present

## 2020-12-21 DIAGNOSIS — R609 Edema, unspecified: Secondary | ICD-10-CM | POA: Diagnosis not present

## 2020-12-21 DIAGNOSIS — I83891 Varicose veins of right lower extremities with other complications: Secondary | ICD-10-CM | POA: Diagnosis not present

## 2020-12-26 DIAGNOSIS — H01111 Allergic dermatitis of right upper eyelid: Secondary | ICD-10-CM | POA: Diagnosis not present

## 2020-12-26 DIAGNOSIS — I872 Venous insufficiency (chronic) (peripheral): Secondary | ICD-10-CM | POA: Diagnosis not present

## 2020-12-26 DIAGNOSIS — L97811 Non-pressure chronic ulcer of other part of right lower leg limited to breakdown of skin: Secondary | ICD-10-CM | POA: Diagnosis not present

## 2020-12-26 DIAGNOSIS — N189 Chronic kidney disease, unspecified: Secondary | ICD-10-CM | POA: Diagnosis not present

## 2020-12-26 DIAGNOSIS — I129 Hypertensive chronic kidney disease with stage 1 through stage 4 chronic kidney disease, or unspecified chronic kidney disease: Secondary | ICD-10-CM | POA: Diagnosis not present

## 2020-12-26 DIAGNOSIS — L97821 Non-pressure chronic ulcer of other part of left lower leg limited to breakdown of skin: Secondary | ICD-10-CM | POA: Diagnosis not present

## 2020-12-26 DIAGNOSIS — F0390 Unspecified dementia without behavioral disturbance: Secondary | ICD-10-CM | POA: Diagnosis not present

## 2020-12-28 DIAGNOSIS — L97811 Non-pressure chronic ulcer of other part of right lower leg limited to breakdown of skin: Secondary | ICD-10-CM | POA: Diagnosis not present

## 2020-12-28 DIAGNOSIS — G4700 Insomnia, unspecified: Secondary | ICD-10-CM | POA: Diagnosis not present

## 2020-12-28 DIAGNOSIS — R001 Bradycardia, unspecified: Secondary | ICD-10-CM | POA: Diagnosis not present

## 2020-12-28 DIAGNOSIS — J309 Allergic rhinitis, unspecified: Secondary | ICD-10-CM | POA: Diagnosis not present

## 2020-12-28 DIAGNOSIS — I872 Venous insufficiency (chronic) (peripheral): Secondary | ICD-10-CM | POA: Diagnosis not present

## 2020-12-28 DIAGNOSIS — K219 Gastro-esophageal reflux disease without esophagitis: Secondary | ICD-10-CM | POA: Diagnosis not present

## 2020-12-28 DIAGNOSIS — L97821 Non-pressure chronic ulcer of other part of left lower leg limited to breakdown of skin: Secondary | ICD-10-CM | POA: Diagnosis not present

## 2020-12-28 DIAGNOSIS — Z961 Presence of intraocular lens: Secondary | ICD-10-CM | POA: Diagnosis not present

## 2020-12-28 DIAGNOSIS — M8580 Other specified disorders of bone density and structure, unspecified site: Secondary | ICD-10-CM | POA: Diagnosis not present

## 2020-12-28 DIAGNOSIS — F419 Anxiety disorder, unspecified: Secondary | ICD-10-CM | POA: Diagnosis not present

## 2020-12-28 DIAGNOSIS — F0390 Unspecified dementia without behavioral disturbance: Secondary | ICD-10-CM | POA: Diagnosis not present

## 2020-12-28 DIAGNOSIS — M40204 Unspecified kyphosis, thoracic region: Secondary | ICD-10-CM | POA: Diagnosis not present

## 2020-12-28 DIAGNOSIS — N189 Chronic kidney disease, unspecified: Secondary | ICD-10-CM | POA: Diagnosis not present

## 2020-12-28 DIAGNOSIS — G629 Polyneuropathy, unspecified: Secondary | ICD-10-CM | POA: Diagnosis not present

## 2020-12-28 DIAGNOSIS — I129 Hypertensive chronic kidney disease with stage 1 through stage 4 chronic kidney disease, or unspecified chronic kidney disease: Secondary | ICD-10-CM | POA: Diagnosis not present

## 2020-12-28 DIAGNOSIS — Z96653 Presence of artificial knee joint, bilateral: Secondary | ICD-10-CM | POA: Diagnosis not present

## 2020-12-28 DIAGNOSIS — Z87440 Personal history of urinary (tract) infections: Secondary | ICD-10-CM | POA: Diagnosis not present

## 2020-12-29 DIAGNOSIS — F0390 Unspecified dementia without behavioral disturbance: Secondary | ICD-10-CM | POA: Diagnosis not present

## 2020-12-29 DIAGNOSIS — N189 Chronic kidney disease, unspecified: Secondary | ICD-10-CM | POA: Diagnosis not present

## 2020-12-29 DIAGNOSIS — I129 Hypertensive chronic kidney disease with stage 1 through stage 4 chronic kidney disease, or unspecified chronic kidney disease: Secondary | ICD-10-CM | POA: Diagnosis not present

## 2020-12-29 DIAGNOSIS — L97811 Non-pressure chronic ulcer of other part of right lower leg limited to breakdown of skin: Secondary | ICD-10-CM | POA: Diagnosis not present

## 2020-12-29 DIAGNOSIS — I872 Venous insufficiency (chronic) (peripheral): Secondary | ICD-10-CM | POA: Diagnosis not present

## 2020-12-29 DIAGNOSIS — L97821 Non-pressure chronic ulcer of other part of left lower leg limited to breakdown of skin: Secondary | ICD-10-CM | POA: Diagnosis not present

## 2021-01-02 DIAGNOSIS — N189 Chronic kidney disease, unspecified: Secondary | ICD-10-CM | POA: Diagnosis not present

## 2021-01-02 DIAGNOSIS — I872 Venous insufficiency (chronic) (peripheral): Secondary | ICD-10-CM | POA: Diagnosis not present

## 2021-01-02 DIAGNOSIS — F0390 Unspecified dementia without behavioral disturbance: Secondary | ICD-10-CM | POA: Diagnosis not present

## 2021-01-02 DIAGNOSIS — L97811 Non-pressure chronic ulcer of other part of right lower leg limited to breakdown of skin: Secondary | ICD-10-CM | POA: Diagnosis not present

## 2021-01-02 DIAGNOSIS — L97821 Non-pressure chronic ulcer of other part of left lower leg limited to breakdown of skin: Secondary | ICD-10-CM | POA: Diagnosis not present

## 2021-01-02 DIAGNOSIS — I129 Hypertensive chronic kidney disease with stage 1 through stage 4 chronic kidney disease, or unspecified chronic kidney disease: Secondary | ICD-10-CM | POA: Diagnosis not present

## 2021-01-03 DIAGNOSIS — H01111 Allergic dermatitis of right upper eyelid: Secondary | ICD-10-CM | POA: Diagnosis not present

## 2021-01-05 DIAGNOSIS — I129 Hypertensive chronic kidney disease with stage 1 through stage 4 chronic kidney disease, or unspecified chronic kidney disease: Secondary | ICD-10-CM | POA: Diagnosis not present

## 2021-01-05 DIAGNOSIS — L97821 Non-pressure chronic ulcer of other part of left lower leg limited to breakdown of skin: Secondary | ICD-10-CM | POA: Diagnosis not present

## 2021-01-05 DIAGNOSIS — I872 Venous insufficiency (chronic) (peripheral): Secondary | ICD-10-CM | POA: Diagnosis not present

## 2021-01-05 DIAGNOSIS — N189 Chronic kidney disease, unspecified: Secondary | ICD-10-CM | POA: Diagnosis not present

## 2021-01-05 DIAGNOSIS — L97811 Non-pressure chronic ulcer of other part of right lower leg limited to breakdown of skin: Secondary | ICD-10-CM | POA: Diagnosis not present

## 2021-01-05 DIAGNOSIS — F0390 Unspecified dementia without behavioral disturbance: Secondary | ICD-10-CM | POA: Diagnosis not present

## 2021-01-09 DIAGNOSIS — L97821 Non-pressure chronic ulcer of other part of left lower leg limited to breakdown of skin: Secondary | ICD-10-CM | POA: Diagnosis not present

## 2021-01-09 DIAGNOSIS — I872 Venous insufficiency (chronic) (peripheral): Secondary | ICD-10-CM | POA: Diagnosis not present

## 2021-01-09 DIAGNOSIS — N189 Chronic kidney disease, unspecified: Secondary | ICD-10-CM | POA: Diagnosis not present

## 2021-01-09 DIAGNOSIS — I129 Hypertensive chronic kidney disease with stage 1 through stage 4 chronic kidney disease, or unspecified chronic kidney disease: Secondary | ICD-10-CM | POA: Diagnosis not present

## 2021-01-09 DIAGNOSIS — L97811 Non-pressure chronic ulcer of other part of right lower leg limited to breakdown of skin: Secondary | ICD-10-CM | POA: Diagnosis not present

## 2021-01-09 DIAGNOSIS — F0390 Unspecified dementia without behavioral disturbance: Secondary | ICD-10-CM | POA: Diagnosis not present

## 2021-01-11 DIAGNOSIS — I83019 Varicose veins of right lower extremity with ulcer of unspecified site: Secondary | ICD-10-CM | POA: Diagnosis not present

## 2021-01-11 DIAGNOSIS — L97919 Non-pressure chronic ulcer of unspecified part of right lower leg with unspecified severity: Secondary | ICD-10-CM | POA: Diagnosis not present

## 2021-01-11 DIAGNOSIS — Z880 Allergy status to penicillin: Secondary | ICD-10-CM | POA: Diagnosis not present

## 2021-01-11 DIAGNOSIS — R609 Edema, unspecified: Secondary | ICD-10-CM | POA: Diagnosis not present

## 2021-01-11 DIAGNOSIS — Z888 Allergy status to other drugs, medicaments and biological substances status: Secondary | ICD-10-CM | POA: Diagnosis not present

## 2021-01-11 DIAGNOSIS — L97212 Non-pressure chronic ulcer of right calf with fat layer exposed: Secondary | ICD-10-CM | POA: Diagnosis not present

## 2021-01-11 DIAGNOSIS — I83891 Varicose veins of right lower extremities with other complications: Secondary | ICD-10-CM | POA: Diagnosis not present

## 2021-01-11 DIAGNOSIS — L97222 Non-pressure chronic ulcer of left calf with fat layer exposed: Secondary | ICD-10-CM | POA: Diagnosis not present

## 2021-01-16 DIAGNOSIS — L97811 Non-pressure chronic ulcer of other part of right lower leg limited to breakdown of skin: Secondary | ICD-10-CM | POA: Diagnosis not present

## 2021-01-16 DIAGNOSIS — I129 Hypertensive chronic kidney disease with stage 1 through stage 4 chronic kidney disease, or unspecified chronic kidney disease: Secondary | ICD-10-CM | POA: Diagnosis not present

## 2021-01-16 DIAGNOSIS — F0390 Unspecified dementia without behavioral disturbance: Secondary | ICD-10-CM | POA: Diagnosis not present

## 2021-01-16 DIAGNOSIS — L97821 Non-pressure chronic ulcer of other part of left lower leg limited to breakdown of skin: Secondary | ICD-10-CM | POA: Diagnosis not present

## 2021-01-16 DIAGNOSIS — N189 Chronic kidney disease, unspecified: Secondary | ICD-10-CM | POA: Diagnosis not present

## 2021-01-16 DIAGNOSIS — I872 Venous insufficiency (chronic) (peripheral): Secondary | ICD-10-CM | POA: Diagnosis not present

## 2021-01-19 DIAGNOSIS — L97811 Non-pressure chronic ulcer of other part of right lower leg limited to breakdown of skin: Secondary | ICD-10-CM | POA: Diagnosis not present

## 2021-01-19 DIAGNOSIS — I872 Venous insufficiency (chronic) (peripheral): Secondary | ICD-10-CM | POA: Diagnosis not present

## 2021-01-19 DIAGNOSIS — N189 Chronic kidney disease, unspecified: Secondary | ICD-10-CM | POA: Diagnosis not present

## 2021-01-19 DIAGNOSIS — L97821 Non-pressure chronic ulcer of other part of left lower leg limited to breakdown of skin: Secondary | ICD-10-CM | POA: Diagnosis not present

## 2021-01-19 DIAGNOSIS — I129 Hypertensive chronic kidney disease with stage 1 through stage 4 chronic kidney disease, or unspecified chronic kidney disease: Secondary | ICD-10-CM | POA: Diagnosis not present

## 2021-01-19 DIAGNOSIS — F0390 Unspecified dementia without behavioral disturbance: Secondary | ICD-10-CM | POA: Diagnosis not present

## 2021-01-23 DIAGNOSIS — N189 Chronic kidney disease, unspecified: Secondary | ICD-10-CM | POA: Diagnosis not present

## 2021-01-23 DIAGNOSIS — F0390 Unspecified dementia without behavioral disturbance: Secondary | ICD-10-CM | POA: Diagnosis not present

## 2021-01-23 DIAGNOSIS — L97821 Non-pressure chronic ulcer of other part of left lower leg limited to breakdown of skin: Secondary | ICD-10-CM | POA: Diagnosis not present

## 2021-01-23 DIAGNOSIS — L97811 Non-pressure chronic ulcer of other part of right lower leg limited to breakdown of skin: Secondary | ICD-10-CM | POA: Diagnosis not present

## 2021-01-23 DIAGNOSIS — I872 Venous insufficiency (chronic) (peripheral): Secondary | ICD-10-CM | POA: Diagnosis not present

## 2021-01-23 DIAGNOSIS — I129 Hypertensive chronic kidney disease with stage 1 through stage 4 chronic kidney disease, or unspecified chronic kidney disease: Secondary | ICD-10-CM | POA: Diagnosis not present

## 2021-01-25 DIAGNOSIS — F0390 Unspecified dementia without behavioral disturbance: Secondary | ICD-10-CM | POA: Diagnosis not present

## 2021-01-25 DIAGNOSIS — L97811 Non-pressure chronic ulcer of other part of right lower leg limited to breakdown of skin: Secondary | ICD-10-CM | POA: Diagnosis not present

## 2021-01-25 DIAGNOSIS — I129 Hypertensive chronic kidney disease with stage 1 through stage 4 chronic kidney disease, or unspecified chronic kidney disease: Secondary | ICD-10-CM | POA: Diagnosis not present

## 2021-01-25 DIAGNOSIS — L97821 Non-pressure chronic ulcer of other part of left lower leg limited to breakdown of skin: Secondary | ICD-10-CM | POA: Diagnosis not present

## 2021-01-25 DIAGNOSIS — N189 Chronic kidney disease, unspecified: Secondary | ICD-10-CM | POA: Diagnosis not present

## 2021-01-25 DIAGNOSIS — I872 Venous insufficiency (chronic) (peripheral): Secondary | ICD-10-CM | POA: Diagnosis not present

## 2021-01-27 DIAGNOSIS — R001 Bradycardia, unspecified: Secondary | ICD-10-CM | POA: Diagnosis not present

## 2021-01-27 DIAGNOSIS — J309 Allergic rhinitis, unspecified: Secondary | ICD-10-CM | POA: Diagnosis not present

## 2021-01-27 DIAGNOSIS — N189 Chronic kidney disease, unspecified: Secondary | ICD-10-CM | POA: Diagnosis not present

## 2021-01-27 DIAGNOSIS — F0390 Unspecified dementia without behavioral disturbance: Secondary | ICD-10-CM | POA: Diagnosis not present

## 2021-01-27 DIAGNOSIS — Z96653 Presence of artificial knee joint, bilateral: Secondary | ICD-10-CM | POA: Diagnosis not present

## 2021-01-27 DIAGNOSIS — I129 Hypertensive chronic kidney disease with stage 1 through stage 4 chronic kidney disease, or unspecified chronic kidney disease: Secondary | ICD-10-CM | POA: Diagnosis not present

## 2021-01-27 DIAGNOSIS — F419 Anxiety disorder, unspecified: Secondary | ICD-10-CM | POA: Diagnosis not present

## 2021-01-27 DIAGNOSIS — M40204 Unspecified kyphosis, thoracic region: Secondary | ICD-10-CM | POA: Diagnosis not present

## 2021-01-27 DIAGNOSIS — G629 Polyneuropathy, unspecified: Secondary | ICD-10-CM | POA: Diagnosis not present

## 2021-01-27 DIAGNOSIS — I872 Venous insufficiency (chronic) (peripheral): Secondary | ICD-10-CM | POA: Diagnosis not present

## 2021-01-27 DIAGNOSIS — K219 Gastro-esophageal reflux disease without esophagitis: Secondary | ICD-10-CM | POA: Diagnosis not present

## 2021-01-27 DIAGNOSIS — L97812 Non-pressure chronic ulcer of other part of right lower leg with fat layer exposed: Secondary | ICD-10-CM | POA: Diagnosis not present

## 2021-01-27 DIAGNOSIS — M8580 Other specified disorders of bone density and structure, unspecified site: Secondary | ICD-10-CM | POA: Diagnosis not present

## 2021-01-27 DIAGNOSIS — Z87440 Personal history of urinary (tract) infections: Secondary | ICD-10-CM | POA: Diagnosis not present

## 2021-01-27 DIAGNOSIS — Z961 Presence of intraocular lens: Secondary | ICD-10-CM | POA: Diagnosis not present

## 2021-01-27 DIAGNOSIS — G4700 Insomnia, unspecified: Secondary | ICD-10-CM | POA: Diagnosis not present

## 2021-01-27 DIAGNOSIS — L97822 Non-pressure chronic ulcer of other part of left lower leg with fat layer exposed: Secondary | ICD-10-CM | POA: Diagnosis not present

## 2021-01-30 DIAGNOSIS — F0390 Unspecified dementia without behavioral disturbance: Secondary | ICD-10-CM | POA: Diagnosis not present

## 2021-01-30 DIAGNOSIS — L97822 Non-pressure chronic ulcer of other part of left lower leg with fat layer exposed: Secondary | ICD-10-CM | POA: Diagnosis not present

## 2021-01-30 DIAGNOSIS — N189 Chronic kidney disease, unspecified: Secondary | ICD-10-CM | POA: Diagnosis not present

## 2021-01-30 DIAGNOSIS — I129 Hypertensive chronic kidney disease with stage 1 through stage 4 chronic kidney disease, or unspecified chronic kidney disease: Secondary | ICD-10-CM | POA: Diagnosis not present

## 2021-01-30 DIAGNOSIS — L97812 Non-pressure chronic ulcer of other part of right lower leg with fat layer exposed: Secondary | ICD-10-CM | POA: Diagnosis not present

## 2021-01-30 DIAGNOSIS — I872 Venous insufficiency (chronic) (peripheral): Secondary | ICD-10-CM | POA: Diagnosis not present

## 2021-02-02 DIAGNOSIS — F0390 Unspecified dementia without behavioral disturbance: Secondary | ICD-10-CM | POA: Diagnosis not present

## 2021-02-02 DIAGNOSIS — I872 Venous insufficiency (chronic) (peripheral): Secondary | ICD-10-CM | POA: Diagnosis not present

## 2021-02-02 DIAGNOSIS — N189 Chronic kidney disease, unspecified: Secondary | ICD-10-CM | POA: Diagnosis not present

## 2021-02-02 DIAGNOSIS — L97812 Non-pressure chronic ulcer of other part of right lower leg with fat layer exposed: Secondary | ICD-10-CM | POA: Diagnosis not present

## 2021-02-02 DIAGNOSIS — L97822 Non-pressure chronic ulcer of other part of left lower leg with fat layer exposed: Secondary | ICD-10-CM | POA: Diagnosis not present

## 2021-02-02 DIAGNOSIS — I129 Hypertensive chronic kidney disease with stage 1 through stage 4 chronic kidney disease, or unspecified chronic kidney disease: Secondary | ICD-10-CM | POA: Diagnosis not present

## 2021-02-06 DIAGNOSIS — L97812 Non-pressure chronic ulcer of other part of right lower leg with fat layer exposed: Secondary | ICD-10-CM | POA: Diagnosis not present

## 2021-02-06 DIAGNOSIS — N189 Chronic kidney disease, unspecified: Secondary | ICD-10-CM | POA: Diagnosis not present

## 2021-02-06 DIAGNOSIS — F0390 Unspecified dementia without behavioral disturbance: Secondary | ICD-10-CM | POA: Diagnosis not present

## 2021-02-06 DIAGNOSIS — I129 Hypertensive chronic kidney disease with stage 1 through stage 4 chronic kidney disease, or unspecified chronic kidney disease: Secondary | ICD-10-CM | POA: Diagnosis not present

## 2021-02-06 DIAGNOSIS — L97822 Non-pressure chronic ulcer of other part of left lower leg with fat layer exposed: Secondary | ICD-10-CM | POA: Diagnosis not present

## 2021-02-06 DIAGNOSIS — I872 Venous insufficiency (chronic) (peripheral): Secondary | ICD-10-CM | POA: Diagnosis not present

## 2021-02-08 DIAGNOSIS — I83891 Varicose veins of right lower extremities with other complications: Secondary | ICD-10-CM | POA: Diagnosis not present

## 2021-02-08 DIAGNOSIS — I872 Venous insufficiency (chronic) (peripheral): Secondary | ICD-10-CM | POA: Diagnosis not present

## 2021-02-08 DIAGNOSIS — L97212 Non-pressure chronic ulcer of right calf with fat layer exposed: Secondary | ICD-10-CM | POA: Diagnosis not present

## 2021-02-08 DIAGNOSIS — L97919 Non-pressure chronic ulcer of unspecified part of right lower leg with unspecified severity: Secondary | ICD-10-CM | POA: Diagnosis not present

## 2021-02-08 DIAGNOSIS — L97222 Non-pressure chronic ulcer of left calf with fat layer exposed: Secondary | ICD-10-CM | POA: Diagnosis not present

## 2021-02-08 DIAGNOSIS — I83019 Varicose veins of right lower extremity with ulcer of unspecified site: Secondary | ICD-10-CM | POA: Diagnosis not present

## 2021-02-08 DIAGNOSIS — R609 Edema, unspecified: Secondary | ICD-10-CM | POA: Diagnosis not present

## 2021-02-13 DIAGNOSIS — F0390 Unspecified dementia without behavioral disturbance: Secondary | ICD-10-CM | POA: Diagnosis not present

## 2021-02-13 DIAGNOSIS — I129 Hypertensive chronic kidney disease with stage 1 through stage 4 chronic kidney disease, or unspecified chronic kidney disease: Secondary | ICD-10-CM | POA: Diagnosis not present

## 2021-02-13 DIAGNOSIS — I872 Venous insufficiency (chronic) (peripheral): Secondary | ICD-10-CM | POA: Diagnosis not present

## 2021-02-13 DIAGNOSIS — L97822 Non-pressure chronic ulcer of other part of left lower leg with fat layer exposed: Secondary | ICD-10-CM | POA: Diagnosis not present

## 2021-02-13 DIAGNOSIS — L97812 Non-pressure chronic ulcer of other part of right lower leg with fat layer exposed: Secondary | ICD-10-CM | POA: Diagnosis not present

## 2021-02-13 DIAGNOSIS — N189 Chronic kidney disease, unspecified: Secondary | ICD-10-CM | POA: Diagnosis not present

## 2021-02-16 DIAGNOSIS — L97812 Non-pressure chronic ulcer of other part of right lower leg with fat layer exposed: Secondary | ICD-10-CM | POA: Diagnosis not present

## 2021-02-16 DIAGNOSIS — N189 Chronic kidney disease, unspecified: Secondary | ICD-10-CM | POA: Diagnosis not present

## 2021-02-16 DIAGNOSIS — L97822 Non-pressure chronic ulcer of other part of left lower leg with fat layer exposed: Secondary | ICD-10-CM | POA: Diagnosis not present

## 2021-02-16 DIAGNOSIS — I129 Hypertensive chronic kidney disease with stage 1 through stage 4 chronic kidney disease, or unspecified chronic kidney disease: Secondary | ICD-10-CM | POA: Diagnosis not present

## 2021-02-16 DIAGNOSIS — F0390 Unspecified dementia without behavioral disturbance: Secondary | ICD-10-CM | POA: Diagnosis not present

## 2021-02-16 DIAGNOSIS — I872 Venous insufficiency (chronic) (peripheral): Secondary | ICD-10-CM | POA: Diagnosis not present

## 2021-02-20 DIAGNOSIS — L97812 Non-pressure chronic ulcer of other part of right lower leg with fat layer exposed: Secondary | ICD-10-CM | POA: Diagnosis not present

## 2021-02-20 DIAGNOSIS — F0390 Unspecified dementia without behavioral disturbance: Secondary | ICD-10-CM | POA: Diagnosis not present

## 2021-02-20 DIAGNOSIS — L97822 Non-pressure chronic ulcer of other part of left lower leg with fat layer exposed: Secondary | ICD-10-CM | POA: Diagnosis not present

## 2021-02-20 DIAGNOSIS — I872 Venous insufficiency (chronic) (peripheral): Secondary | ICD-10-CM | POA: Diagnosis not present

## 2021-02-20 DIAGNOSIS — I129 Hypertensive chronic kidney disease with stage 1 through stage 4 chronic kidney disease, or unspecified chronic kidney disease: Secondary | ICD-10-CM | POA: Diagnosis not present

## 2021-02-20 DIAGNOSIS — N189 Chronic kidney disease, unspecified: Secondary | ICD-10-CM | POA: Diagnosis not present

## 2021-02-23 DIAGNOSIS — L97822 Non-pressure chronic ulcer of other part of left lower leg with fat layer exposed: Secondary | ICD-10-CM | POA: Diagnosis not present

## 2021-02-23 DIAGNOSIS — F0390 Unspecified dementia without behavioral disturbance: Secondary | ICD-10-CM | POA: Diagnosis not present

## 2021-02-23 DIAGNOSIS — L97812 Non-pressure chronic ulcer of other part of right lower leg with fat layer exposed: Secondary | ICD-10-CM | POA: Diagnosis not present

## 2021-02-23 DIAGNOSIS — I872 Venous insufficiency (chronic) (peripheral): Secondary | ICD-10-CM | POA: Diagnosis not present

## 2021-02-23 DIAGNOSIS — I129 Hypertensive chronic kidney disease with stage 1 through stage 4 chronic kidney disease, or unspecified chronic kidney disease: Secondary | ICD-10-CM | POA: Diagnosis not present

## 2021-02-23 DIAGNOSIS — N189 Chronic kidney disease, unspecified: Secondary | ICD-10-CM | POA: Diagnosis not present

## 2021-02-26 DIAGNOSIS — R001 Bradycardia, unspecified: Secondary | ICD-10-CM | POA: Diagnosis not present

## 2021-02-26 DIAGNOSIS — N189 Chronic kidney disease, unspecified: Secondary | ICD-10-CM | POA: Diagnosis not present

## 2021-02-26 DIAGNOSIS — G4700 Insomnia, unspecified: Secondary | ICD-10-CM | POA: Diagnosis not present

## 2021-02-26 DIAGNOSIS — I872 Venous insufficiency (chronic) (peripheral): Secondary | ICD-10-CM | POA: Diagnosis not present

## 2021-02-26 DIAGNOSIS — I129 Hypertensive chronic kidney disease with stage 1 through stage 4 chronic kidney disease, or unspecified chronic kidney disease: Secondary | ICD-10-CM | POA: Diagnosis not present

## 2021-02-26 DIAGNOSIS — M40204 Unspecified kyphosis, thoracic region: Secondary | ICD-10-CM | POA: Diagnosis not present

## 2021-02-26 DIAGNOSIS — F419 Anxiety disorder, unspecified: Secondary | ICD-10-CM | POA: Diagnosis not present

## 2021-02-26 DIAGNOSIS — G629 Polyneuropathy, unspecified: Secondary | ICD-10-CM | POA: Diagnosis not present

## 2021-02-26 DIAGNOSIS — Z96653 Presence of artificial knee joint, bilateral: Secondary | ICD-10-CM | POA: Diagnosis not present

## 2021-02-26 DIAGNOSIS — Z87440 Personal history of urinary (tract) infections: Secondary | ICD-10-CM | POA: Diagnosis not present

## 2021-02-26 DIAGNOSIS — J309 Allergic rhinitis, unspecified: Secondary | ICD-10-CM | POA: Diagnosis not present

## 2021-02-26 DIAGNOSIS — F0390 Unspecified dementia without behavioral disturbance: Secondary | ICD-10-CM | POA: Diagnosis not present

## 2021-02-26 DIAGNOSIS — M8580 Other specified disorders of bone density and structure, unspecified site: Secondary | ICD-10-CM | POA: Diagnosis not present

## 2021-02-26 DIAGNOSIS — Z961 Presence of intraocular lens: Secondary | ICD-10-CM | POA: Diagnosis not present

## 2021-02-26 DIAGNOSIS — L97812 Non-pressure chronic ulcer of other part of right lower leg with fat layer exposed: Secondary | ICD-10-CM | POA: Diagnosis not present

## 2021-02-26 DIAGNOSIS — L97822 Non-pressure chronic ulcer of other part of left lower leg with fat layer exposed: Secondary | ICD-10-CM | POA: Diagnosis not present

## 2021-02-26 DIAGNOSIS — K219 Gastro-esophageal reflux disease without esophagitis: Secondary | ICD-10-CM | POA: Diagnosis not present

## 2021-02-27 DIAGNOSIS — N189 Chronic kidney disease, unspecified: Secondary | ICD-10-CM | POA: Diagnosis not present

## 2021-02-27 DIAGNOSIS — I872 Venous insufficiency (chronic) (peripheral): Secondary | ICD-10-CM | POA: Diagnosis not present

## 2021-02-27 DIAGNOSIS — L97812 Non-pressure chronic ulcer of other part of right lower leg with fat layer exposed: Secondary | ICD-10-CM | POA: Diagnosis not present

## 2021-02-27 DIAGNOSIS — L97822 Non-pressure chronic ulcer of other part of left lower leg with fat layer exposed: Secondary | ICD-10-CM | POA: Diagnosis not present

## 2021-02-27 DIAGNOSIS — F0390 Unspecified dementia without behavioral disturbance: Secondary | ICD-10-CM | POA: Diagnosis not present

## 2021-02-27 DIAGNOSIS — I129 Hypertensive chronic kidney disease with stage 1 through stage 4 chronic kidney disease, or unspecified chronic kidney disease: Secondary | ICD-10-CM | POA: Diagnosis not present

## 2021-03-02 DIAGNOSIS — L97812 Non-pressure chronic ulcer of other part of right lower leg with fat layer exposed: Secondary | ICD-10-CM | POA: Diagnosis not present

## 2021-03-02 DIAGNOSIS — I872 Venous insufficiency (chronic) (peripheral): Secondary | ICD-10-CM | POA: Diagnosis not present

## 2021-03-02 DIAGNOSIS — I129 Hypertensive chronic kidney disease with stage 1 through stage 4 chronic kidney disease, or unspecified chronic kidney disease: Secondary | ICD-10-CM | POA: Diagnosis not present

## 2021-03-02 DIAGNOSIS — N189 Chronic kidney disease, unspecified: Secondary | ICD-10-CM | POA: Diagnosis not present

## 2021-03-02 DIAGNOSIS — F0390 Unspecified dementia without behavioral disturbance: Secondary | ICD-10-CM | POA: Diagnosis not present

## 2021-03-02 DIAGNOSIS — L97822 Non-pressure chronic ulcer of other part of left lower leg with fat layer exposed: Secondary | ICD-10-CM | POA: Diagnosis not present

## 2021-03-06 DIAGNOSIS — L97822 Non-pressure chronic ulcer of other part of left lower leg with fat layer exposed: Secondary | ICD-10-CM | POA: Diagnosis not present

## 2021-03-06 DIAGNOSIS — N189 Chronic kidney disease, unspecified: Secondary | ICD-10-CM | POA: Diagnosis not present

## 2021-03-06 DIAGNOSIS — L97812 Non-pressure chronic ulcer of other part of right lower leg with fat layer exposed: Secondary | ICD-10-CM | POA: Diagnosis not present

## 2021-03-06 DIAGNOSIS — F0390 Unspecified dementia without behavioral disturbance: Secondary | ICD-10-CM | POA: Diagnosis not present

## 2021-03-06 DIAGNOSIS — I129 Hypertensive chronic kidney disease with stage 1 through stage 4 chronic kidney disease, or unspecified chronic kidney disease: Secondary | ICD-10-CM | POA: Diagnosis not present

## 2021-03-06 DIAGNOSIS — I872 Venous insufficiency (chronic) (peripheral): Secondary | ICD-10-CM | POA: Diagnosis not present

## 2021-03-07 DIAGNOSIS — H524 Presbyopia: Secondary | ICD-10-CM | POA: Diagnosis not present

## 2021-03-07 DIAGNOSIS — H401132 Primary open-angle glaucoma, bilateral, moderate stage: Secondary | ICD-10-CM | POA: Diagnosis not present

## 2021-03-08 DIAGNOSIS — I83019 Varicose veins of right lower extremity with ulcer of unspecified site: Secondary | ICD-10-CM | POA: Diagnosis not present

## 2021-03-08 DIAGNOSIS — L97919 Non-pressure chronic ulcer of unspecified part of right lower leg with unspecified severity: Secondary | ICD-10-CM | POA: Diagnosis not present

## 2021-03-08 DIAGNOSIS — L97212 Non-pressure chronic ulcer of right calf with fat layer exposed: Secondary | ICD-10-CM | POA: Diagnosis not present

## 2021-03-08 DIAGNOSIS — I83891 Varicose veins of right lower extremities with other complications: Secondary | ICD-10-CM | POA: Diagnosis not present

## 2021-03-08 DIAGNOSIS — R609 Edema, unspecified: Secondary | ICD-10-CM | POA: Diagnosis not present

## 2021-03-08 DIAGNOSIS — L97222 Non-pressure chronic ulcer of left calf with fat layer exposed: Secondary | ICD-10-CM | POA: Diagnosis not present

## 2021-03-08 DIAGNOSIS — I872 Venous insufficiency (chronic) (peripheral): Secondary | ICD-10-CM | POA: Diagnosis not present

## 2021-03-13 DIAGNOSIS — L97822 Non-pressure chronic ulcer of other part of left lower leg with fat layer exposed: Secondary | ICD-10-CM | POA: Diagnosis not present

## 2021-03-13 DIAGNOSIS — I872 Venous insufficiency (chronic) (peripheral): Secondary | ICD-10-CM | POA: Diagnosis not present

## 2021-03-13 DIAGNOSIS — N189 Chronic kidney disease, unspecified: Secondary | ICD-10-CM | POA: Diagnosis not present

## 2021-03-13 DIAGNOSIS — I129 Hypertensive chronic kidney disease with stage 1 through stage 4 chronic kidney disease, or unspecified chronic kidney disease: Secondary | ICD-10-CM | POA: Diagnosis not present

## 2021-03-13 DIAGNOSIS — L97812 Non-pressure chronic ulcer of other part of right lower leg with fat layer exposed: Secondary | ICD-10-CM | POA: Diagnosis not present

## 2021-03-13 DIAGNOSIS — F0390 Unspecified dementia without behavioral disturbance: Secondary | ICD-10-CM | POA: Diagnosis not present

## 2021-03-16 DIAGNOSIS — L97822 Non-pressure chronic ulcer of other part of left lower leg with fat layer exposed: Secondary | ICD-10-CM | POA: Diagnosis not present

## 2021-03-16 DIAGNOSIS — L97812 Non-pressure chronic ulcer of other part of right lower leg with fat layer exposed: Secondary | ICD-10-CM | POA: Diagnosis not present

## 2021-03-16 DIAGNOSIS — I129 Hypertensive chronic kidney disease with stage 1 through stage 4 chronic kidney disease, or unspecified chronic kidney disease: Secondary | ICD-10-CM | POA: Diagnosis not present

## 2021-03-16 DIAGNOSIS — I872 Venous insufficiency (chronic) (peripheral): Secondary | ICD-10-CM | POA: Diagnosis not present

## 2021-03-16 DIAGNOSIS — N189 Chronic kidney disease, unspecified: Secondary | ICD-10-CM | POA: Diagnosis not present

## 2021-03-16 DIAGNOSIS — F0390 Unspecified dementia without behavioral disturbance: Secondary | ICD-10-CM | POA: Diagnosis not present

## 2021-03-20 DIAGNOSIS — F0390 Unspecified dementia without behavioral disturbance: Secondary | ICD-10-CM | POA: Diagnosis not present

## 2021-03-20 DIAGNOSIS — N189 Chronic kidney disease, unspecified: Secondary | ICD-10-CM | POA: Diagnosis not present

## 2021-03-20 DIAGNOSIS — I129 Hypertensive chronic kidney disease with stage 1 through stage 4 chronic kidney disease, or unspecified chronic kidney disease: Secondary | ICD-10-CM | POA: Diagnosis not present

## 2021-03-20 DIAGNOSIS — I872 Venous insufficiency (chronic) (peripheral): Secondary | ICD-10-CM | POA: Diagnosis not present

## 2021-03-20 DIAGNOSIS — L97812 Non-pressure chronic ulcer of other part of right lower leg with fat layer exposed: Secondary | ICD-10-CM | POA: Diagnosis not present

## 2021-03-20 DIAGNOSIS — L97822 Non-pressure chronic ulcer of other part of left lower leg with fat layer exposed: Secondary | ICD-10-CM | POA: Diagnosis not present

## 2021-03-23 DIAGNOSIS — N189 Chronic kidney disease, unspecified: Secondary | ICD-10-CM | POA: Diagnosis not present

## 2021-03-23 DIAGNOSIS — I129 Hypertensive chronic kidney disease with stage 1 through stage 4 chronic kidney disease, or unspecified chronic kidney disease: Secondary | ICD-10-CM | POA: Diagnosis not present

## 2021-03-23 DIAGNOSIS — F0390 Unspecified dementia without behavioral disturbance: Secondary | ICD-10-CM | POA: Diagnosis not present

## 2021-03-23 DIAGNOSIS — L97812 Non-pressure chronic ulcer of other part of right lower leg with fat layer exposed: Secondary | ICD-10-CM | POA: Diagnosis not present

## 2021-03-23 DIAGNOSIS — I872 Venous insufficiency (chronic) (peripheral): Secondary | ICD-10-CM | POA: Diagnosis not present

## 2021-03-23 DIAGNOSIS — L97822 Non-pressure chronic ulcer of other part of left lower leg with fat layer exposed: Secondary | ICD-10-CM | POA: Diagnosis not present

## 2021-03-27 DIAGNOSIS — I872 Venous insufficiency (chronic) (peripheral): Secondary | ICD-10-CM | POA: Diagnosis not present

## 2021-03-27 DIAGNOSIS — F0390 Unspecified dementia without behavioral disturbance: Secondary | ICD-10-CM | POA: Diagnosis not present

## 2021-03-27 DIAGNOSIS — L97812 Non-pressure chronic ulcer of other part of right lower leg with fat layer exposed: Secondary | ICD-10-CM | POA: Diagnosis not present

## 2021-03-27 DIAGNOSIS — I129 Hypertensive chronic kidney disease with stage 1 through stage 4 chronic kidney disease, or unspecified chronic kidney disease: Secondary | ICD-10-CM | POA: Diagnosis not present

## 2021-03-27 DIAGNOSIS — L97822 Non-pressure chronic ulcer of other part of left lower leg with fat layer exposed: Secondary | ICD-10-CM | POA: Diagnosis not present

## 2021-03-27 DIAGNOSIS — N189 Chronic kidney disease, unspecified: Secondary | ICD-10-CM | POA: Diagnosis not present

## 2021-03-28 DIAGNOSIS — Z96653 Presence of artificial knee joint, bilateral: Secondary | ICD-10-CM | POA: Diagnosis not present

## 2021-03-28 DIAGNOSIS — G4700 Insomnia, unspecified: Secondary | ICD-10-CM | POA: Diagnosis not present

## 2021-03-28 DIAGNOSIS — Z87440 Personal history of urinary (tract) infections: Secondary | ICD-10-CM | POA: Diagnosis not present

## 2021-03-28 DIAGNOSIS — I872 Venous insufficiency (chronic) (peripheral): Secondary | ICD-10-CM | POA: Diagnosis not present

## 2021-03-28 DIAGNOSIS — N189 Chronic kidney disease, unspecified: Secondary | ICD-10-CM | POA: Diagnosis not present

## 2021-03-28 DIAGNOSIS — L97812 Non-pressure chronic ulcer of other part of right lower leg with fat layer exposed: Secondary | ICD-10-CM | POA: Diagnosis not present

## 2021-03-28 DIAGNOSIS — F419 Anxiety disorder, unspecified: Secondary | ICD-10-CM | POA: Diagnosis not present

## 2021-03-28 DIAGNOSIS — Z961 Presence of intraocular lens: Secondary | ICD-10-CM | POA: Diagnosis not present

## 2021-03-28 DIAGNOSIS — G629 Polyneuropathy, unspecified: Secondary | ICD-10-CM | POA: Diagnosis not present

## 2021-03-28 DIAGNOSIS — F0390 Unspecified dementia without behavioral disturbance: Secondary | ICD-10-CM | POA: Diagnosis not present

## 2021-03-28 DIAGNOSIS — L97822 Non-pressure chronic ulcer of other part of left lower leg with fat layer exposed: Secondary | ICD-10-CM | POA: Diagnosis not present

## 2021-03-28 DIAGNOSIS — R001 Bradycardia, unspecified: Secondary | ICD-10-CM | POA: Diagnosis not present

## 2021-03-28 DIAGNOSIS — I129 Hypertensive chronic kidney disease with stage 1 through stage 4 chronic kidney disease, or unspecified chronic kidney disease: Secondary | ICD-10-CM | POA: Diagnosis not present

## 2021-03-28 DIAGNOSIS — M8580 Other specified disorders of bone density and structure, unspecified site: Secondary | ICD-10-CM | POA: Diagnosis not present

## 2021-03-28 DIAGNOSIS — J309 Allergic rhinitis, unspecified: Secondary | ICD-10-CM | POA: Diagnosis not present

## 2021-03-28 DIAGNOSIS — M40204 Unspecified kyphosis, thoracic region: Secondary | ICD-10-CM | POA: Diagnosis not present

## 2021-03-28 DIAGNOSIS — K219 Gastro-esophageal reflux disease without esophagitis: Secondary | ICD-10-CM | POA: Diagnosis not present

## 2021-03-30 DIAGNOSIS — F0390 Unspecified dementia without behavioral disturbance: Secondary | ICD-10-CM | POA: Diagnosis not present

## 2021-03-30 DIAGNOSIS — L97822 Non-pressure chronic ulcer of other part of left lower leg with fat layer exposed: Secondary | ICD-10-CM | POA: Diagnosis not present

## 2021-03-30 DIAGNOSIS — L97812 Non-pressure chronic ulcer of other part of right lower leg with fat layer exposed: Secondary | ICD-10-CM | POA: Diagnosis not present

## 2021-03-30 DIAGNOSIS — I872 Venous insufficiency (chronic) (peripheral): Secondary | ICD-10-CM | POA: Diagnosis not present

## 2021-03-30 DIAGNOSIS — N189 Chronic kidney disease, unspecified: Secondary | ICD-10-CM | POA: Diagnosis not present

## 2021-03-30 DIAGNOSIS — I129 Hypertensive chronic kidney disease with stage 1 through stage 4 chronic kidney disease, or unspecified chronic kidney disease: Secondary | ICD-10-CM | POA: Diagnosis not present

## 2021-04-03 DIAGNOSIS — L97812 Non-pressure chronic ulcer of other part of right lower leg with fat layer exposed: Secondary | ICD-10-CM | POA: Diagnosis not present

## 2021-04-03 DIAGNOSIS — F0390 Unspecified dementia without behavioral disturbance: Secondary | ICD-10-CM | POA: Diagnosis not present

## 2021-04-03 DIAGNOSIS — L97822 Non-pressure chronic ulcer of other part of left lower leg with fat layer exposed: Secondary | ICD-10-CM | POA: Diagnosis not present

## 2021-04-03 DIAGNOSIS — I129 Hypertensive chronic kidney disease with stage 1 through stage 4 chronic kidney disease, or unspecified chronic kidney disease: Secondary | ICD-10-CM | POA: Diagnosis not present

## 2021-04-03 DIAGNOSIS — I872 Venous insufficiency (chronic) (peripheral): Secondary | ICD-10-CM | POA: Diagnosis not present

## 2021-04-03 DIAGNOSIS — N189 Chronic kidney disease, unspecified: Secondary | ICD-10-CM | POA: Diagnosis not present

## 2021-04-05 DIAGNOSIS — L97212 Non-pressure chronic ulcer of right calf with fat layer exposed: Secondary | ICD-10-CM | POA: Diagnosis not present

## 2021-04-05 DIAGNOSIS — I83019 Varicose veins of right lower extremity with ulcer of unspecified site: Secondary | ICD-10-CM | POA: Diagnosis not present

## 2021-04-05 DIAGNOSIS — L97222 Non-pressure chronic ulcer of left calf with fat layer exposed: Secondary | ICD-10-CM | POA: Diagnosis not present

## 2021-04-05 DIAGNOSIS — R609 Edema, unspecified: Secondary | ICD-10-CM | POA: Diagnosis not present

## 2021-04-05 DIAGNOSIS — L97822 Non-pressure chronic ulcer of other part of left lower leg with fat layer exposed: Secondary | ICD-10-CM | POA: Diagnosis not present

## 2021-04-05 DIAGNOSIS — L97812 Non-pressure chronic ulcer of other part of right lower leg with fat layer exposed: Secondary | ICD-10-CM | POA: Diagnosis not present

## 2021-04-05 DIAGNOSIS — L97919 Non-pressure chronic ulcer of unspecified part of right lower leg with unspecified severity: Secondary | ICD-10-CM | POA: Diagnosis not present

## 2021-04-05 DIAGNOSIS — I872 Venous insufficiency (chronic) (peripheral): Secondary | ICD-10-CM | POA: Diagnosis not present

## 2021-04-05 DIAGNOSIS — I83891 Varicose veins of right lower extremities with other complications: Secondary | ICD-10-CM | POA: Diagnosis not present

## 2021-04-10 DIAGNOSIS — L97822 Non-pressure chronic ulcer of other part of left lower leg with fat layer exposed: Secondary | ICD-10-CM | POA: Diagnosis not present

## 2021-04-10 DIAGNOSIS — N189 Chronic kidney disease, unspecified: Secondary | ICD-10-CM | POA: Diagnosis not present

## 2021-04-10 DIAGNOSIS — I129 Hypertensive chronic kidney disease with stage 1 through stage 4 chronic kidney disease, or unspecified chronic kidney disease: Secondary | ICD-10-CM | POA: Diagnosis not present

## 2021-04-10 DIAGNOSIS — I872 Venous insufficiency (chronic) (peripheral): Secondary | ICD-10-CM | POA: Diagnosis not present

## 2021-04-10 DIAGNOSIS — L97812 Non-pressure chronic ulcer of other part of right lower leg with fat layer exposed: Secondary | ICD-10-CM | POA: Diagnosis not present

## 2021-04-10 DIAGNOSIS — F0390 Unspecified dementia without behavioral disturbance: Secondary | ICD-10-CM | POA: Diagnosis not present

## 2021-04-13 DIAGNOSIS — I129 Hypertensive chronic kidney disease with stage 1 through stage 4 chronic kidney disease, or unspecified chronic kidney disease: Secondary | ICD-10-CM | POA: Diagnosis not present

## 2021-04-13 DIAGNOSIS — F0390 Unspecified dementia without behavioral disturbance: Secondary | ICD-10-CM | POA: Diagnosis not present

## 2021-04-13 DIAGNOSIS — I872 Venous insufficiency (chronic) (peripheral): Secondary | ICD-10-CM | POA: Diagnosis not present

## 2021-04-13 DIAGNOSIS — L97822 Non-pressure chronic ulcer of other part of left lower leg with fat layer exposed: Secondary | ICD-10-CM | POA: Diagnosis not present

## 2021-04-13 DIAGNOSIS — N189 Chronic kidney disease, unspecified: Secondary | ICD-10-CM | POA: Diagnosis not present

## 2021-04-13 DIAGNOSIS — L97812 Non-pressure chronic ulcer of other part of right lower leg with fat layer exposed: Secondary | ICD-10-CM | POA: Diagnosis not present

## 2021-04-17 DIAGNOSIS — F0390 Unspecified dementia without behavioral disturbance: Secondary | ICD-10-CM | POA: Diagnosis not present

## 2021-04-17 DIAGNOSIS — L97812 Non-pressure chronic ulcer of other part of right lower leg with fat layer exposed: Secondary | ICD-10-CM | POA: Diagnosis not present

## 2021-04-17 DIAGNOSIS — I129 Hypertensive chronic kidney disease with stage 1 through stage 4 chronic kidney disease, or unspecified chronic kidney disease: Secondary | ICD-10-CM | POA: Diagnosis not present

## 2021-04-17 DIAGNOSIS — L97822 Non-pressure chronic ulcer of other part of left lower leg with fat layer exposed: Secondary | ICD-10-CM | POA: Diagnosis not present

## 2021-04-17 DIAGNOSIS — I872 Venous insufficiency (chronic) (peripheral): Secondary | ICD-10-CM | POA: Diagnosis not present

## 2021-04-17 DIAGNOSIS — N189 Chronic kidney disease, unspecified: Secondary | ICD-10-CM | POA: Diagnosis not present

## 2021-04-20 DIAGNOSIS — L97812 Non-pressure chronic ulcer of other part of right lower leg with fat layer exposed: Secondary | ICD-10-CM | POA: Diagnosis not present

## 2021-04-20 DIAGNOSIS — I872 Venous insufficiency (chronic) (peripheral): Secondary | ICD-10-CM | POA: Diagnosis not present

## 2021-04-20 DIAGNOSIS — I129 Hypertensive chronic kidney disease with stage 1 through stage 4 chronic kidney disease, or unspecified chronic kidney disease: Secondary | ICD-10-CM | POA: Diagnosis not present

## 2021-04-20 DIAGNOSIS — N189 Chronic kidney disease, unspecified: Secondary | ICD-10-CM | POA: Diagnosis not present

## 2021-04-20 DIAGNOSIS — L97822 Non-pressure chronic ulcer of other part of left lower leg with fat layer exposed: Secondary | ICD-10-CM | POA: Diagnosis not present

## 2021-04-20 DIAGNOSIS — F0390 Unspecified dementia without behavioral disturbance: Secondary | ICD-10-CM | POA: Diagnosis not present

## 2021-04-25 DIAGNOSIS — F0390 Unspecified dementia without behavioral disturbance: Secondary | ICD-10-CM | POA: Diagnosis not present

## 2021-04-25 DIAGNOSIS — I129 Hypertensive chronic kidney disease with stage 1 through stage 4 chronic kidney disease, or unspecified chronic kidney disease: Secondary | ICD-10-CM | POA: Diagnosis not present

## 2021-04-25 DIAGNOSIS — I872 Venous insufficiency (chronic) (peripheral): Secondary | ICD-10-CM | POA: Diagnosis not present

## 2021-04-25 DIAGNOSIS — N189 Chronic kidney disease, unspecified: Secondary | ICD-10-CM | POA: Diagnosis not present

## 2021-04-25 DIAGNOSIS — L97822 Non-pressure chronic ulcer of other part of left lower leg with fat layer exposed: Secondary | ICD-10-CM | POA: Diagnosis not present

## 2021-04-25 DIAGNOSIS — L97812 Non-pressure chronic ulcer of other part of right lower leg with fat layer exposed: Secondary | ICD-10-CM | POA: Diagnosis not present

## 2021-04-27 DIAGNOSIS — M40204 Unspecified kyphosis, thoracic region: Secondary | ICD-10-CM | POA: Diagnosis not present

## 2021-04-27 DIAGNOSIS — K219 Gastro-esophageal reflux disease without esophagitis: Secondary | ICD-10-CM | POA: Diagnosis not present

## 2021-04-27 DIAGNOSIS — G629 Polyneuropathy, unspecified: Secondary | ICD-10-CM | POA: Diagnosis not present

## 2021-04-27 DIAGNOSIS — J309 Allergic rhinitis, unspecified: Secondary | ICD-10-CM | POA: Diagnosis not present

## 2021-04-27 DIAGNOSIS — Z96653 Presence of artificial knee joint, bilateral: Secondary | ICD-10-CM | POA: Diagnosis not present

## 2021-04-27 DIAGNOSIS — F419 Anxiety disorder, unspecified: Secondary | ICD-10-CM | POA: Diagnosis not present

## 2021-04-27 DIAGNOSIS — R001 Bradycardia, unspecified: Secondary | ICD-10-CM | POA: Diagnosis not present

## 2021-04-27 DIAGNOSIS — Z87440 Personal history of urinary (tract) infections: Secondary | ICD-10-CM | POA: Diagnosis not present

## 2021-04-27 DIAGNOSIS — M8580 Other specified disorders of bone density and structure, unspecified site: Secondary | ICD-10-CM | POA: Diagnosis not present

## 2021-04-27 DIAGNOSIS — N189 Chronic kidney disease, unspecified: Secondary | ICD-10-CM | POA: Diagnosis not present

## 2021-04-27 DIAGNOSIS — L97812 Non-pressure chronic ulcer of other part of right lower leg with fat layer exposed: Secondary | ICD-10-CM | POA: Diagnosis not present

## 2021-04-27 DIAGNOSIS — F0390 Unspecified dementia without behavioral disturbance: Secondary | ICD-10-CM | POA: Diagnosis not present

## 2021-04-27 DIAGNOSIS — G4700 Insomnia, unspecified: Secondary | ICD-10-CM | POA: Diagnosis not present

## 2021-04-27 DIAGNOSIS — Z961 Presence of intraocular lens: Secondary | ICD-10-CM | POA: Diagnosis not present

## 2021-04-27 DIAGNOSIS — I129 Hypertensive chronic kidney disease with stage 1 through stage 4 chronic kidney disease, or unspecified chronic kidney disease: Secondary | ICD-10-CM | POA: Diagnosis not present

## 2021-04-27 DIAGNOSIS — L97822 Non-pressure chronic ulcer of other part of left lower leg with fat layer exposed: Secondary | ICD-10-CM | POA: Diagnosis not present

## 2021-04-27 DIAGNOSIS — I872 Venous insufficiency (chronic) (peripheral): Secondary | ICD-10-CM | POA: Diagnosis not present

## 2021-05-01 DIAGNOSIS — I129 Hypertensive chronic kidney disease with stage 1 through stage 4 chronic kidney disease, or unspecified chronic kidney disease: Secondary | ICD-10-CM | POA: Diagnosis not present

## 2021-05-01 DIAGNOSIS — N189 Chronic kidney disease, unspecified: Secondary | ICD-10-CM | POA: Diagnosis not present

## 2021-05-01 DIAGNOSIS — L97812 Non-pressure chronic ulcer of other part of right lower leg with fat layer exposed: Secondary | ICD-10-CM | POA: Diagnosis not present

## 2021-05-01 DIAGNOSIS — F0390 Unspecified dementia without behavioral disturbance: Secondary | ICD-10-CM | POA: Diagnosis not present

## 2021-05-01 DIAGNOSIS — L97822 Non-pressure chronic ulcer of other part of left lower leg with fat layer exposed: Secondary | ICD-10-CM | POA: Diagnosis not present

## 2021-05-01 DIAGNOSIS — I872 Venous insufficiency (chronic) (peripheral): Secondary | ICD-10-CM | POA: Diagnosis not present

## 2021-05-04 DIAGNOSIS — I872 Venous insufficiency (chronic) (peripheral): Secondary | ICD-10-CM | POA: Diagnosis not present

## 2021-05-04 DIAGNOSIS — I83891 Varicose veins of right lower extremities with other complications: Secondary | ICD-10-CM | POA: Diagnosis not present

## 2021-05-04 DIAGNOSIS — L97222 Non-pressure chronic ulcer of left calf with fat layer exposed: Secondary | ICD-10-CM | POA: Diagnosis not present

## 2021-05-04 DIAGNOSIS — I83019 Varicose veins of right lower extremity with ulcer of unspecified site: Secondary | ICD-10-CM | POA: Diagnosis not present

## 2021-05-04 DIAGNOSIS — L97212 Non-pressure chronic ulcer of right calf with fat layer exposed: Secondary | ICD-10-CM | POA: Diagnosis not present

## 2021-05-04 DIAGNOSIS — L97919 Non-pressure chronic ulcer of unspecified part of right lower leg with unspecified severity: Secondary | ICD-10-CM | POA: Diagnosis not present

## 2021-05-04 DIAGNOSIS — R609 Edema, unspecified: Secondary | ICD-10-CM | POA: Diagnosis not present

## 2021-05-08 DIAGNOSIS — I872 Venous insufficiency (chronic) (peripheral): Secondary | ICD-10-CM | POA: Diagnosis not present

## 2021-05-08 DIAGNOSIS — F0390 Unspecified dementia without behavioral disturbance: Secondary | ICD-10-CM | POA: Diagnosis not present

## 2021-05-08 DIAGNOSIS — N189 Chronic kidney disease, unspecified: Secondary | ICD-10-CM | POA: Diagnosis not present

## 2021-05-08 DIAGNOSIS — I129 Hypertensive chronic kidney disease with stage 1 through stage 4 chronic kidney disease, or unspecified chronic kidney disease: Secondary | ICD-10-CM | POA: Diagnosis not present

## 2021-05-08 DIAGNOSIS — L97822 Non-pressure chronic ulcer of other part of left lower leg with fat layer exposed: Secondary | ICD-10-CM | POA: Diagnosis not present

## 2021-05-08 DIAGNOSIS — L97812 Non-pressure chronic ulcer of other part of right lower leg with fat layer exposed: Secondary | ICD-10-CM | POA: Diagnosis not present

## 2021-05-11 DIAGNOSIS — I872 Venous insufficiency (chronic) (peripheral): Secondary | ICD-10-CM | POA: Diagnosis not present

## 2021-05-11 DIAGNOSIS — F0390 Unspecified dementia without behavioral disturbance: Secondary | ICD-10-CM | POA: Diagnosis not present

## 2021-05-11 DIAGNOSIS — I129 Hypertensive chronic kidney disease with stage 1 through stage 4 chronic kidney disease, or unspecified chronic kidney disease: Secondary | ICD-10-CM | POA: Diagnosis not present

## 2021-05-11 DIAGNOSIS — L97822 Non-pressure chronic ulcer of other part of left lower leg with fat layer exposed: Secondary | ICD-10-CM | POA: Diagnosis not present

## 2021-05-11 DIAGNOSIS — N189 Chronic kidney disease, unspecified: Secondary | ICD-10-CM | POA: Diagnosis not present

## 2021-05-11 DIAGNOSIS — L97812 Non-pressure chronic ulcer of other part of right lower leg with fat layer exposed: Secondary | ICD-10-CM | POA: Diagnosis not present

## 2021-05-15 DIAGNOSIS — L97822 Non-pressure chronic ulcer of other part of left lower leg with fat layer exposed: Secondary | ICD-10-CM | POA: Diagnosis not present

## 2021-05-15 DIAGNOSIS — N189 Chronic kidney disease, unspecified: Secondary | ICD-10-CM | POA: Diagnosis not present

## 2021-05-15 DIAGNOSIS — I129 Hypertensive chronic kidney disease with stage 1 through stage 4 chronic kidney disease, or unspecified chronic kidney disease: Secondary | ICD-10-CM | POA: Diagnosis not present

## 2021-05-15 DIAGNOSIS — I872 Venous insufficiency (chronic) (peripheral): Secondary | ICD-10-CM | POA: Diagnosis not present

## 2021-05-15 DIAGNOSIS — L97812 Non-pressure chronic ulcer of other part of right lower leg with fat layer exposed: Secondary | ICD-10-CM | POA: Diagnosis not present

## 2021-05-15 DIAGNOSIS — F0390 Unspecified dementia without behavioral disturbance: Secondary | ICD-10-CM | POA: Diagnosis not present

## 2021-05-18 DIAGNOSIS — L97812 Non-pressure chronic ulcer of other part of right lower leg with fat layer exposed: Secondary | ICD-10-CM | POA: Diagnosis not present

## 2021-05-18 DIAGNOSIS — L97822 Non-pressure chronic ulcer of other part of left lower leg with fat layer exposed: Secondary | ICD-10-CM | POA: Diagnosis not present

## 2021-05-18 DIAGNOSIS — I129 Hypertensive chronic kidney disease with stage 1 through stage 4 chronic kidney disease, or unspecified chronic kidney disease: Secondary | ICD-10-CM | POA: Diagnosis not present

## 2021-05-18 DIAGNOSIS — N189 Chronic kidney disease, unspecified: Secondary | ICD-10-CM | POA: Diagnosis not present

## 2021-05-18 DIAGNOSIS — I872 Venous insufficiency (chronic) (peripheral): Secondary | ICD-10-CM | POA: Diagnosis not present

## 2021-05-18 DIAGNOSIS — F0390 Unspecified dementia without behavioral disturbance: Secondary | ICD-10-CM | POA: Diagnosis not present

## 2021-05-22 DIAGNOSIS — I129 Hypertensive chronic kidney disease with stage 1 through stage 4 chronic kidney disease, or unspecified chronic kidney disease: Secondary | ICD-10-CM | POA: Diagnosis not present

## 2021-05-22 DIAGNOSIS — L97822 Non-pressure chronic ulcer of other part of left lower leg with fat layer exposed: Secondary | ICD-10-CM | POA: Diagnosis not present

## 2021-05-22 DIAGNOSIS — F0390 Unspecified dementia without behavioral disturbance: Secondary | ICD-10-CM | POA: Diagnosis not present

## 2021-05-22 DIAGNOSIS — I872 Venous insufficiency (chronic) (peripheral): Secondary | ICD-10-CM | POA: Diagnosis not present

## 2021-05-22 DIAGNOSIS — L97812 Non-pressure chronic ulcer of other part of right lower leg with fat layer exposed: Secondary | ICD-10-CM | POA: Diagnosis not present

## 2021-05-22 DIAGNOSIS — N189 Chronic kidney disease, unspecified: Secondary | ICD-10-CM | POA: Diagnosis not present

## 2021-05-25 DIAGNOSIS — F0390 Unspecified dementia without behavioral disturbance: Secondary | ICD-10-CM | POA: Diagnosis not present

## 2021-05-25 DIAGNOSIS — L97812 Non-pressure chronic ulcer of other part of right lower leg with fat layer exposed: Secondary | ICD-10-CM | POA: Diagnosis not present

## 2021-05-25 DIAGNOSIS — L97822 Non-pressure chronic ulcer of other part of left lower leg with fat layer exposed: Secondary | ICD-10-CM | POA: Diagnosis not present

## 2021-05-25 DIAGNOSIS — I129 Hypertensive chronic kidney disease with stage 1 through stage 4 chronic kidney disease, or unspecified chronic kidney disease: Secondary | ICD-10-CM | POA: Diagnosis not present

## 2021-05-25 DIAGNOSIS — I872 Venous insufficiency (chronic) (peripheral): Secondary | ICD-10-CM | POA: Diagnosis not present

## 2021-05-25 DIAGNOSIS — N189 Chronic kidney disease, unspecified: Secondary | ICD-10-CM | POA: Diagnosis not present

## 2021-05-27 DIAGNOSIS — Z87440 Personal history of urinary (tract) infections: Secondary | ICD-10-CM | POA: Diagnosis not present

## 2021-05-27 DIAGNOSIS — M8580 Other specified disorders of bone density and structure, unspecified site: Secondary | ICD-10-CM | POA: Diagnosis not present

## 2021-05-27 DIAGNOSIS — G629 Polyneuropathy, unspecified: Secondary | ICD-10-CM | POA: Diagnosis not present

## 2021-05-27 DIAGNOSIS — F419 Anxiety disorder, unspecified: Secondary | ICD-10-CM | POA: Diagnosis not present

## 2021-05-27 DIAGNOSIS — N189 Chronic kidney disease, unspecified: Secondary | ICD-10-CM | POA: Diagnosis not present

## 2021-05-27 DIAGNOSIS — Z96653 Presence of artificial knee joint, bilateral: Secondary | ICD-10-CM | POA: Diagnosis not present

## 2021-05-27 DIAGNOSIS — Z961 Presence of intraocular lens: Secondary | ICD-10-CM | POA: Diagnosis not present

## 2021-05-27 DIAGNOSIS — I129 Hypertensive chronic kidney disease with stage 1 through stage 4 chronic kidney disease, or unspecified chronic kidney disease: Secondary | ICD-10-CM | POA: Diagnosis not present

## 2021-05-27 DIAGNOSIS — J309 Allergic rhinitis, unspecified: Secondary | ICD-10-CM | POA: Diagnosis not present

## 2021-05-27 DIAGNOSIS — R001 Bradycardia, unspecified: Secondary | ICD-10-CM | POA: Diagnosis not present

## 2021-05-27 DIAGNOSIS — L97822 Non-pressure chronic ulcer of other part of left lower leg with fat layer exposed: Secondary | ICD-10-CM | POA: Diagnosis not present

## 2021-05-27 DIAGNOSIS — K219 Gastro-esophageal reflux disease without esophagitis: Secondary | ICD-10-CM | POA: Diagnosis not present

## 2021-05-27 DIAGNOSIS — G4700 Insomnia, unspecified: Secondary | ICD-10-CM | POA: Diagnosis not present

## 2021-05-27 DIAGNOSIS — M40204 Unspecified kyphosis, thoracic region: Secondary | ICD-10-CM | POA: Diagnosis not present

## 2021-05-27 DIAGNOSIS — F0390 Unspecified dementia without behavioral disturbance: Secondary | ICD-10-CM | POA: Diagnosis not present

## 2021-05-27 DIAGNOSIS — L97812 Non-pressure chronic ulcer of other part of right lower leg with fat layer exposed: Secondary | ICD-10-CM | POA: Diagnosis not present

## 2021-05-27 DIAGNOSIS — I872 Venous insufficiency (chronic) (peripheral): Secondary | ICD-10-CM | POA: Diagnosis not present

## 2021-05-30 DIAGNOSIS — N189 Chronic kidney disease, unspecified: Secondary | ICD-10-CM | POA: Diagnosis not present

## 2021-05-30 DIAGNOSIS — I872 Venous insufficiency (chronic) (peripheral): Secondary | ICD-10-CM | POA: Diagnosis not present

## 2021-05-30 DIAGNOSIS — I129 Hypertensive chronic kidney disease with stage 1 through stage 4 chronic kidney disease, or unspecified chronic kidney disease: Secondary | ICD-10-CM | POA: Diagnosis not present

## 2021-05-30 DIAGNOSIS — F0390 Unspecified dementia without behavioral disturbance: Secondary | ICD-10-CM | POA: Diagnosis not present

## 2021-05-30 DIAGNOSIS — L97812 Non-pressure chronic ulcer of other part of right lower leg with fat layer exposed: Secondary | ICD-10-CM | POA: Diagnosis not present

## 2021-05-30 DIAGNOSIS — L97822 Non-pressure chronic ulcer of other part of left lower leg with fat layer exposed: Secondary | ICD-10-CM | POA: Diagnosis not present

## 2021-06-01 DIAGNOSIS — L97812 Non-pressure chronic ulcer of other part of right lower leg with fat layer exposed: Secondary | ICD-10-CM | POA: Diagnosis not present

## 2021-06-01 DIAGNOSIS — R609 Edema, unspecified: Secondary | ICD-10-CM | POA: Diagnosis not present

## 2021-06-01 DIAGNOSIS — L97929 Non-pressure chronic ulcer of unspecified part of left lower leg with unspecified severity: Secondary | ICD-10-CM | POA: Diagnosis not present

## 2021-06-01 DIAGNOSIS — I83019 Varicose veins of right lower extremity with ulcer of unspecified site: Secondary | ICD-10-CM | POA: Diagnosis not present

## 2021-06-01 DIAGNOSIS — I83018 Varicose veins of right lower extremity with ulcer other part of lower leg: Secondary | ICD-10-CM | POA: Diagnosis not present

## 2021-06-01 DIAGNOSIS — L97212 Non-pressure chronic ulcer of right calf with fat layer exposed: Secondary | ICD-10-CM | POA: Diagnosis not present

## 2021-06-01 DIAGNOSIS — I83023 Varicose veins of left lower extremity with ulcer of ankle: Secondary | ICD-10-CM | POA: Diagnosis not present

## 2021-06-01 DIAGNOSIS — L97322 Non-pressure chronic ulcer of left ankle with fat layer exposed: Secondary | ICD-10-CM | POA: Diagnosis not present

## 2021-06-01 DIAGNOSIS — I83893 Varicose veins of bilateral lower extremities with other complications: Secondary | ICD-10-CM | POA: Diagnosis not present

## 2021-06-01 DIAGNOSIS — I83029 Varicose veins of left lower extremity with ulcer of unspecified site: Secondary | ICD-10-CM | POA: Diagnosis not present

## 2021-06-05 DIAGNOSIS — I129 Hypertensive chronic kidney disease with stage 1 through stage 4 chronic kidney disease, or unspecified chronic kidney disease: Secondary | ICD-10-CM | POA: Diagnosis not present

## 2021-06-05 DIAGNOSIS — L97812 Non-pressure chronic ulcer of other part of right lower leg with fat layer exposed: Secondary | ICD-10-CM | POA: Diagnosis not present

## 2021-06-05 DIAGNOSIS — I872 Venous insufficiency (chronic) (peripheral): Secondary | ICD-10-CM | POA: Diagnosis not present

## 2021-06-05 DIAGNOSIS — F0390 Unspecified dementia without behavioral disturbance: Secondary | ICD-10-CM | POA: Diagnosis not present

## 2021-06-05 DIAGNOSIS — L97822 Non-pressure chronic ulcer of other part of left lower leg with fat layer exposed: Secondary | ICD-10-CM | POA: Diagnosis not present

## 2021-06-05 DIAGNOSIS — N189 Chronic kidney disease, unspecified: Secondary | ICD-10-CM | POA: Diagnosis not present

## 2021-06-08 DIAGNOSIS — N189 Chronic kidney disease, unspecified: Secondary | ICD-10-CM | POA: Diagnosis not present

## 2021-06-08 DIAGNOSIS — L97812 Non-pressure chronic ulcer of other part of right lower leg with fat layer exposed: Secondary | ICD-10-CM | POA: Diagnosis not present

## 2021-06-08 DIAGNOSIS — I129 Hypertensive chronic kidney disease with stage 1 through stage 4 chronic kidney disease, or unspecified chronic kidney disease: Secondary | ICD-10-CM | POA: Diagnosis not present

## 2021-06-08 DIAGNOSIS — F0390 Unspecified dementia without behavioral disturbance: Secondary | ICD-10-CM | POA: Diagnosis not present

## 2021-06-08 DIAGNOSIS — L97822 Non-pressure chronic ulcer of other part of left lower leg with fat layer exposed: Secondary | ICD-10-CM | POA: Diagnosis not present

## 2021-06-08 DIAGNOSIS — I872 Venous insufficiency (chronic) (peripheral): Secondary | ICD-10-CM | POA: Diagnosis not present

## 2021-06-12 DIAGNOSIS — L97822 Non-pressure chronic ulcer of other part of left lower leg with fat layer exposed: Secondary | ICD-10-CM | POA: Diagnosis not present

## 2021-06-12 DIAGNOSIS — I129 Hypertensive chronic kidney disease with stage 1 through stage 4 chronic kidney disease, or unspecified chronic kidney disease: Secondary | ICD-10-CM | POA: Diagnosis not present

## 2021-06-12 DIAGNOSIS — L97812 Non-pressure chronic ulcer of other part of right lower leg with fat layer exposed: Secondary | ICD-10-CM | POA: Diagnosis not present

## 2021-06-12 DIAGNOSIS — I872 Venous insufficiency (chronic) (peripheral): Secondary | ICD-10-CM | POA: Diagnosis not present

## 2021-06-12 DIAGNOSIS — N189 Chronic kidney disease, unspecified: Secondary | ICD-10-CM | POA: Diagnosis not present

## 2021-06-12 DIAGNOSIS — F0390 Unspecified dementia without behavioral disturbance: Secondary | ICD-10-CM | POA: Diagnosis not present

## 2021-06-15 DIAGNOSIS — F0390 Unspecified dementia without behavioral disturbance: Secondary | ICD-10-CM | POA: Diagnosis not present

## 2021-06-15 DIAGNOSIS — L97812 Non-pressure chronic ulcer of other part of right lower leg with fat layer exposed: Secondary | ICD-10-CM | POA: Diagnosis not present

## 2021-06-15 DIAGNOSIS — N189 Chronic kidney disease, unspecified: Secondary | ICD-10-CM | POA: Diagnosis not present

## 2021-06-15 DIAGNOSIS — L97822 Non-pressure chronic ulcer of other part of left lower leg with fat layer exposed: Secondary | ICD-10-CM | POA: Diagnosis not present

## 2021-06-15 DIAGNOSIS — I129 Hypertensive chronic kidney disease with stage 1 through stage 4 chronic kidney disease, or unspecified chronic kidney disease: Secondary | ICD-10-CM | POA: Diagnosis not present

## 2021-06-15 DIAGNOSIS — I872 Venous insufficiency (chronic) (peripheral): Secondary | ICD-10-CM | POA: Diagnosis not present

## 2021-06-19 DIAGNOSIS — I872 Venous insufficiency (chronic) (peripheral): Secondary | ICD-10-CM | POA: Diagnosis not present

## 2021-06-19 DIAGNOSIS — L97812 Non-pressure chronic ulcer of other part of right lower leg with fat layer exposed: Secondary | ICD-10-CM | POA: Diagnosis not present

## 2021-06-19 DIAGNOSIS — L97822 Non-pressure chronic ulcer of other part of left lower leg with fat layer exposed: Secondary | ICD-10-CM | POA: Diagnosis not present

## 2021-06-19 DIAGNOSIS — F0390 Unspecified dementia without behavioral disturbance: Secondary | ICD-10-CM | POA: Diagnosis not present

## 2021-06-19 DIAGNOSIS — I129 Hypertensive chronic kidney disease with stage 1 through stage 4 chronic kidney disease, or unspecified chronic kidney disease: Secondary | ICD-10-CM | POA: Diagnosis not present

## 2021-06-19 DIAGNOSIS — N189 Chronic kidney disease, unspecified: Secondary | ICD-10-CM | POA: Diagnosis not present

## 2021-06-22 DIAGNOSIS — I872 Venous insufficiency (chronic) (peripheral): Secondary | ICD-10-CM | POA: Diagnosis not present

## 2021-06-22 DIAGNOSIS — L97812 Non-pressure chronic ulcer of other part of right lower leg with fat layer exposed: Secondary | ICD-10-CM | POA: Diagnosis not present

## 2021-06-22 DIAGNOSIS — N189 Chronic kidney disease, unspecified: Secondary | ICD-10-CM | POA: Diagnosis not present

## 2021-06-22 DIAGNOSIS — F0390 Unspecified dementia without behavioral disturbance: Secondary | ICD-10-CM | POA: Diagnosis not present

## 2021-06-22 DIAGNOSIS — I129 Hypertensive chronic kidney disease with stage 1 through stage 4 chronic kidney disease, or unspecified chronic kidney disease: Secondary | ICD-10-CM | POA: Diagnosis not present

## 2021-06-22 DIAGNOSIS — L97822 Non-pressure chronic ulcer of other part of left lower leg with fat layer exposed: Secondary | ICD-10-CM | POA: Diagnosis not present

## 2021-06-26 DIAGNOSIS — I129 Hypertensive chronic kidney disease with stage 1 through stage 4 chronic kidney disease, or unspecified chronic kidney disease: Secondary | ICD-10-CM | POA: Diagnosis not present

## 2021-06-26 DIAGNOSIS — N189 Chronic kidney disease, unspecified: Secondary | ICD-10-CM | POA: Diagnosis not present

## 2021-06-26 DIAGNOSIS — L97822 Non-pressure chronic ulcer of other part of left lower leg with fat layer exposed: Secondary | ICD-10-CM | POA: Diagnosis not present

## 2021-06-26 DIAGNOSIS — G4700 Insomnia, unspecified: Secondary | ICD-10-CM | POA: Diagnosis not present

## 2021-06-26 DIAGNOSIS — Z87440 Personal history of urinary (tract) infections: Secondary | ICD-10-CM | POA: Diagnosis not present

## 2021-06-26 DIAGNOSIS — Z961 Presence of intraocular lens: Secondary | ICD-10-CM | POA: Diagnosis not present

## 2021-06-26 DIAGNOSIS — J309 Allergic rhinitis, unspecified: Secondary | ICD-10-CM | POA: Diagnosis not present

## 2021-06-26 DIAGNOSIS — M8580 Other specified disorders of bone density and structure, unspecified site: Secondary | ICD-10-CM | POA: Diagnosis not present

## 2021-06-26 DIAGNOSIS — F419 Anxiety disorder, unspecified: Secondary | ICD-10-CM | POA: Diagnosis not present

## 2021-06-26 DIAGNOSIS — M40204 Unspecified kyphosis, thoracic region: Secondary | ICD-10-CM | POA: Diagnosis not present

## 2021-06-26 DIAGNOSIS — L97812 Non-pressure chronic ulcer of other part of right lower leg with fat layer exposed: Secondary | ICD-10-CM | POA: Diagnosis not present

## 2021-06-26 DIAGNOSIS — K219 Gastro-esophageal reflux disease without esophagitis: Secondary | ICD-10-CM | POA: Diagnosis not present

## 2021-06-26 DIAGNOSIS — R001 Bradycardia, unspecified: Secondary | ICD-10-CM | POA: Diagnosis not present

## 2021-06-26 DIAGNOSIS — F0390 Unspecified dementia without behavioral disturbance: Secondary | ICD-10-CM | POA: Diagnosis not present

## 2021-06-26 DIAGNOSIS — I872 Venous insufficiency (chronic) (peripheral): Secondary | ICD-10-CM | POA: Diagnosis not present

## 2021-06-26 DIAGNOSIS — G629 Polyneuropathy, unspecified: Secondary | ICD-10-CM | POA: Diagnosis not present

## 2021-06-26 DIAGNOSIS — Z96653 Presence of artificial knee joint, bilateral: Secondary | ICD-10-CM | POA: Diagnosis not present

## 2021-06-27 DIAGNOSIS — H401133 Primary open-angle glaucoma, bilateral, severe stage: Secondary | ICD-10-CM | POA: Diagnosis not present

## 2021-06-29 DIAGNOSIS — L97222 Non-pressure chronic ulcer of left calf with fat layer exposed: Secondary | ICD-10-CM | POA: Diagnosis not present

## 2021-06-29 DIAGNOSIS — L97212 Non-pressure chronic ulcer of right calf with fat layer exposed: Secondary | ICD-10-CM | POA: Diagnosis not present

## 2021-06-29 DIAGNOSIS — L97929 Non-pressure chronic ulcer of unspecified part of left lower leg with unspecified severity: Secondary | ICD-10-CM | POA: Diagnosis not present

## 2021-06-29 DIAGNOSIS — R609 Edema, unspecified: Secondary | ICD-10-CM | POA: Diagnosis not present

## 2021-06-29 DIAGNOSIS — I83029 Varicose veins of left lower extremity with ulcer of unspecified site: Secondary | ICD-10-CM | POA: Diagnosis not present

## 2021-06-29 DIAGNOSIS — I83891 Varicose veins of right lower extremities with other complications: Secondary | ICD-10-CM | POA: Diagnosis not present

## 2021-06-29 DIAGNOSIS — L97919 Non-pressure chronic ulcer of unspecified part of right lower leg with unspecified severity: Secondary | ICD-10-CM | POA: Diagnosis not present

## 2021-06-29 DIAGNOSIS — I83019 Varicose veins of right lower extremity with ulcer of unspecified site: Secondary | ICD-10-CM | POA: Diagnosis not present

## 2021-06-29 DIAGNOSIS — I872 Venous insufficiency (chronic) (peripheral): Secondary | ICD-10-CM | POA: Diagnosis not present

## 2021-06-29 DIAGNOSIS — I83892 Varicose veins of left lower extremities with other complications: Secondary | ICD-10-CM | POA: Diagnosis not present

## 2021-07-03 DIAGNOSIS — I872 Venous insufficiency (chronic) (peripheral): Secondary | ICD-10-CM | POA: Diagnosis not present

## 2021-07-03 DIAGNOSIS — I129 Hypertensive chronic kidney disease with stage 1 through stage 4 chronic kidney disease, or unspecified chronic kidney disease: Secondary | ICD-10-CM | POA: Diagnosis not present

## 2021-07-03 DIAGNOSIS — N189 Chronic kidney disease, unspecified: Secondary | ICD-10-CM | POA: Diagnosis not present

## 2021-07-03 DIAGNOSIS — L97822 Non-pressure chronic ulcer of other part of left lower leg with fat layer exposed: Secondary | ICD-10-CM | POA: Diagnosis not present

## 2021-07-03 DIAGNOSIS — F0390 Unspecified dementia without behavioral disturbance: Secondary | ICD-10-CM | POA: Diagnosis not present

## 2021-07-03 DIAGNOSIS — L97812 Non-pressure chronic ulcer of other part of right lower leg with fat layer exposed: Secondary | ICD-10-CM | POA: Diagnosis not present

## 2021-07-06 DIAGNOSIS — L97812 Non-pressure chronic ulcer of other part of right lower leg with fat layer exposed: Secondary | ICD-10-CM | POA: Diagnosis not present

## 2021-07-06 DIAGNOSIS — I872 Venous insufficiency (chronic) (peripheral): Secondary | ICD-10-CM | POA: Diagnosis not present

## 2021-07-06 DIAGNOSIS — L97822 Non-pressure chronic ulcer of other part of left lower leg with fat layer exposed: Secondary | ICD-10-CM | POA: Diagnosis not present

## 2021-07-06 DIAGNOSIS — I129 Hypertensive chronic kidney disease with stage 1 through stage 4 chronic kidney disease, or unspecified chronic kidney disease: Secondary | ICD-10-CM | POA: Diagnosis not present

## 2021-07-06 DIAGNOSIS — F0390 Unspecified dementia without behavioral disturbance: Secondary | ICD-10-CM | POA: Diagnosis not present

## 2021-07-06 DIAGNOSIS — N189 Chronic kidney disease, unspecified: Secondary | ICD-10-CM | POA: Diagnosis not present

## 2021-07-10 DIAGNOSIS — I872 Venous insufficiency (chronic) (peripheral): Secondary | ICD-10-CM | POA: Diagnosis not present

## 2021-07-10 DIAGNOSIS — N189 Chronic kidney disease, unspecified: Secondary | ICD-10-CM | POA: Diagnosis not present

## 2021-07-10 DIAGNOSIS — F0390 Unspecified dementia without behavioral disturbance: Secondary | ICD-10-CM | POA: Diagnosis not present

## 2021-07-10 DIAGNOSIS — L97822 Non-pressure chronic ulcer of other part of left lower leg with fat layer exposed: Secondary | ICD-10-CM | POA: Diagnosis not present

## 2021-07-10 DIAGNOSIS — I129 Hypertensive chronic kidney disease with stage 1 through stage 4 chronic kidney disease, or unspecified chronic kidney disease: Secondary | ICD-10-CM | POA: Diagnosis not present

## 2021-07-10 DIAGNOSIS — L97812 Non-pressure chronic ulcer of other part of right lower leg with fat layer exposed: Secondary | ICD-10-CM | POA: Diagnosis not present

## 2021-07-13 DIAGNOSIS — I83019 Varicose veins of right lower extremity with ulcer of unspecified site: Secondary | ICD-10-CM | POA: Diagnosis not present

## 2021-07-13 DIAGNOSIS — R609 Edema, unspecified: Secondary | ICD-10-CM | POA: Diagnosis not present

## 2021-07-13 DIAGNOSIS — I83029 Varicose veins of left lower extremity with ulcer of unspecified site: Secondary | ICD-10-CM | POA: Diagnosis not present

## 2021-07-13 DIAGNOSIS — I83018 Varicose veins of right lower extremity with ulcer other part of lower leg: Secondary | ICD-10-CM | POA: Diagnosis not present

## 2021-07-13 DIAGNOSIS — I83028 Varicose veins of left lower extremity with ulcer other part of lower leg: Secondary | ICD-10-CM | POA: Diagnosis not present

## 2021-07-13 DIAGNOSIS — L97929 Non-pressure chronic ulcer of unspecified part of left lower leg with unspecified severity: Secondary | ICD-10-CM | POA: Diagnosis not present

## 2021-07-13 DIAGNOSIS — L97822 Non-pressure chronic ulcer of other part of left lower leg with fat layer exposed: Secondary | ICD-10-CM | POA: Diagnosis not present

## 2021-07-13 DIAGNOSIS — L97212 Non-pressure chronic ulcer of right calf with fat layer exposed: Secondary | ICD-10-CM | POA: Diagnosis not present

## 2021-07-13 DIAGNOSIS — I872 Venous insufficiency (chronic) (peripheral): Secondary | ICD-10-CM | POA: Diagnosis not present

## 2021-07-13 DIAGNOSIS — L97919 Non-pressure chronic ulcer of unspecified part of right lower leg with unspecified severity: Secondary | ICD-10-CM | POA: Diagnosis not present

## 2021-07-13 DIAGNOSIS — L97222 Non-pressure chronic ulcer of left calf with fat layer exposed: Secondary | ICD-10-CM | POA: Diagnosis not present

## 2021-07-13 DIAGNOSIS — L97812 Non-pressure chronic ulcer of other part of right lower leg with fat layer exposed: Secondary | ICD-10-CM | POA: Diagnosis not present

## 2021-07-17 DIAGNOSIS — L97822 Non-pressure chronic ulcer of other part of left lower leg with fat layer exposed: Secondary | ICD-10-CM | POA: Diagnosis not present

## 2021-07-17 DIAGNOSIS — N189 Chronic kidney disease, unspecified: Secondary | ICD-10-CM | POA: Diagnosis not present

## 2021-07-17 DIAGNOSIS — F0390 Unspecified dementia without behavioral disturbance: Secondary | ICD-10-CM | POA: Diagnosis not present

## 2021-07-17 DIAGNOSIS — I129 Hypertensive chronic kidney disease with stage 1 through stage 4 chronic kidney disease, or unspecified chronic kidney disease: Secondary | ICD-10-CM | POA: Diagnosis not present

## 2021-07-17 DIAGNOSIS — I872 Venous insufficiency (chronic) (peripheral): Secondary | ICD-10-CM | POA: Diagnosis not present

## 2021-07-17 DIAGNOSIS — L97812 Non-pressure chronic ulcer of other part of right lower leg with fat layer exposed: Secondary | ICD-10-CM | POA: Diagnosis not present

## 2021-07-20 DIAGNOSIS — I83018 Varicose veins of right lower extremity with ulcer other part of lower leg: Secondary | ICD-10-CM | POA: Diagnosis not present

## 2021-07-20 DIAGNOSIS — I83013 Varicose veins of right lower extremity with ulcer of ankle: Secondary | ICD-10-CM | POA: Diagnosis not present

## 2021-07-20 DIAGNOSIS — I872 Venous insufficiency (chronic) (peripheral): Secondary | ICD-10-CM | POA: Diagnosis not present

## 2021-07-20 DIAGNOSIS — R609 Edema, unspecified: Secondary | ICD-10-CM | POA: Diagnosis not present

## 2021-07-20 DIAGNOSIS — L97212 Non-pressure chronic ulcer of right calf with fat layer exposed: Secondary | ICD-10-CM | POA: Diagnosis not present

## 2021-07-20 DIAGNOSIS — L97822 Non-pressure chronic ulcer of other part of left lower leg with fat layer exposed: Secondary | ICD-10-CM | POA: Diagnosis not present

## 2021-07-20 DIAGNOSIS — L97812 Non-pressure chronic ulcer of other part of right lower leg with fat layer exposed: Secondary | ICD-10-CM | POA: Diagnosis not present

## 2021-07-20 DIAGNOSIS — L97222 Non-pressure chronic ulcer of left calf with fat layer exposed: Secondary | ICD-10-CM | POA: Diagnosis not present

## 2021-07-20 DIAGNOSIS — I83891 Varicose veins of right lower extremities with other complications: Secondary | ICD-10-CM | POA: Diagnosis not present

## 2021-07-20 DIAGNOSIS — L97312 Non-pressure chronic ulcer of right ankle with fat layer exposed: Secondary | ICD-10-CM | POA: Diagnosis not present

## 2021-07-20 DIAGNOSIS — I83028 Varicose veins of left lower extremity with ulcer other part of lower leg: Secondary | ICD-10-CM | POA: Diagnosis not present

## 2021-07-20 DIAGNOSIS — I83892 Varicose veins of left lower extremities with other complications: Secondary | ICD-10-CM | POA: Diagnosis not present

## 2021-07-27 DIAGNOSIS — R600 Localized edema: Secondary | ICD-10-CM | POA: Diagnosis not present

## 2021-07-27 DIAGNOSIS — I83891 Varicose veins of right lower extremities with other complications: Secondary | ICD-10-CM | POA: Diagnosis not present

## 2021-07-27 DIAGNOSIS — I83023 Varicose veins of left lower extremity with ulcer of ankle: Secondary | ICD-10-CM | POA: Diagnosis not present

## 2021-07-27 DIAGNOSIS — L97919 Non-pressure chronic ulcer of unspecified part of right lower leg with unspecified severity: Secondary | ICD-10-CM | POA: Diagnosis not present

## 2021-07-27 DIAGNOSIS — I83893 Varicose veins of bilateral lower extremities with other complications: Secondary | ICD-10-CM | POA: Diagnosis not present

## 2021-07-27 DIAGNOSIS — L97322 Non-pressure chronic ulcer of left ankle with fat layer exposed: Secondary | ICD-10-CM | POA: Diagnosis not present

## 2021-07-27 DIAGNOSIS — L97812 Non-pressure chronic ulcer of other part of right lower leg with fat layer exposed: Secondary | ICD-10-CM | POA: Diagnosis not present

## 2021-07-27 DIAGNOSIS — I83018 Varicose veins of right lower extremity with ulcer other part of lower leg: Secondary | ICD-10-CM | POA: Diagnosis not present

## 2021-07-27 DIAGNOSIS — L97929 Non-pressure chronic ulcer of unspecified part of left lower leg with unspecified severity: Secondary | ICD-10-CM | POA: Diagnosis not present

## 2021-07-27 DIAGNOSIS — R609 Edema, unspecified: Secondary | ICD-10-CM | POA: Diagnosis not present

## 2021-07-27 DIAGNOSIS — I83029 Varicose veins of left lower extremity with ulcer of unspecified site: Secondary | ICD-10-CM | POA: Diagnosis not present

## 2021-07-27 DIAGNOSIS — I83019 Varicose veins of right lower extremity with ulcer of unspecified site: Secondary | ICD-10-CM | POA: Diagnosis not present

## 2021-07-31 ENCOUNTER — Other Ambulatory Visit: Payer: Self-pay | Admitting: Cardiovascular Disease

## 2021-08-03 DIAGNOSIS — I83029 Varicose veins of left lower extremity with ulcer of unspecified site: Secondary | ICD-10-CM | POA: Diagnosis not present

## 2021-08-03 DIAGNOSIS — L97919 Non-pressure chronic ulcer of unspecified part of right lower leg with unspecified severity: Secondary | ICD-10-CM | POA: Diagnosis not present

## 2021-08-03 DIAGNOSIS — R609 Edema, unspecified: Secondary | ICD-10-CM | POA: Diagnosis not present

## 2021-08-03 DIAGNOSIS — I83891 Varicose veins of right lower extremities with other complications: Secondary | ICD-10-CM | POA: Diagnosis not present

## 2021-08-03 DIAGNOSIS — L97222 Non-pressure chronic ulcer of left calf with fat layer exposed: Secondary | ICD-10-CM | POA: Diagnosis not present

## 2021-08-03 DIAGNOSIS — L97929 Non-pressure chronic ulcer of unspecified part of left lower leg with unspecified severity: Secondary | ICD-10-CM | POA: Diagnosis not present

## 2021-08-03 DIAGNOSIS — I83892 Varicose veins of left lower extremities with other complications: Secondary | ICD-10-CM | POA: Diagnosis not present

## 2021-08-03 DIAGNOSIS — I83019 Varicose veins of right lower extremity with ulcer of unspecified site: Secondary | ICD-10-CM | POA: Diagnosis not present

## 2021-08-03 DIAGNOSIS — L97212 Non-pressure chronic ulcer of right calf with fat layer exposed: Secondary | ICD-10-CM | POA: Diagnosis not present

## 2021-08-03 DIAGNOSIS — I872 Venous insufficiency (chronic) (peripheral): Secondary | ICD-10-CM | POA: Diagnosis not present

## 2021-08-10 DIAGNOSIS — I83893 Varicose veins of bilateral lower extremities with other complications: Secondary | ICD-10-CM | POA: Diagnosis not present

## 2021-08-10 DIAGNOSIS — L97222 Non-pressure chronic ulcer of left calf with fat layer exposed: Secondary | ICD-10-CM | POA: Diagnosis not present

## 2021-08-10 DIAGNOSIS — I83012 Varicose veins of right lower extremity with ulcer of calf: Secondary | ICD-10-CM | POA: Diagnosis not present

## 2021-08-10 DIAGNOSIS — L97212 Non-pressure chronic ulcer of right calf with fat layer exposed: Secondary | ICD-10-CM | POA: Diagnosis not present

## 2021-08-10 DIAGNOSIS — I83028 Varicose veins of left lower extremity with ulcer other part of lower leg: Secondary | ICD-10-CM | POA: Diagnosis not present

## 2021-08-10 DIAGNOSIS — L97812 Non-pressure chronic ulcer of other part of right lower leg with fat layer exposed: Secondary | ICD-10-CM | POA: Diagnosis not present

## 2021-08-10 DIAGNOSIS — I83018 Varicose veins of right lower extremity with ulcer other part of lower leg: Secondary | ICD-10-CM | POA: Diagnosis not present

## 2021-08-10 DIAGNOSIS — L97822 Non-pressure chronic ulcer of other part of left lower leg with fat layer exposed: Secondary | ICD-10-CM | POA: Diagnosis not present

## 2021-08-10 DIAGNOSIS — I83022 Varicose veins of left lower extremity with ulcer of calf: Secondary | ICD-10-CM | POA: Diagnosis not present

## 2021-08-10 DIAGNOSIS — R609 Edema, unspecified: Secondary | ICD-10-CM | POA: Diagnosis not present

## 2021-08-17 DIAGNOSIS — L97919 Non-pressure chronic ulcer of unspecified part of right lower leg with unspecified severity: Secondary | ICD-10-CM | POA: Diagnosis not present

## 2021-08-17 DIAGNOSIS — I83019 Varicose veins of right lower extremity with ulcer of unspecified site: Secondary | ICD-10-CM | POA: Diagnosis not present

## 2021-08-17 DIAGNOSIS — R609 Edema, unspecified: Secondary | ICD-10-CM | POA: Diagnosis not present

## 2021-08-17 DIAGNOSIS — I83892 Varicose veins of left lower extremities with other complications: Secondary | ICD-10-CM | POA: Diagnosis not present

## 2021-08-17 DIAGNOSIS — L97222 Non-pressure chronic ulcer of left calf with fat layer exposed: Secondary | ICD-10-CM | POA: Diagnosis not present

## 2021-08-17 DIAGNOSIS — I83891 Varicose veins of right lower extremities with other complications: Secondary | ICD-10-CM | POA: Diagnosis not present

## 2021-08-17 DIAGNOSIS — I83029 Varicose veins of left lower extremity with ulcer of unspecified site: Secondary | ICD-10-CM | POA: Diagnosis not present

## 2021-08-17 DIAGNOSIS — L97212 Non-pressure chronic ulcer of right calf with fat layer exposed: Secondary | ICD-10-CM | POA: Diagnosis not present

## 2021-08-17 DIAGNOSIS — L97929 Non-pressure chronic ulcer of unspecified part of left lower leg with unspecified severity: Secondary | ICD-10-CM | POA: Diagnosis not present

## 2021-08-17 DIAGNOSIS — I872 Venous insufficiency (chronic) (peripheral): Secondary | ICD-10-CM | POA: Diagnosis not present

## 2021-08-24 DIAGNOSIS — I83028 Varicose veins of left lower extremity with ulcer other part of lower leg: Secondary | ICD-10-CM | POA: Diagnosis not present

## 2021-08-24 DIAGNOSIS — I83018 Varicose veins of right lower extremity with ulcer other part of lower leg: Secondary | ICD-10-CM | POA: Diagnosis not present

## 2021-08-24 DIAGNOSIS — I83893 Varicose veins of bilateral lower extremities with other complications: Secondary | ICD-10-CM | POA: Diagnosis not present

## 2021-08-24 DIAGNOSIS — I890 Lymphedema, not elsewhere classified: Secondary | ICD-10-CM | POA: Diagnosis not present

## 2021-08-24 DIAGNOSIS — L97812 Non-pressure chronic ulcer of other part of right lower leg with fat layer exposed: Secondary | ICD-10-CM | POA: Diagnosis not present

## 2021-08-24 DIAGNOSIS — L97822 Non-pressure chronic ulcer of other part of left lower leg with fat layer exposed: Secondary | ICD-10-CM | POA: Diagnosis not present

## 2021-08-28 DIAGNOSIS — L97919 Non-pressure chronic ulcer of unspecified part of right lower leg with unspecified severity: Secondary | ICD-10-CM | POA: Diagnosis not present

## 2021-08-28 DIAGNOSIS — I83019 Varicose veins of right lower extremity with ulcer of unspecified site: Secondary | ICD-10-CM | POA: Diagnosis not present

## 2021-08-28 DIAGNOSIS — L97222 Non-pressure chronic ulcer of left calf with fat layer exposed: Secondary | ICD-10-CM | POA: Diagnosis not present

## 2021-08-28 DIAGNOSIS — R609 Edema, unspecified: Secondary | ICD-10-CM | POA: Diagnosis not present

## 2021-08-28 DIAGNOSIS — I872 Venous insufficiency (chronic) (peripheral): Secondary | ICD-10-CM | POA: Diagnosis not present

## 2021-08-28 DIAGNOSIS — I83891 Varicose veins of right lower extremities with other complications: Secondary | ICD-10-CM | POA: Diagnosis not present

## 2021-08-28 DIAGNOSIS — L97212 Non-pressure chronic ulcer of right calf with fat layer exposed: Secondary | ICD-10-CM | POA: Diagnosis not present

## 2021-08-31 DIAGNOSIS — L97822 Non-pressure chronic ulcer of other part of left lower leg with fat layer exposed: Secondary | ICD-10-CM | POA: Diagnosis not present

## 2021-08-31 DIAGNOSIS — I83892 Varicose veins of left lower extremities with other complications: Secondary | ICD-10-CM | POA: Diagnosis not present

## 2021-08-31 DIAGNOSIS — L97919 Non-pressure chronic ulcer of unspecified part of right lower leg with unspecified severity: Secondary | ICD-10-CM | POA: Diagnosis not present

## 2021-08-31 DIAGNOSIS — I83028 Varicose veins of left lower extremity with ulcer other part of lower leg: Secondary | ICD-10-CM | POA: Diagnosis not present

## 2021-08-31 DIAGNOSIS — R609 Edema, unspecified: Secondary | ICD-10-CM | POA: Diagnosis not present

## 2021-08-31 DIAGNOSIS — I83893 Varicose veins of bilateral lower extremities with other complications: Secondary | ICD-10-CM | POA: Diagnosis not present

## 2021-08-31 DIAGNOSIS — I83018 Varicose veins of right lower extremity with ulcer other part of lower leg: Secondary | ICD-10-CM | POA: Diagnosis not present

## 2021-08-31 DIAGNOSIS — I83029 Varicose veins of left lower extremity with ulcer of unspecified site: Secondary | ICD-10-CM | POA: Diagnosis not present

## 2021-08-31 DIAGNOSIS — L97812 Non-pressure chronic ulcer of other part of right lower leg with fat layer exposed: Secondary | ICD-10-CM | POA: Diagnosis not present

## 2021-08-31 DIAGNOSIS — L97929 Non-pressure chronic ulcer of unspecified part of left lower leg with unspecified severity: Secondary | ICD-10-CM | POA: Diagnosis not present

## 2021-09-06 DIAGNOSIS — R531 Weakness: Secondary | ICD-10-CM | POA: Diagnosis not present

## 2021-09-06 DIAGNOSIS — R197 Diarrhea, unspecified: Secondary | ICD-10-CM | POA: Diagnosis not present

## 2021-09-06 DIAGNOSIS — R001 Bradycardia, unspecified: Secondary | ICD-10-CM | POA: Diagnosis not present

## 2021-09-06 DIAGNOSIS — R5383 Other fatigue: Secondary | ICD-10-CM | POA: Diagnosis not present

## 2021-09-14 DIAGNOSIS — L97212 Non-pressure chronic ulcer of right calf with fat layer exposed: Secondary | ICD-10-CM | POA: Diagnosis not present

## 2021-09-14 DIAGNOSIS — L97919 Non-pressure chronic ulcer of unspecified part of right lower leg with unspecified severity: Secondary | ICD-10-CM | POA: Diagnosis not present

## 2021-09-14 DIAGNOSIS — R609 Edema, unspecified: Secondary | ICD-10-CM | POA: Diagnosis not present

## 2021-09-14 DIAGNOSIS — I83891 Varicose veins of right lower extremities with other complications: Secondary | ICD-10-CM | POA: Diagnosis not present

## 2021-09-14 DIAGNOSIS — L97222 Non-pressure chronic ulcer of left calf with fat layer exposed: Secondary | ICD-10-CM | POA: Diagnosis not present

## 2021-09-14 DIAGNOSIS — I872 Venous insufficiency (chronic) (peripheral): Secondary | ICD-10-CM | POA: Diagnosis not present

## 2021-09-14 DIAGNOSIS — I83892 Varicose veins of left lower extremities with other complications: Secondary | ICD-10-CM | POA: Diagnosis not present

## 2021-09-14 DIAGNOSIS — I83029 Varicose veins of left lower extremity with ulcer of unspecified site: Secondary | ICD-10-CM | POA: Diagnosis not present

## 2021-09-14 DIAGNOSIS — I83019 Varicose veins of right lower extremity with ulcer of unspecified site: Secondary | ICD-10-CM | POA: Diagnosis not present

## 2021-09-14 DIAGNOSIS — L97929 Non-pressure chronic ulcer of unspecified part of left lower leg with unspecified severity: Secondary | ICD-10-CM | POA: Diagnosis not present

## 2021-09-21 DIAGNOSIS — I83893 Varicose veins of bilateral lower extremities with other complications: Secondary | ICD-10-CM | POA: Diagnosis not present

## 2021-09-21 DIAGNOSIS — L97919 Non-pressure chronic ulcer of unspecified part of right lower leg with unspecified severity: Secondary | ICD-10-CM | POA: Diagnosis not present

## 2021-09-21 DIAGNOSIS — L97822 Non-pressure chronic ulcer of other part of left lower leg with fat layer exposed: Secondary | ICD-10-CM | POA: Diagnosis not present

## 2021-09-21 DIAGNOSIS — L97222 Non-pressure chronic ulcer of left calf with fat layer exposed: Secondary | ICD-10-CM | POA: Diagnosis not present

## 2021-09-21 DIAGNOSIS — L97812 Non-pressure chronic ulcer of other part of right lower leg with fat layer exposed: Secondary | ICD-10-CM | POA: Diagnosis not present

## 2021-09-21 DIAGNOSIS — L97212 Non-pressure chronic ulcer of right calf with fat layer exposed: Secondary | ICD-10-CM | POA: Diagnosis not present

## 2021-09-21 DIAGNOSIS — L97929 Non-pressure chronic ulcer of unspecified part of left lower leg with unspecified severity: Secondary | ICD-10-CM | POA: Diagnosis not present

## 2021-09-21 DIAGNOSIS — I872 Venous insufficiency (chronic) (peripheral): Secondary | ICD-10-CM | POA: Diagnosis not present

## 2021-09-21 DIAGNOSIS — R609 Edema, unspecified: Secondary | ICD-10-CM | POA: Diagnosis not present

## 2021-09-21 DIAGNOSIS — I83018 Varicose veins of right lower extremity with ulcer other part of lower leg: Secondary | ICD-10-CM | POA: Diagnosis not present

## 2021-09-21 DIAGNOSIS — I83028 Varicose veins of left lower extremity with ulcer other part of lower leg: Secondary | ICD-10-CM | POA: Diagnosis not present

## 2021-09-28 DIAGNOSIS — I83019 Varicose veins of right lower extremity with ulcer of unspecified site: Secondary | ICD-10-CM | POA: Diagnosis not present

## 2021-09-28 DIAGNOSIS — I83029 Varicose veins of left lower extremity with ulcer of unspecified site: Secondary | ICD-10-CM | POA: Diagnosis not present

## 2021-09-28 DIAGNOSIS — L97212 Non-pressure chronic ulcer of right calf with fat layer exposed: Secondary | ICD-10-CM | POA: Diagnosis not present

## 2021-09-28 DIAGNOSIS — L97929 Non-pressure chronic ulcer of unspecified part of left lower leg with unspecified severity: Secondary | ICD-10-CM | POA: Diagnosis not present

## 2021-09-28 DIAGNOSIS — L97222 Non-pressure chronic ulcer of left calf with fat layer exposed: Secondary | ICD-10-CM | POA: Diagnosis not present

## 2021-09-28 DIAGNOSIS — R609 Edema, unspecified: Secondary | ICD-10-CM | POA: Diagnosis not present

## 2021-09-28 DIAGNOSIS — I83892 Varicose veins of left lower extremities with other complications: Secondary | ICD-10-CM | POA: Diagnosis not present

## 2021-09-28 DIAGNOSIS — L97919 Non-pressure chronic ulcer of unspecified part of right lower leg with unspecified severity: Secondary | ICD-10-CM | POA: Diagnosis not present

## 2021-09-28 DIAGNOSIS — I83891 Varicose veins of right lower extremities with other complications: Secondary | ICD-10-CM | POA: Diagnosis not present

## 2021-09-28 DIAGNOSIS — I872 Venous insufficiency (chronic) (peripheral): Secondary | ICD-10-CM | POA: Diagnosis not present

## 2021-09-29 DIAGNOSIS — L97222 Non-pressure chronic ulcer of left calf with fat layer exposed: Secondary | ICD-10-CM | POA: Diagnosis not present

## 2021-09-29 DIAGNOSIS — L97212 Non-pressure chronic ulcer of right calf with fat layer exposed: Secondary | ICD-10-CM | POA: Diagnosis not present

## 2021-09-29 DIAGNOSIS — I10 Essential (primary) hypertension: Secondary | ICD-10-CM | POA: Diagnosis not present

## 2021-09-29 DIAGNOSIS — K219 Gastro-esophageal reflux disease without esophagitis: Secondary | ICD-10-CM | POA: Diagnosis not present

## 2021-09-29 DIAGNOSIS — E1151 Type 2 diabetes mellitus with diabetic peripheral angiopathy without gangrene: Secondary | ICD-10-CM | POA: Diagnosis not present

## 2021-09-29 DIAGNOSIS — I83018 Varicose veins of right lower extremity with ulcer other part of lower leg: Secondary | ICD-10-CM | POA: Diagnosis not present

## 2021-09-29 DIAGNOSIS — F0390 Unspecified dementia without behavioral disturbance: Secondary | ICD-10-CM | POA: Diagnosis not present

## 2021-09-29 DIAGNOSIS — I83028 Varicose veins of left lower extremity with ulcer other part of lower leg: Secondary | ICD-10-CM | POA: Diagnosis not present

## 2021-10-02 DIAGNOSIS — L97222 Non-pressure chronic ulcer of left calf with fat layer exposed: Secondary | ICD-10-CM | POA: Diagnosis not present

## 2021-10-02 DIAGNOSIS — I83018 Varicose veins of right lower extremity with ulcer other part of lower leg: Secondary | ICD-10-CM | POA: Diagnosis not present

## 2021-10-02 DIAGNOSIS — I83028 Varicose veins of left lower extremity with ulcer other part of lower leg: Secondary | ICD-10-CM | POA: Diagnosis not present

## 2021-10-02 DIAGNOSIS — L97212 Non-pressure chronic ulcer of right calf with fat layer exposed: Secondary | ICD-10-CM | POA: Diagnosis not present

## 2021-10-02 DIAGNOSIS — I10 Essential (primary) hypertension: Secondary | ICD-10-CM | POA: Diagnosis not present

## 2021-10-02 DIAGNOSIS — E1151 Type 2 diabetes mellitus with diabetic peripheral angiopathy without gangrene: Secondary | ICD-10-CM | POA: Diagnosis not present

## 2021-10-03 DIAGNOSIS — B029 Zoster without complications: Secondary | ICD-10-CM | POA: Diagnosis not present

## 2021-10-05 DIAGNOSIS — L97212 Non-pressure chronic ulcer of right calf with fat layer exposed: Secondary | ICD-10-CM | POA: Diagnosis not present

## 2021-10-05 DIAGNOSIS — I83892 Varicose veins of left lower extremities with other complications: Secondary | ICD-10-CM | POA: Diagnosis not present

## 2021-10-05 DIAGNOSIS — L97929 Non-pressure chronic ulcer of unspecified part of left lower leg with unspecified severity: Secondary | ICD-10-CM | POA: Diagnosis not present

## 2021-10-05 DIAGNOSIS — R609 Edema, unspecified: Secondary | ICD-10-CM | POA: Diagnosis not present

## 2021-10-05 DIAGNOSIS — I83891 Varicose veins of right lower extremities with other complications: Secondary | ICD-10-CM | POA: Diagnosis not present

## 2021-10-05 DIAGNOSIS — I83019 Varicose veins of right lower extremity with ulcer of unspecified site: Secondary | ICD-10-CM | POA: Diagnosis not present

## 2021-10-05 DIAGNOSIS — I83029 Varicose veins of left lower extremity with ulcer of unspecified site: Secondary | ICD-10-CM | POA: Diagnosis not present

## 2021-10-05 DIAGNOSIS — I872 Venous insufficiency (chronic) (peripheral): Secondary | ICD-10-CM | POA: Diagnosis not present

## 2021-10-05 DIAGNOSIS — L97222 Non-pressure chronic ulcer of left calf with fat layer exposed: Secondary | ICD-10-CM | POA: Diagnosis not present

## 2021-10-05 DIAGNOSIS — L97919 Non-pressure chronic ulcer of unspecified part of right lower leg with unspecified severity: Secondary | ICD-10-CM | POA: Diagnosis not present

## 2021-10-09 DIAGNOSIS — I83028 Varicose veins of left lower extremity with ulcer other part of lower leg: Secondary | ICD-10-CM | POA: Diagnosis not present

## 2021-10-09 DIAGNOSIS — L97212 Non-pressure chronic ulcer of right calf with fat layer exposed: Secondary | ICD-10-CM | POA: Diagnosis not present

## 2021-10-09 DIAGNOSIS — L97222 Non-pressure chronic ulcer of left calf with fat layer exposed: Secondary | ICD-10-CM | POA: Diagnosis not present

## 2021-10-09 DIAGNOSIS — I10 Essential (primary) hypertension: Secondary | ICD-10-CM | POA: Diagnosis not present

## 2021-10-09 DIAGNOSIS — E1151 Type 2 diabetes mellitus with diabetic peripheral angiopathy without gangrene: Secondary | ICD-10-CM | POA: Diagnosis not present

## 2021-10-09 DIAGNOSIS — I83018 Varicose veins of right lower extremity with ulcer other part of lower leg: Secondary | ICD-10-CM | POA: Diagnosis not present

## 2021-10-12 ENCOUNTER — Encounter: Payer: Self-pay | Admitting: Internal Medicine

## 2021-10-12 DIAGNOSIS — I83891 Varicose veins of right lower extremities with other complications: Secondary | ICD-10-CM | POA: Diagnosis not present

## 2021-10-12 DIAGNOSIS — L97222 Non-pressure chronic ulcer of left calf with fat layer exposed: Secondary | ICD-10-CM | POA: Diagnosis not present

## 2021-10-12 DIAGNOSIS — I83892 Varicose veins of left lower extremities with other complications: Secondary | ICD-10-CM | POA: Diagnosis not present

## 2021-10-12 DIAGNOSIS — I83029 Varicose veins of left lower extremity with ulcer of unspecified site: Secondary | ICD-10-CM | POA: Diagnosis not present

## 2021-10-12 DIAGNOSIS — I83019 Varicose veins of right lower extremity with ulcer of unspecified site: Secondary | ICD-10-CM | POA: Diagnosis not present

## 2021-10-12 DIAGNOSIS — R609 Edema, unspecified: Secondary | ICD-10-CM | POA: Diagnosis not present

## 2021-10-12 DIAGNOSIS — L97929 Non-pressure chronic ulcer of unspecified part of left lower leg with unspecified severity: Secondary | ICD-10-CM | POA: Diagnosis not present

## 2021-10-12 DIAGNOSIS — L97919 Non-pressure chronic ulcer of unspecified part of right lower leg with unspecified severity: Secondary | ICD-10-CM | POA: Diagnosis not present

## 2021-10-12 DIAGNOSIS — L97212 Non-pressure chronic ulcer of right calf with fat layer exposed: Secondary | ICD-10-CM | POA: Diagnosis not present

## 2021-10-12 DIAGNOSIS — I872 Venous insufficiency (chronic) (peripheral): Secondary | ICD-10-CM | POA: Diagnosis not present

## 2021-10-16 DIAGNOSIS — I83018 Varicose veins of right lower extremity with ulcer other part of lower leg: Secondary | ICD-10-CM | POA: Diagnosis not present

## 2021-10-16 DIAGNOSIS — L97212 Non-pressure chronic ulcer of right calf with fat layer exposed: Secondary | ICD-10-CM | POA: Diagnosis not present

## 2021-10-16 DIAGNOSIS — I10 Essential (primary) hypertension: Secondary | ICD-10-CM | POA: Diagnosis not present

## 2021-10-16 DIAGNOSIS — L97222 Non-pressure chronic ulcer of left calf with fat layer exposed: Secondary | ICD-10-CM | POA: Diagnosis not present

## 2021-10-16 DIAGNOSIS — E1151 Type 2 diabetes mellitus with diabetic peripheral angiopathy without gangrene: Secondary | ICD-10-CM | POA: Diagnosis not present

## 2021-10-16 DIAGNOSIS — I83028 Varicose veins of left lower extremity with ulcer other part of lower leg: Secondary | ICD-10-CM | POA: Diagnosis not present

## 2021-10-20 DIAGNOSIS — I10 Essential (primary) hypertension: Secondary | ICD-10-CM | POA: Diagnosis not present

## 2021-10-20 DIAGNOSIS — I83028 Varicose veins of left lower extremity with ulcer other part of lower leg: Secondary | ICD-10-CM | POA: Diagnosis not present

## 2021-10-20 DIAGNOSIS — L97212 Non-pressure chronic ulcer of right calf with fat layer exposed: Secondary | ICD-10-CM | POA: Diagnosis not present

## 2021-10-20 DIAGNOSIS — I83018 Varicose veins of right lower extremity with ulcer other part of lower leg: Secondary | ICD-10-CM | POA: Diagnosis not present

## 2021-10-20 DIAGNOSIS — E1151 Type 2 diabetes mellitus with diabetic peripheral angiopathy without gangrene: Secondary | ICD-10-CM | POA: Diagnosis not present

## 2021-10-20 DIAGNOSIS — L97222 Non-pressure chronic ulcer of left calf with fat layer exposed: Secondary | ICD-10-CM | POA: Diagnosis not present

## 2021-10-23 DIAGNOSIS — L97222 Non-pressure chronic ulcer of left calf with fat layer exposed: Secondary | ICD-10-CM | POA: Diagnosis not present

## 2021-10-23 DIAGNOSIS — I83018 Varicose veins of right lower extremity with ulcer other part of lower leg: Secondary | ICD-10-CM | POA: Diagnosis not present

## 2021-10-23 DIAGNOSIS — L97212 Non-pressure chronic ulcer of right calf with fat layer exposed: Secondary | ICD-10-CM | POA: Diagnosis not present

## 2021-10-23 DIAGNOSIS — E1151 Type 2 diabetes mellitus with diabetic peripheral angiopathy without gangrene: Secondary | ICD-10-CM | POA: Diagnosis not present

## 2021-10-23 DIAGNOSIS — I83028 Varicose veins of left lower extremity with ulcer other part of lower leg: Secondary | ICD-10-CM | POA: Diagnosis not present

## 2021-10-23 DIAGNOSIS — I10 Essential (primary) hypertension: Secondary | ICD-10-CM | POA: Diagnosis not present

## 2021-10-26 DIAGNOSIS — I890 Lymphedema, not elsewhere classified: Secondary | ICD-10-CM | POA: Diagnosis not present

## 2021-10-26 DIAGNOSIS — L97822 Non-pressure chronic ulcer of other part of left lower leg with fat layer exposed: Secondary | ICD-10-CM | POA: Diagnosis not present

## 2021-10-26 DIAGNOSIS — I83028 Varicose veins of left lower extremity with ulcer other part of lower leg: Secondary | ICD-10-CM | POA: Diagnosis not present

## 2021-10-26 DIAGNOSIS — I83018 Varicose veins of right lower extremity with ulcer other part of lower leg: Secondary | ICD-10-CM | POA: Diagnosis not present

## 2021-10-26 DIAGNOSIS — L97812 Non-pressure chronic ulcer of other part of right lower leg with fat layer exposed: Secondary | ICD-10-CM | POA: Diagnosis not present

## 2021-10-26 DIAGNOSIS — I83893 Varicose veins of bilateral lower extremities with other complications: Secondary | ICD-10-CM | POA: Diagnosis not present

## 2021-10-31 DIAGNOSIS — H401133 Primary open-angle glaucoma, bilateral, severe stage: Secondary | ICD-10-CM | POA: Diagnosis not present

## 2021-11-02 DIAGNOSIS — R609 Edema, unspecified: Secondary | ICD-10-CM | POA: Diagnosis not present

## 2021-11-02 DIAGNOSIS — I83029 Varicose veins of left lower extremity with ulcer of unspecified site: Secondary | ICD-10-CM | POA: Diagnosis not present

## 2021-11-02 DIAGNOSIS — I872 Venous insufficiency (chronic) (peripheral): Secondary | ICD-10-CM | POA: Diagnosis not present

## 2021-11-02 DIAGNOSIS — I83019 Varicose veins of right lower extremity with ulcer of unspecified site: Secondary | ICD-10-CM | POA: Diagnosis not present

## 2021-11-02 DIAGNOSIS — L97919 Non-pressure chronic ulcer of unspecified part of right lower leg with unspecified severity: Secondary | ICD-10-CM | POA: Diagnosis not present

## 2021-11-02 DIAGNOSIS — L97222 Non-pressure chronic ulcer of left calf with fat layer exposed: Secondary | ICD-10-CM | POA: Diagnosis not present

## 2021-11-02 DIAGNOSIS — I83893 Varicose veins of bilateral lower extremities with other complications: Secondary | ICD-10-CM | POA: Diagnosis not present

## 2021-11-02 DIAGNOSIS — I83891 Varicose veins of right lower extremities with other complications: Secondary | ICD-10-CM | POA: Diagnosis not present

## 2021-11-02 DIAGNOSIS — L97929 Non-pressure chronic ulcer of unspecified part of left lower leg with unspecified severity: Secondary | ICD-10-CM | POA: Diagnosis not present

## 2021-11-02 DIAGNOSIS — I83892 Varicose veins of left lower extremities with other complications: Secondary | ICD-10-CM | POA: Diagnosis not present

## 2021-11-02 DIAGNOSIS — L97212 Non-pressure chronic ulcer of right calf with fat layer exposed: Secondary | ICD-10-CM | POA: Diagnosis not present

## 2021-11-09 DIAGNOSIS — I83893 Varicose veins of bilateral lower extremities with other complications: Secondary | ICD-10-CM | POA: Diagnosis not present

## 2021-11-09 DIAGNOSIS — L97919 Non-pressure chronic ulcer of unspecified part of right lower leg with unspecified severity: Secondary | ICD-10-CM | POA: Diagnosis not present

## 2021-11-09 DIAGNOSIS — I83029 Varicose veins of left lower extremity with ulcer of unspecified site: Secondary | ICD-10-CM | POA: Diagnosis not present

## 2021-11-09 DIAGNOSIS — I872 Venous insufficiency (chronic) (peripheral): Secondary | ICD-10-CM | POA: Diagnosis not present

## 2021-11-09 DIAGNOSIS — I83892 Varicose veins of left lower extremities with other complications: Secondary | ICD-10-CM | POA: Diagnosis not present

## 2021-11-09 DIAGNOSIS — I83019 Varicose veins of right lower extremity with ulcer of unspecified site: Secondary | ICD-10-CM | POA: Diagnosis not present

## 2021-11-09 DIAGNOSIS — R609 Edema, unspecified: Secondary | ICD-10-CM | POA: Diagnosis not present

## 2021-11-09 DIAGNOSIS — I83891 Varicose veins of right lower extremities with other complications: Secondary | ICD-10-CM | POA: Diagnosis not present

## 2021-11-09 DIAGNOSIS — L97222 Non-pressure chronic ulcer of left calf with fat layer exposed: Secondary | ICD-10-CM | POA: Diagnosis not present

## 2021-11-09 DIAGNOSIS — L97929 Non-pressure chronic ulcer of unspecified part of left lower leg with unspecified severity: Secondary | ICD-10-CM | POA: Diagnosis not present

## 2021-11-09 DIAGNOSIS — L97212 Non-pressure chronic ulcer of right calf with fat layer exposed: Secondary | ICD-10-CM | POA: Diagnosis not present

## 2021-11-23 DIAGNOSIS — L97919 Non-pressure chronic ulcer of unspecified part of right lower leg with unspecified severity: Secondary | ICD-10-CM | POA: Diagnosis not present

## 2021-11-23 DIAGNOSIS — I83029 Varicose veins of left lower extremity with ulcer of unspecified site: Secondary | ICD-10-CM | POA: Diagnosis not present

## 2021-11-23 DIAGNOSIS — L97222 Non-pressure chronic ulcer of left calf with fat layer exposed: Secondary | ICD-10-CM | POA: Diagnosis not present

## 2021-11-23 DIAGNOSIS — L97212 Non-pressure chronic ulcer of right calf with fat layer exposed: Secondary | ICD-10-CM | POA: Diagnosis not present

## 2021-11-23 DIAGNOSIS — I83019 Varicose veins of right lower extremity with ulcer of unspecified site: Secondary | ICD-10-CM | POA: Diagnosis not present

## 2021-11-23 DIAGNOSIS — I83892 Varicose veins of left lower extremities with other complications: Secondary | ICD-10-CM | POA: Diagnosis not present

## 2021-11-23 DIAGNOSIS — R609 Edema, unspecified: Secondary | ICD-10-CM | POA: Diagnosis not present

## 2021-11-23 DIAGNOSIS — L97929 Non-pressure chronic ulcer of unspecified part of left lower leg with unspecified severity: Secondary | ICD-10-CM | POA: Diagnosis not present

## 2021-11-23 DIAGNOSIS — I83891 Varicose veins of right lower extremities with other complications: Secondary | ICD-10-CM | POA: Diagnosis not present

## 2021-11-23 DIAGNOSIS — I872 Venous insufficiency (chronic) (peripheral): Secondary | ICD-10-CM | POA: Diagnosis not present

## 2022-01-19 ENCOUNTER — Other Ambulatory Visit: Payer: Self-pay | Admitting: Cardiovascular Disease

## 2022-02-13 ENCOUNTER — Other Ambulatory Visit: Payer: Self-pay

## 2023-01-17 ENCOUNTER — Other Ambulatory Visit: Payer: Self-pay | Admitting: Cardiovascular Disease

## 2023-02-16 ENCOUNTER — Other Ambulatory Visit: Payer: Self-pay | Admitting: Cardiovascular Disease

## 2023-12-28 DEATH — deceased
